# Patient Record
Sex: Female | Born: 1950 | Race: White | Hispanic: No | Smoking: Never smoker
Health system: Southern US, Community
[De-identification: ages and names within clinical notes are randomized; demographics above are authoritative.]

## PROBLEM LIST (undated history)

## (undated) DIAGNOSIS — D72819 Decreased white blood cell count, unspecified: Secondary | ICD-10-CM

## (undated) DIAGNOSIS — F329 Major depressive disorder, single episode, unspecified: Secondary | ICD-10-CM

## (undated) DIAGNOSIS — C9000 Multiple myeloma not having achieved remission: Secondary | ICD-10-CM

## (undated) DIAGNOSIS — F32A Depression, unspecified: Secondary | ICD-10-CM

## (undated) DIAGNOSIS — R51 Headache: Secondary | ICD-10-CM

## (undated) HISTORY — DX: Multiple myeloma not having achieved remission: C90.00

---

## 1977-02-17 HISTORY — PX: LAPAROSCOPIC ENDOMETRIOSIS FULGURATION: SUR769

## 2006-04-18 LAB — CONVERTED CEMR LAB: Pap Smear: NORMAL

## 2007-09-18 HISTORY — PX: CATARACT EXTRACTION: SUR2

## 2008-06-14 ENCOUNTER — Encounter: Payer: Self-pay | Admitting: Family Medicine

## 2008-06-24 ENCOUNTER — Emergency Department (HOSPITAL_BASED_OUTPATIENT_CLINIC_OR_DEPARTMENT_OTHER): Admission: EM | Admit: 2008-06-24 | Discharge: 2008-06-24 | Payer: Self-pay | Admitting: Emergency Medicine

## 2008-06-25 ENCOUNTER — Emergency Department (HOSPITAL_BASED_OUTPATIENT_CLINIC_OR_DEPARTMENT_OTHER): Admission: EM | Admit: 2008-06-25 | Discharge: 2008-06-25 | Payer: Self-pay | Admitting: Emergency Medicine

## 2008-12-19 ENCOUNTER — Encounter: Payer: Self-pay | Admitting: Family Medicine

## 2008-12-29 ENCOUNTER — Ambulatory Visit: Payer: Self-pay | Admitting: Family Medicine

## 2008-12-29 ENCOUNTER — Encounter: Payer: Self-pay | Admitting: Family Medicine

## 2008-12-29 ENCOUNTER — Other Ambulatory Visit: Admission: RE | Admit: 2008-12-29 | Discharge: 2008-12-29 | Payer: Self-pay | Admitting: Family Medicine

## 2008-12-29 DIAGNOSIS — G43909 Migraine, unspecified, not intractable, without status migrainosus: Secondary | ICD-10-CM | POA: Insufficient documentation

## 2009-01-01 LAB — CONVERTED CEMR LAB
ALT: 17 units/L (ref 0–35)
Albumin: 4.6 g/dL (ref 3.5–5.2)
BUN: 14 mg/dL (ref 6–23)
CO2: 23 meq/L (ref 19–32)
Calcium: 9.5 mg/dL (ref 8.4–10.5)
Chloride: 106 meq/L (ref 96–112)
Cholesterol, target level: 200 mg/dL
Cholesterol: 207 mg/dL — ABNORMAL HIGH (ref 0–200)
Creatinine, Ser: 0.7 mg/dL (ref 0.40–1.20)
HCT: 42.7 % (ref 36.0–46.0)
HDL goal, serum: 40 mg/dL
Hemoglobin: 13.6 g/dL (ref 12.0–15.0)
Platelets: 322 10*3/uL (ref 150–400)
Potassium: 4.3 meq/L (ref 3.5–5.3)
Total CHOL/HDL Ratio: 4.1
WBC: 4.9 10*3/uL (ref 4.0–10.5)

## 2009-01-02 LAB — CONVERTED CEMR LAB: Pap Smear: NORMAL

## 2009-01-08 ENCOUNTER — Encounter: Admission: RE | Admit: 2009-01-08 | Discharge: 2009-01-08 | Payer: Self-pay | Admitting: Family Medicine

## 2009-01-08 DIAGNOSIS — M810 Age-related osteoporosis without current pathological fracture: Secondary | ICD-10-CM | POA: Insufficient documentation

## 2009-03-07 ENCOUNTER — Ambulatory Visit: Payer: Self-pay | Admitting: Family Medicine

## 2010-03-19 NOTE — Assessment & Plan Note (Signed)
Summary: Cat bite - R thumb x this am rm 2   Vital Signs:  Patient Profile:   60 Years Old Female CC:      Cat bite - x this am Height:     70 inches Weight:      184 pounds O2 Sat:      100 % O2 treatment:    Room Air Temp:     97.3 degrees F oral Pulse rate:   64 / minute Pulse rhythm:   regular Resp:     16 per minute BP sitting:   127 / 81  (right arm) Cuff size:   regular  Vitals Entered By: Areta Haber CMA (March 07, 2009 6:53 PM)                  Prior Medication List:  DICYCLOMINE HCL 20 MG TABS (DICYCLOMINE HCL) Take 1 tab by mouth once daily as needed * HOLY BASIL  * SPLEEN ACTIVATOR  * PROLAMINE IODINE  * CARDIO PLUS  * MAGNESIUM  * GIA  * MANGANESE  * MULTIMINERAL  CALCIUM CARBONATE-VITAMIN D 600-200 MG-UNIT TABS (CALCIUM CARBONATE-VITAMIN D) Take 1 tablet by mouth two times a day BONIVA 150 MG TABS (IBANDRONATE SODIUM) one by mouth once a month   Current Allergies (reviewed today): ! * LATEX    History of Present Illness Chief Complaint: Cat bite - x this am History of Present Illness: Patient was bitten by her cat this morning. Patient cat has bitten patient before. His immunization are up to date according to patient. The patient immunizations are up to date.  Current Problems: BITE OF OTHER ANIMAL EXCEPT ARTHROPOD (ICD-E906.3) OSTEOPOROSIS (ICD-733.00) ROUTINE GYNECOLOGICAL EXAMINATION (ICD-V72.31) MIGRAINE HEADACHE (ICD-346.90)   Current Meds DICYCLOMINE HCL 20 MG TABS (DICYCLOMINE HCL) Take 1 tab by mouth once daily as needed * HOLY BASIL  * SPLEEN ACTIVATOR  * PROLAMINE IODINE  * CARDIO PLUS  * MAGNESIUM  * GIA  * MANGANESE  * MULTIMINERAL  CALCIUM CARBONATE-VITAMIN D 600-200 MG-UNIT TABS (CALCIUM CARBONATE-VITAMIN D) Take 1 tablet by mouth two times a day BONIVA 150 MG TABS (IBANDRONATE SODIUM) one by mouth once a month TREXIMET 85-500 MG TABS (SUMATRIPTAN-NAPROXEN SODIUM) as needed PROPRANOLOL HCL 10 MG TABS (PROPRANOLOL  HCL) 1 tab two times a day DICYCLOMINE HCL 20 MG TABS (DICYCLOMINE HCL) 1 tab by mouth qid as needed AUGMENTIN 875-125 MG TABS (AMOXICILLIN-POT CLAVULANATE) 1 by mouth 2 times daily  REVIEW OF SYSTEMS Constitutional Symptoms      Denies fever, chills, night sweats, weight loss, weight gain, and fatigue.  Eyes       Denies change in vision, eye pain, eye discharge, glasses, contact lenses, and eye surgery. Ear/Nose/Throat/Mouth       Denies hearing loss/aids, change in hearing, ear pain, ear discharge, dizziness, frequent runny nose, frequent nose bleeds, sinus problems, sore throat, hoarseness, and tooth pain or bleeding.  Respiratory       Denies dry cough, productive cough, wheezing, shortness of breath, asthma, bronchitis, and emphysema/COPD.  Cardiovascular       Denies murmurs, chest pain, and tires easily with exhertion.    Gastrointestinal       Denies stomach pain, nausea/vomiting, diarrhea, constipation, blood in bowel movements, and indigestion. Genitourniary       Denies painful urination, kidney stones, and loss of urinary control. Neurological       Denies paralysis, seizures, and fainting/blackouts. Musculoskeletal       Denies muscle pain, joint pain,  joint stiffness, decreased range of motion, redness, swelling, muscle weakness, and gout.  Skin       Denies bruising, unusual mles/lumps or sores, and hair/skin or nail changes.      Comments: Cat bite this am- R thumb Psych       Denies mood changes, temper/anger issues, anxiety/stress, speech problems, depression, and sleep problems. Other Comments: Pt states that she is update with her Tdap.   Past History:  Past Surgical History: Last updated: 12/29/2008 Endometriosis 1979 Cataract 09/2007  Family History: Last updated: 12/29/2008 Cancer, MI Sister with hi chol, HTN GM and aunt with brCa.   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  Never Smoked Alcohol use-no Drug use-no Regular exercise-yes  Risk Factors: Exercise: yes (12/29/2008)  Risk Factors: Smoking Status: never (12/29/2008)  Family History: Reviewed history from 12/29/2008 and no changes required. Cancer, MI Sister with hi chol, HTN GM and aunt with brCa.   Father with colon adenoma  Social History: Reviewed history from 12/29/2008 and no changes required. 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  Never Smoked Alcohol use-no Drug use-no Regular exercise-yes Physical Exam General appearance: well developed, well nourished, no acute distress Head: normocephalic, atraumatic Extremities: Puncture wound w/ swelling and claw marks on the R forearm. Skin: no obvious rashes or lesions MSE: oriented to time, place, and person Assessment New Problems: BITE OF OTHER ANIMAL EXCEPT ARTHROPOD (ICD-E906.3)  cat bite  Plan New Medications/Changes: AUGMENTIN 875-125 MG TABS (AMOXICILLIN-POT CLAVULANATE) 1 by mouth 2 times daily  #20 x 0, 03/07/2009, Hassan Rowan MD  New Orders: New Patient Level III [phone] Planning Comments:   as below  Follow Up: Follow up in 2-3 days if no improvement, Follow up on an as needed basis, Follow up with Primary Physician  The patient and/or caregiver has been counseled thoroughly with regard to medications prescribed including dosage, schedule, interactions, rationale for use, and possible side effects and they verbalize understanding.  Diagnoses and expected course of recovery discussed and will return if not improved as expected or if the condition worsens. Patient and/or caregiver verbalized understanding.  Prescriptions: AUGMENTIN 875-125 MG TABS (AMOXICILLIN-POT CLAVULANATE) 1 by mouth 2 times daily  #20 x 0   Entered and Authorized by:   Hassan Rowan MD   Signed by:   Hassan Rowan MD on 03/07/2009   Method used:   Printed then faxed to ...        [facility] [address] # [phone]* (retail)       [address] [place], [place]  [address]       Ph: [phone]       Fax: [phone]   RxID:   [id number]   Patient Instructions: 1)  Please schedule a follow-up appointment as needed. 2)  Please schedule an appointment with your primary doctor in :as needed 3)  Take your antibiotic as prescribed until ALL of it is gone, but stop if you develop a rash or swelling and contact our office as soon as possible.

## 2010-05-14 ENCOUNTER — Telehealth: Payer: Self-pay | Admitting: Family Medicine

## 2010-05-14 NOTE — Telephone Encounter (Signed)
Nancy Parks with [employer] is requesting pt's BMI from 12/29/2008 OV, his phone # is [phone] his fax is [phone]

## 2010-05-20 NOTE — Telephone Encounter (Signed)
This info has been faxed already

## 2010-07-12 ENCOUNTER — Inpatient Hospital Stay (INDEPENDENT_AMBULATORY_CARE_PROVIDER_SITE_OTHER)
Admission: RE | Admit: 2010-07-12 | Discharge: 2010-07-12 | Disposition: A | Discharge status: Home / Self Care | Payer: Managed Care, Other (non HMO) | Source: Ambulatory Visit | Attending: Family Medicine | Admitting: Family Medicine

## 2010-07-12 ENCOUNTER — Encounter: Payer: Self-pay | Admitting: Family Medicine

## 2010-07-12 DIAGNOSIS — Z23 Encounter for immunization: Secondary | ICD-10-CM

## 2010-07-12 DIAGNOSIS — S60229A Contusion of unspecified hand, initial encounter: Secondary | ICD-10-CM

## 2010-07-12 DIAGNOSIS — IMO0001 Reserved for inherently not codable concepts without codable children: Secondary | ICD-10-CM

## 2010-07-14 ENCOUNTER — Telehealth (INDEPENDENT_AMBULATORY_CARE_PROVIDER_SITE_OTHER): Payer: Self-pay | Admitting: Emergency Medicine

## 2010-07-16 ENCOUNTER — Telehealth (INDEPENDENT_AMBULATORY_CARE_PROVIDER_SITE_OTHER): Payer: Self-pay | Admitting: *Deleted

## 2011-01-20 NOTE — Telephone Encounter (Signed)
  Phone Note Outgoing Call   Call placed by: Lavell Islam RN,  Jul 14, 2010 5:47 PM Call placed to: Patient Action Taken: Phone Call Completed Summary of Call: Left message inquiring about patient's cat bite area; encouraged her to call us with any questions/concerns. Initial call taken by: Lavell Islam RN,  Jul 14, 2010 5:48 PM

## 2011-01-20 NOTE — Progress Notes (Signed)
Summary: right hand cat bite rm 5   Vital Signs:  Patient Profile:   60 Years Old Female CC:      cat bite to RT hand x today Height:     70 inches Weight:      184.50 pounds O2 Sat:      97 % O2 treatment:    Room Air Temp:     98.7 degrees F oral Pulse rate:   89 / minute Resp:     18 per minute BP sitting:   128 / 77  (left arm) Cuff size:   regular  Vitals Entered By: Clemens Catholic LPN (Jul 12, 2010 4:10 PM)                  Updated Prior Medication List: * HOLY BASIL  * SPLEEN ACTIVATOR  * PROLAMINE IODINE  * CARDIO PLUS  * MAGNESIUM  * GIA  * MANGANESE  * MULTIMINERAL  CALCIUM CARBONATE-VITAMIN D 600-200 MG-UNIT TABS (CALCIUM CARBONATE-VITAMIN D) Take 1 tablet by mouth two times a day BONIVA 150 MG TABS (IBANDRONATE SODIUM) one by mouth once a month  Current Allergies (reviewed today): ! * LATEXHistory of Present Illness Chief Complaint: cat bite to RT hand x today History of Present Illness:  Subjective:  Patient was handling her cat this morning and startled him; he bit her on her right wrist.  She washed the area and applied antibiotic, but the area later became red and tender.  She has had some tingling in her right hand.  The cat has not been ill.  REVIEW OF SYSTEMS Constitutional Symptoms      Denies fever, chills, night sweats, weight loss, weight gain, and fatigue.  Eyes       Denies change in vision, eye pain, eye discharge, glasses, contact lenses, and eye surgery. Ear/Nose/Throat/Mouth       Denies hearing loss/aids, change in hearing, ear pain, ear discharge, dizziness, frequent runny nose, frequent nose bleeds, sinus problems, sore throat, hoarseness, and tooth pain or bleeding.  Respiratory       Denies dry cough, productive cough, wheezing, shortness of breath, asthma, bronchitis, and emphysema/COPD.  Cardiovascular       Denies murmurs, chest pain, and tires easily with exhertion.    Gastrointestinal       Denies stomach pain,  nausea/vomiting, diarrhea, constipation, blood in bowel movements, and indigestion. Genitourniary       Denies painful urination, kidney stones, and loss of urinary control. Neurological       Denies paralysis, seizures, and fainting/blackouts. Musculoskeletal       Complains of redness and swelling.      Denies muscle pain, joint pain, joint stiffness, decreased range of motion, muscle weakness, and gout.  Skin       Denies bruising, unusual mles/lumps or sores, and hair/skin or nail changes.  Psych       Denies mood changes, temper/anger issues, anxiety/stress, speech problems, depression, and sleep problems. Other Comments: pt states that her cat bit her RT hand/ wrist area this morning. she has redness and a little swelling around the area.she has some tingling in her hand.  her TD is update per the pt.  Reported to the [facility] Control (Hank Sowers).   Past History:  Past Surgical History: Reviewed history from 12/29/2008 and no changes required. Endometriosis 1979 Cataract 09/2007  Family History: Cancer, MI, CVA Sister with hi chol, HTN, throat CA GM and aunt with brCa.   Father  with colon adenoma  Social History: Reviewed history from 12/29/2008 and no changes required. 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  Never Smoked Alcohol use-no Drug use-no Regular exercise-yes   Objective:  Appearance:  Patient appears healthy, stated age, and in no acute distress  Right wrist:  2 small abrasions on dorsum with surrounding erythema and tenderness.  Proximal to these lesions is a small abrasion on forearm without erythema.  Wrist has full range of motion.  Distal neurovascular intact  Assessment New Problems: CAT BITE (ICD-E906.3)   Plan New Medications/Changes: AUGMENTIN 875-125 MG TABS (AMOXICILLIN-POT CLAVULANATE) One by mouth q12hr pc  #14 x 0, 07/12/2010, Neya Christen MD  New Orders: Tdap => 22yrs IM  [90715] Est. Patient Level III [99213] Admin of Injection (IM/SQ) [14782] Admin 1st Vaccine [90471] Planning Comments:   Applied Bacitracin and bandage.  Begin Augmentin for one week.  Tdap given.  May take Ibuprofen 200mg , 4 tabs every 8 hours with food.  Change bandage daily until healed. Animal control contacted Return if not improving 2 to 3 days.   The patient and/or caregiver has been counseled thoroughly with regard to medications prescribed including dosage, schedule, interactions, rationale for use, and possible side effects and they verbalize understanding.  Diagnoses and expected course of recovery discussed and will return if not improved as expected or if the condition worsens. Patient and/or caregiver verbalized understanding.  Prescriptions: AUGMENTIN 875-125 MG TABS (AMOXICILLIN-POT CLAVULANATE) One by mouth q12hr pc  #14 x 0   Entered and Authorized by:   Ricquel Christen MD   Signed by:   Celsa Christen MD on 07/12/2010   Method used:   Print then Give to Patient   RxID:   [id number]   Patient Instructions: 1)  Take Ibuprofen 200mg , 4 tabs every 8 hours with food  2)  Apply Bacitracin and bandage daily for several days until healed.  Orders Added: 1)  Tdap => 55yrs IM [90715] 2)  Est. Patient Level III [29528] 3)  Admin of Injection (IM/SQ) [41324] 4)  Admin 1st Vaccine [40102]   Immunizations Administered:  Tetanus Vaccine:    Vaccine Type: Tdap    Site: right deltoid    Mfr: GlaxoSmithKline    Dose: 0.5 ml    Route: IM    Given by: Clemens Catholic LPN    Exp. Date: 12/07/2011    Lot #: [id number]    VIS given: 01/05/08 version given Jul 12, 2010.   Immunizations Administered:  Tetanus Vaccine:    Vaccine Type: Tdap    Site: right deltoid    Mfr: GlaxoSmithKline    Dose: 0.5 ml    Route: IM    Given by: Clemens Catholic LPN    Exp. Date: 12/07/2011    Lot #: [id number]    VIS given: 01/05/08 version given Jul 12, 2010.

## 2011-01-20 NOTE — Telephone Encounter (Signed)
  Phone Note Call from Patient   Caller: Patient Summary of Call: Patient called reporting mild tingling in her 3rd and 4th fingers for a couple days. After talking with Dr. Orson Aloe I told her it was probably from the swelling. Use ice, elevation and ibuprofen. She reports she is still taking the antibiotic and the redness is decreasing. Call back if tingling does not subside. Patient understood. Initial call taken by: Lajean Saver RN,  Jul 16, 2010 9:23 AM

## 2011-02-04 ENCOUNTER — Ambulatory Visit (INDEPENDENT_AMBULATORY_CARE_PROVIDER_SITE_OTHER): Payer: Managed Care, Other (non HMO) | Admitting: Family Medicine

## 2011-02-04 ENCOUNTER — Encounter: Payer: Self-pay | Admitting: Family Medicine

## 2011-02-04 DIAGNOSIS — F32A Depression, unspecified: Secondary | ICD-10-CM | POA: Insufficient documentation

## 2011-02-04 DIAGNOSIS — F329 Major depressive disorder, single episode, unspecified: Secondary | ICD-10-CM

## 2011-02-04 MED ORDER — CITALOPRAM HYDROBROMIDE 20 MG PO TABS: ORAL_TABLET | ORAL | Status: DC

## 2011-02-04 MED ORDER — ALPRAZOLAM 0.5 MG PO TABS: 0.5000 mg | ORAL_TABLET | Freq: Every evening | ORAL | Status: AC | PRN

## 2011-02-04 NOTE — Progress Notes (Signed)
  Subjective:    Patient ID: Nancy Parks, female    DOB: 16-Feb-1951, 60 y.o.   MRN: [id number]  HPI Started in early November. Says husband left her in May but then in November found out husband was cheating for 10 years.  Had been married for 20 years. Started seeing a counselor and they recommended medication. Having a hard time focusing. She is a Patent examiner for a living. More migraines when she has to deal with him. She is not suicidal.  She is now lonely and struggling financially. She feels shakey inside.  Not sleep well. Waking up frequently.  Dec energy but racing thoughts. Does have friend support.    Review of Systems     Objective:   Physical Exam  Constitutional: She is oriented to person, place, and time. She appears well-developed and well-nourished.  HENT:  Head: Normocephalic and atraumatic.  Cardiovascular: Normal rate, regular rhythm and normal heart sounds.   Pulmonary/Chest: Effort normal and breath sounds normal.  Neurological: She is alert and oriented to person, place, and time.  Skin: Skin is warm and dry.  Psychiatric: She has a normal mood and affect. Her behavior is normal.          Assessment & Plan:  Acute situational depressoin- It sounds like she is really struggling right now. She denies any suicidal thoughts. Her PHQ 9 score is 23 today. This is severe depression. We discussed treatment including medications. She has never taken a mood medication. She would also like a medicine to help with acute situations when she has to deal with her husband. They're currently working with a Clinical research associate for the divorce. We discussed starting an SSRI discussed the potential side effects. We will start with citalopram 20 mg. She will start with half a tab once a day for one week and then increase to a whole tablet. I also gave her a short prescription for Xanax to use for rescue only. Since she is nieve to benzo medications I recommended that she start with half of a tab.  Followup in 3-4 weeks. I asked her to continue her counseling as I think that this is where she will do most of the work to heal from her recent separation. 25 minutes spent face-to-face in counseling.

## 2011-02-18 HISTORY — PX: BASAL CELL CARCINOMA EXCISION: SHX1214

## 2011-03-04 ENCOUNTER — Encounter: Payer: Self-pay | Admitting: Family Medicine

## 2011-03-04 ENCOUNTER — Ambulatory Visit (INDEPENDENT_AMBULATORY_CARE_PROVIDER_SITE_OTHER): Payer: Managed Care, Other (non HMO) | Admitting: Family Medicine

## 2011-03-04 ENCOUNTER — Other Ambulatory Visit (HOSPITAL_COMMUNITY)
Admission: RE | Admit: 2011-03-04 | Discharge: 2011-03-04 | Disposition: A | Discharge status: Home / Self Care | Payer: Managed Care, Other (non HMO) | Source: Ambulatory Visit | Attending: Family Medicine | Admitting: Family Medicine

## 2011-03-04 VITALS — BP 122/73 | HR 65 | Temp 98.0°F | Wt 165.0 lb

## 2011-03-04 DIAGNOSIS — Z01419 Encounter for gynecological examination (general) (routine) without abnormal findings: Secondary | ICD-10-CM | POA: Insufficient documentation

## 2011-03-04 DIAGNOSIS — Z1159 Encounter for screening for other viral diseases: Secondary | ICD-10-CM | POA: Insufficient documentation

## 2011-03-04 MED ORDER — CITALOPRAM HYDROBROMIDE 20 MG PO TABS: 20.0000 mg | ORAL_TABLET | Freq: Every day | ORAL | Status: DC

## 2011-03-04 NOTE — Patient Instructions (Signed)
Start a regular exercise program and make sure you are eating a healthy diet Try to eat 4 servings of dairy a day or take a calcium supplement (500mg twice a day). Your vaccines are up to date.   

## 2011-03-04 NOTE — Progress Notes (Signed)
Addended by: Wyline Beady on: 03/04/2011 01:36 PM   Modules accepted: Orders

## 2011-03-04 NOTE — Progress Notes (Signed)
  Subjective:     Nancy Parks is a 61 y.o. female and is here for a comprehensive physical exam. The patient reports no problems.  History   Social History  . Marital Status: Married    Spouse Name: N/A    Number of Children: N/A  . Years of Education: N/A   Occupational History  . accountant   . Life coach    Social History Main Topics  . Smoking status: Never Smoker   . Smokeless tobacco: Not on file  . Alcohol Use: No  . Drug Use: No  . Sexually Active: Yes -- Female partner(s)   Other Topics Concern  . Not on file   Social History Narrative  . No narrative on file   Health Maintenance  Topic Date Due  . Zostavax  03/31/2010  . Mammogram  01/09/2011  . Influenza Vaccine  11/18/2011  . Pap Smear  03/03/2014  . Colonoscopy  02/18/2019  . Tetanus/tdap  07/11/2020    The following portions of the patient's history were reviewed and updated as appropriate: allergies, current medications, past family history, past medical history, past social history, past surgical history and problem list.  Review of Systems A comprehensive review of systems was negative.   Objective:    BP 122/73  Pulse 65  Temp(Src) 98 F (36.7 C) (Oral)  Wt 165 lb (74.844 kg)  SpO2 98% General appearance: alert, cooperative and appears stated age Head: Normocephalic, without obvious abnormality, atraumatic Eyes: conj clear, EOMi, PEERLA Ears: normal TM's and external ear canals both ears Nose: Nares normal. Septum midline. Mucosa normal. No drainage or sinus tenderness. Throat: lips, mucosa, and tongue normal; teeth and gums normal Neck: no adenopathy, no carotid bruit, no JVD, supple, symmetrical, trachea midline and thyroid not enlarged, symmetric, no tenderness/mass/nodules Back: symmetric, no curvature. ROM normal. No CVA tenderness. Lungs: clear to auscultation bilaterally Breasts: normal appearance, no masses or tenderness Heart: regular rate and rhythm, S1, S2 normal, no murmur, click,  rub or gallop Abdomen: soft, non-tender; bowel sounds normal; no masses,  no organomegaly Pelvic: cervix normal in appearance, external genitalia normal, no adnexal masses or tenderness, no cervical motion tenderness, rectovaginal septum normal, uterus normal size, shape, and consistency and vagina normal without discharge Extremities: extremities normal, atraumatic, no cyanosis or edema Pulses: 2+ and symmetric Skin: Skin color, texture, turgor normal. No rashes or lesions Lymph nodes: Cervical, supraclavicular, and axillary nodes normal. Neurologic: Grossly normal    Assessment:    Healthy female exam.      Plan:     See After Visit Summary for Counseling Recommendations  Start a regular exercise program and make sure you are eating a healthy diet Try to eat 4 servings of dairy a day or take a calcium supplement (500mg  twice a day). Your vaccines are up to date.   Depresion  PHQ-9 socre of 6, down from 25. Great results.Meds refilled. F/U in 3 months.

## 2011-03-05 LAB — LIPID PANEL
HDL: 55 mg/dL (ref >39)
LDL Cholesterol: 105 mg/dL — ABNORMAL HIGH (ref 0–99)
Total CHOL/HDL Ratio: 3.3 Ratio
Triglycerides: 96 mg/dL (ref <150)
VLDL: 19 mg/dL (ref 0–40)

## 2011-03-05 LAB — COMPLETE METABOLIC PANEL WITH GFR
ALT: 17 U/L (ref 0–35)
AST: 20 U/L (ref 0–37)
Alkaline Phosphatase: 78 U/L (ref 39–117)
CO2: 25 mEq/L (ref 19–32)
Creat: 0.59 mg/dL (ref 0.50–1.10)
GFR, Est African American: >89 mL/min
Sodium: 139 mEq/L (ref 135–145)
Total Bilirubin: 0.7 mg/dL (ref 0.3–1.2)
Total Protein: 7 g/dL (ref 6.0–8.3)

## 2011-05-30 ENCOUNTER — Other Ambulatory Visit: Payer: Self-pay | Admitting: Family Medicine

## 2011-06-02 ENCOUNTER — Encounter: Payer: Self-pay | Admitting: *Deleted

## 2011-06-02 ENCOUNTER — Ambulatory Visit: Payer: Managed Care, Other (non HMO) | Admitting: Family Medicine

## 2011-06-05 ENCOUNTER — Encounter: Payer: Self-pay | Admitting: Family Medicine

## 2011-06-05 ENCOUNTER — Ambulatory Visit (INDEPENDENT_AMBULATORY_CARE_PROVIDER_SITE_OTHER): Payer: Managed Care, Other (non HMO) | Admitting: Family Medicine

## 2011-06-05 VITALS — BP 130/66 | HR 96 | Ht 66.0 in | Wt 165.0 lb

## 2011-06-05 DIAGNOSIS — F329 Major depressive disorder, single episode, unspecified: Secondary | ICD-10-CM

## 2011-06-05 DIAGNOSIS — F341 Dysthymic disorder: Secondary | ICD-10-CM

## 2011-06-05 DIAGNOSIS — M25569 Pain in unspecified knee: Secondary | ICD-10-CM

## 2011-06-05 DIAGNOSIS — M25562 Pain in left knee: Secondary | ICD-10-CM

## 2011-06-05 NOTE — Progress Notes (Signed)
  Subjective:    Patient ID: Nancy Parks, female    DOB: 09-11-1950, 61 y.o.   MRN: [id number]  HPI Acute situational depression  - Says doing well overall. Happy with her current regimen.  Next 6 week will be really stressed. She will be signing her separation papers.    Migraine are better on the amitryptiline and is doing well.  Had more HA the first 2 weeks after starting the medication.  Doing well now.   2 week ago hurt her knee after squatting frequently.  Both were swollen the next day.  Left knee is still sore and swollen. Swollen more laterally.  Right knee is a little better.  Has been doing some stretches.  Took and IBU a few times.  Castor oil packs helped as well.  Icing it some.  Worse with full extension.      Review of Systems     Objective:   Physical Exam  Constitutional: She is oriented to person, place, and time. She appears well-developed and well-nourished.  HENT:  Head: Normocephalic and atraumatic.  Cardiovascular: Normal rate, regular rhythm and normal heart sounds.   Pulmonary/Chest: Effort normal and breath sounds normal.  Neurological: She is alert and oriented to person, place, and time.  Skin: Skin is warm and dry.  Psychiatric: She has a normal mood and affect. Her behavior is normal.   Left knee with trace edema diffusely. She has significant edema over the left lateral edge in half moon shape. She is tender over the swollen areas but not over the joint lines. She has significant amount of crepitus with flexion and extension. There is no increased laxity of joint compared to her right. There slight increased warmth. Knee strength is 5 over 5. No ankle edema.  Neg Mcmurrays.         Assessment & Plan:  Acute situational depression - PHQ- 9 score o f 4.  Doing very wel. Discussed options. Will continue current regimen since at therapwutic goal. F/U in 4 months. If dong well at that time may be able to wean down. She will be moving to [place] once she is  able to sell her home.  Left knee pain- question whether or not she may have a partial cartilage tear especially with such abrupt onset of swelling. At this point in time she prefers conservative therapy so recommend use of anti-inflammatories and icing as needed. Overall the knee is better than it was 2 weeks ago continue with conservative care. Would also consider a steroid injection but she is not interested at this time. I went over using the knee or over walking.  I am very happy that her migraines are under much better control with her neurologist at [facility] with increased stress in her life right now.

## 2011-07-28 ENCOUNTER — Other Ambulatory Visit: Payer: Self-pay | Admitting: Family Medicine

## 2011-07-31 ENCOUNTER — Telehealth: Payer: Self-pay | Admitting: *Deleted

## 2011-07-31 NOTE — Telephone Encounter (Signed)
Pt states that she got scratched by her cat and would like to know if you will call in an abx. States she has cleaned it and it is red and swollen. States this happened a year ago in May. Please advise.

## 2011-07-31 NOTE — Telephone Encounter (Signed)
Needs appt tomorrow

## 2011-08-01 NOTE — Telephone Encounter (Signed)
LM on mobile and home phone for pt to call back.

## 2011-08-01 NOTE — Telephone Encounter (Signed)
Pt states that she is out of town in [place] and will be gone for 7 days. States she is keeping it clean and soaking it. Informed pt she could also put triple abx ointment on it and if it was no better when she got back to make appt.

## 2011-09-18 ENCOUNTER — Encounter: Payer: Self-pay | Admitting: Family Medicine

## 2011-09-18 ENCOUNTER — Ambulatory Visit (INDEPENDENT_AMBULATORY_CARE_PROVIDER_SITE_OTHER): Payer: Managed Care, Other (non HMO) | Admitting: Family Medicine

## 2011-09-18 VITALS — BP 116/77 | HR 86 | Wt 170.0 lb

## 2011-09-18 DIAGNOSIS — F438 Other reactions to severe stress: Secondary | ICD-10-CM

## 2011-09-18 DIAGNOSIS — G43909 Migraine, unspecified, not intractable, without status migrainosus: Secondary | ICD-10-CM

## 2011-09-18 DIAGNOSIS — F4389 Other reactions to severe stress: Secondary | ICD-10-CM

## 2011-09-18 DIAGNOSIS — F430 Acute stress reaction: Secondary | ICD-10-CM

## 2011-09-18 MED ORDER — AMITRIPTYLINE HCL 50 MG PO TABS: 50.0000 mg | ORAL_TABLET | Freq: Every day | ORAL | Status: DC

## 2011-09-18 NOTE — Progress Notes (Signed)
  Subjective:    Patient ID: Nancy Parks, female    DOB: 1950-10-24, 61 y.o.   MRN: [id number]  HPI Acute situation depression - Says divorce hasn't gone through.  Still trying to sell her house but it is not selling. Still wants to move the [place].  Having a hard time lately, esp when saw her ex husband out in public.  Migraines have been worse the whole month of July. They were previously well controlled. She says she's had very few days where she has not had a headache. She has been taking her amitriptyline 25 mg to bedtime and has been using her treximet for rescue. She normally follows with a neurologist for her headaches. She was supposed to call to schedule followup sometime in August.    Review of Systems     Objective:   Physical Exam  Constitutional: She is oriented to person, place, and time. She appears well-developed and well-nourished.  HENT:  Head: Normocephalic and atraumatic.  Cardiovascular: Normal rate, regular rhythm and normal heart sounds.   Pulmonary/Chest: Effort normal and breath sounds normal.  Neurological: She is alert and oriented to person, place, and time.  Skin: Skin is warm and dry.  Psychiatric: She has a normal mood and affect. Her behavior is normal.          Assessment & Plan:  Acute situational depression - Continue current regimen.  PHQ- 9 score of 2.  She does not want to wean the medication at this point time. Followup in 2 months. Call she has any problems. I did discuss with her the importance of considering counseling. She does have some good support. She said she would at least try going. Will refer to [employer].  Migraines - most likely exacerbated by her recent stressors. Continue work on getting good quality sleep, staying hydrated, and avoiding caffeine. Will inc amitryptiline to 50mg  at bedtime. If this does not help with her migraines the next couple weeks and recommend followup with her neurologist who is normally who manages  her migraines.Marland Kitchen

## 2011-09-22 ENCOUNTER — Other Ambulatory Visit: Payer: Self-pay | Admitting: Family Medicine

## 2011-10-02 ENCOUNTER — Ambulatory Visit (INDEPENDENT_AMBULATORY_CARE_PROVIDER_SITE_OTHER): Payer: 59 | Admitting: Psychology

## 2011-10-02 DIAGNOSIS — F331 Major depressive disorder, recurrent, moderate: Secondary | ICD-10-CM | POA: Insufficient documentation

## 2011-10-02 DIAGNOSIS — F329 Major depressive disorder, single episode, unspecified: Secondary | ICD-10-CM

## 2011-10-02 NOTE — Patient Instructions (Signed)
1- Please consider the problems you would like to address and the goals you would like to work on in therapy to address the problems.
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
## 2011-10-03 ENCOUNTER — Encounter (HOSPITAL_COMMUNITY): Payer: Self-pay | Admitting: Psychology

## 2011-10-09 ENCOUNTER — Ambulatory Visit (INDEPENDENT_AMBULATORY_CARE_PROVIDER_SITE_OTHER): Payer: 59 | Admitting: Psychology

## 2011-10-09 DIAGNOSIS — F331 Major depressive disorder, recurrent, moderate: Secondary | ICD-10-CM

## 2011-10-14 ENCOUNTER — Ambulatory Visit (INDEPENDENT_AMBULATORY_CARE_PROVIDER_SITE_OTHER): Payer: 59 | Admitting: Psychology

## 2011-10-14 DIAGNOSIS — F331 Major depressive disorder, recurrent, moderate: Secondary | ICD-10-CM

## 2011-10-14 NOTE — Patient Instructions (Signed)
1- The marriage was not in vain. Accept the positive and enjoyable aspects of your marriage. 2- Be sure to set healthy boundaries around work schedules and self-care.

## 2011-10-15 ENCOUNTER — Encounter (HOSPITAL_COMMUNITY): Payer: Self-pay | Admitting: Psychology

## 2011-10-15 NOTE — Progress Notes (Signed)
   THERAPIST PROGRESS NOTE  Session Time: 1100- 1155 am  Participation Level: Active  Behavioral Response: NeatAlertEuthymic  Type of Therapy: Individual Therapy  Treatment Goals addressed: Anger, Anxiety, Communication: thoughts and feelings and Coping  Interventions: Solution Focused, Strength-based, Psychosocial Skills: coping, understanding next steps in her life, anger and Supportive  Summary: Nancy Parks is a 61 y.o. female who presents as pleasant and easily engaged. She has been struggling with her feelings related to her separation, feeling stuck in the area, and at her estranged husband for hurting her so deeply.  The patient shared that she so badly wants to move on with her life by [place] [place] but that move cannot happen until she has sold her home.  She finds herself looking for her estranged husband at the places she previously was glad to see his police car.  Instead of feeling free in the city she has lived for years she feels closed in because of the fear of seeing her husband and of her heart hurting all over again.  I talked with the patient about her need to consider that perhaps her home will not sell and that she will not be 'released' to move until she has moved on with her life right where she is.  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  She reports removing all her energy from the home by packing things up and tearing down her 'healing room' where all the healing work was done.  I suggested that she might consider now that it is her home and not her and Dean's and that it would more likely reflect her and not the married Ree.  It was apparent the patient was taking this in as she was thinking and writing notes.  She felt very injured by her husband's affair and has lost her ability to trust.  I asked the patient to consider the  characteristics of a person that she would allow in her life and characteristics that she would not permit in someone in her life.  The patient is to consider this and return next session with a list, from the list she is to mark which positive characteristics her estranged husband possesses.  Suicidal/Homicidal: No  Plan: Return again in 1 weeks.  Diagnosis: Axis I: Major depressive disorder    Axis II: No diagnosis    [name], [facility] 10/15/2011

## 2011-10-16 ENCOUNTER — Encounter (HOSPITAL_COMMUNITY): Payer: Self-pay | Admitting: Psychology

## 2011-10-16 NOTE — Progress Notes (Signed)
   THERAPIST PROGRESS NOTE  Session Time: 205- 305 pm  Participation Level: Active  Behavioral Response: NeatAlertEuthymic  Type of Therapy: Individual Therapy  Treatment Goals addressed: Coping  Interventions: Solution Focused, Strength-based, Psychosocial Skills: coping, looking to the future and Supportive  Summary: Nancy Parks is a 61 y.o. female who presents as pleasant and easily engaged.  She came with her homework completed and reports she is doing so much better.  The patient reports the information provided by this therapist has been helpful for her to evaluate her life and know that she can move forward.  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  She thanked this counselor for supporting her healing work and not putting it down.  The patient developed a lengthy list of the characteristics she desires in people she will allow in her life.  She realized after completing this activity that her estranged husband currently only possesses two of those characteristics and that she doesn't want him in her life at this time.  She recognized that at the beginning of their relationship they were very much in sync but after their years together they no longer wanted the same things.  She admits the illnesses of both of their respective parents caused them to spend less time together and they never really recovered the level of intimacy they previously experienced in their emotional relationship.  She feels ready to moved forward with her life and will not sit and wait for her home to sell.  Suicidal/Homicidal: No  Plan: Return again in 2 weeks.  Diagnosis: Axis I: Major depressive disorder, recurrent    Axis II: No diagnosis    [name], [facility] 10/16/2011

## 2011-10-23 ENCOUNTER — Other Ambulatory Visit: Payer: Self-pay | Admitting: Family Medicine

## 2011-10-28 ENCOUNTER — Ambulatory Visit (HOSPITAL_COMMUNITY): Payer: Self-pay | Admitting: Psychology

## 2011-10-31 ENCOUNTER — Ambulatory Visit (INDEPENDENT_AMBULATORY_CARE_PROVIDER_SITE_OTHER): Payer: 59 | Admitting: Psychology

## 2011-10-31 DIAGNOSIS — F331 Major depressive disorder, recurrent, moderate: Secondary | ICD-10-CM

## 2011-11-06 ENCOUNTER — Encounter (HOSPITAL_COMMUNITY): Payer: Self-pay | Admitting: Psychology

## 2011-11-06 NOTE — Progress Notes (Signed)
   THERAPIST PROGRESS NOTE  Session Time: 1103 am- 1209 pm  Participation Level: Active  Behavioral Response: NeatAlertEuthymic  Type of Therapy: Individual Therapy  Treatment Goals addressed: Communication: her thoughts and feelings and Coping  Interventions: Solution Focused, Strength-based, Psychosocial Skills: coping, patience in life change and Supportive  Summary: Nancy Parks is a 61 y.o. female who presents as pleasant and easily engaged.  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  Her friends are hopeful that she will end up staying in the area and that she will not move and she doesn't see this happening.  She has had a few showings on her home but gets discouraged when she reads the comments because no one is interested in purchasing the home.  She knows that she needs to work on letting the process unfold and being patient but admittedly struggles with this.  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  Suicidal/Homicidal: No  Plan: Return again in 2 weeks.  Diagnosis: Axis I: Major depressive disorder, recurrent    Axis II: No diagnosis    [name], [facility] 11/06/2011

## 2011-11-18 ENCOUNTER — Ambulatory Visit (INDEPENDENT_AMBULATORY_CARE_PROVIDER_SITE_OTHER): Payer: Managed Care, Other (non HMO) | Admitting: Family Medicine

## 2011-11-18 ENCOUNTER — Encounter: Payer: Self-pay | Admitting: Family Medicine

## 2011-11-18 VITALS — BP 130/80 | HR 93 | Ht 70.0 in | Wt 176.0 lb

## 2011-11-18 DIAGNOSIS — F3289 Other specified depressive episodes: Secondary | ICD-10-CM

## 2011-11-18 DIAGNOSIS — F32A Depression, unspecified: Secondary | ICD-10-CM

## 2011-11-18 DIAGNOSIS — G43909 Migraine, unspecified, not intractable, without status migrainosus: Secondary | ICD-10-CM

## 2011-11-18 DIAGNOSIS — F329 Major depressive disorder, single episode, unspecified: Secondary | ICD-10-CM

## 2011-11-18 MED ORDER — CITALOPRAM HYDROBROMIDE 20 MG PO TABS: 20.0000 mg | ORAL_TABLET | Freq: Every day | ORAL | Status: DC

## 2011-11-18 NOTE — Progress Notes (Addendum)
  Subjective:    Patient ID: Nancy Parks, female    DOB: 11/29/1950, 61 y.o.   MRN: [id number]  HPI Acute situations depression-she feels overall she's doing well. She's is 95% time she is happy. She has started seeing Olegario Messier downstairs has had 3 visits so far and has scheduled a followup. She is sleeping well most nights. She really only had one day where she felt severely depressed but felt better by the next day.  Migraines-much improved. She says she's tolerating the increase in amitriptyline well. Now she is only having to 3 migraines per month versus every other day. Her stress is also much less than she's doing with her divorce a little bit better. They're still trying to sell her house. She eventually would like to move to [place].    Review of Systems     Objective:   Physical Exam  Constitutional: She is oriented to person, place, and time. She appears well-developed and well-nourished.  HENT:  Head: Normocephalic and atraumatic.  Cardiovascular: Normal rate, regular rhythm and normal heart sounds.   Pulmonary/Chest: Effort normal and breath sounds normal.  Neurological: She is alert and oriented to person, place, and time.  Skin: Skin is warm and dry.  Psychiatric: She has a normal mood and affect. Her behavior is normal.          Assessment & Plan:  Acute situations depression- PHQ 9 score of 1. The score of one was for overeating. Overall she's doing very well. She's seeing Olegario Messier downstairs which I think is fantastic and encouraged her to keep this up. We will continue her current regimen. Refills for citalopram sent to the pharmacy. Followup in 3 months in January. Call if having any acute problems.  Migraines-overall much improved. She was having headaches every other day now she's only having 2-3 per month. We will continue her current regimen on amitriptyline and continue the current dose. If her headaches start to get worse then please call me or consider following  up with her neurologist.  Declined mammo  Declined flu shot.

## 2011-11-20 ENCOUNTER — Encounter (HOSPITAL_COMMUNITY): Payer: Self-pay | Admitting: Psychology

## 2011-11-20 ENCOUNTER — Ambulatory Visit (INDEPENDENT_AMBULATORY_CARE_PROVIDER_SITE_OTHER): Payer: 59 | Admitting: Psychology

## 2011-11-20 DIAGNOSIS — F331 Major depressive disorder, recurrent, moderate: Secondary | ICD-10-CM

## 2011-11-20 NOTE — Progress Notes (Signed)
   THERAPIST PROGRESS NOTE  Session Time: 918- 1003 am  Participation Level: Active  Behavioral Response: NeatAlertEuthymic  Type of Therapy: Individual Therapy  Treatment Goals addressed: Communication: in relationships, and Coping  Interventions: Solution Focused, Strength-based, Psychosocial Skills: grief over separation/divorce, communication,  and Supportive  Summary: Nancy Parks is a 61 y.o. female who presents as pleasant and easily engaged.  The patient is still blissful and relaxed following a weekend getaway with her friend whom she affectionately refers to as her daughter.  The patient reports the peacefulness and serenity she felt was very helpful to her and she hasn't had that feeling in a long time.  Upon return from the trip she texted her estranged spouse [name] and thanked him for setting her free from their marriage.  She did not expect a reply though he did and stated that he only wanted her to be happy.  She is not in a place where she want a relationship with [name] and doesn't see at this time that they are friends.  She reports that at some point in the future that she might be able to be friends with him.   She admits that she really didn't know what she was going to talk about in session today and that she has been doing really, really good.  She wants to [name] on forgiving herself for not noticing that things were not going well and for not noticing.  I suggested that this was an opportunity for her to learn to be present in every relationship so that she is 'eyes wide open' to be able to deal with any potential issues.  She was tearful as she remarked that she thought she had moved past 'this'; I suggested that she very much is in a different place.  I asked the patient about her writing and she admits to reading some entries from January 2013 and that she is clearly moving through her sadness but that she is surprised at times because she feels unnerved even in hearing his  name.  She contemplated in session whether or not to let him know that their cat [name] was being put to sleep today.  I suggested she decide if she wants to make contact and to evaluate the purpose of the contact given that she had stated that she didn't want any contact.  She is going to ponder what she would like to do.  Suicidal/Homicidal: No  Plan: Return as needed.  I talked with patient about how to secure an appointment should the need arise.  Diagnosis: Axis I: Major depressive disorder    Axis II: No diagnosis    [name], [facility] 11/20/2011

## 2011-11-21 ENCOUNTER — Ambulatory Visit (HOSPITAL_COMMUNITY): Payer: Self-pay | Admitting: Psychology

## 2011-11-23 ENCOUNTER — Emergency Department (HOSPITAL_BASED_OUTPATIENT_CLINIC_OR_DEPARTMENT_OTHER)
Admission: EM | Admit: 2011-11-23 | Discharge: 2011-11-23 | Disposition: A | Discharge status: Home / Self Care | Payer: Managed Care, Other (non HMO) | Attending: Emergency Medicine | Admitting: Emergency Medicine

## 2011-11-23 ENCOUNTER — Encounter (HOSPITAL_BASED_OUTPATIENT_CLINIC_OR_DEPARTMENT_OTHER): Payer: Self-pay | Admitting: *Deleted

## 2011-11-23 DIAGNOSIS — R519 Headache, unspecified: Secondary | ICD-10-CM

## 2011-11-23 DIAGNOSIS — R51 Headache: Secondary | ICD-10-CM | POA: Insufficient documentation

## 2011-11-23 MED ORDER — TRAMADOL HCL 50 MG PO TABS: 50.0000 mg | ORAL_TABLET | Freq: Four times a day (QID) | ORAL | Status: DC | PRN

## 2011-11-23 NOTE — ED Notes (Signed)
Pt presents to ED today with headache on let side of head that radiates down jawline.  Pt reports pain started this am.  Pt took no oTC meds pta

## 2011-11-23 NOTE — ED Provider Notes (Signed)
History     CSN: [id number]  Arrival date & time 11/23/11  4098   First MD Initiated Contact with Patient 11/23/11 [phone]      Chief Complaint  Patient presents with  . Headache    (Consider location/radiation/quality/duration/timing/severity/associated sxs/prior treatment) HPI Pt states she woke up at unknown time last night with sharp, electric pain to R side of face. Sharp pain has resolved and now she has mild "burnt" feeling to left face. She has chronic migraines with irregular symptoms. She did not take any of her medications this AM. No trauma. No focal weakness or sensory changes. No visual changes.  History reviewed. No pertinent past medical history.  Past Surgical History  Procedure Date  . Laparoscopic endometriosis fulguration 1979  . Cataract extraction 09/2007    Family History  Problem Relation Age of Onset  . Breast cancer      grandmother   . Cancer Father     colon adenoma  . Hyperlipidemia Sister   . Hypertension Sister   . Cancer Sister     throat    History  Substance Use Topics  . Smoking status: Never Smoker   . Smokeless tobacco: Never Used  . Alcohol Use: No    OB History    Grav Para Term Preterm Abortions TAB SAB Ect Mult Living                  Review of Systems  Constitutional: Negative for fever and chills.  HENT: Negative for facial swelling, neck pain and neck stiffness.   Eyes: Positive for photophobia. Negative for visual disturbance.  Gastrointestinal: Negative for nausea and vomiting.  Musculoskeletal: Negative for back pain.  Skin: Negative for rash and wound.  Neurological: Negative for dizziness, weakness, numbness and headaches.    Allergies  Latex  Home Medications   Current Outpatient Rx  Name Route Sig Dispense Refill  . AMITRIPTYLINE HCL 50 MG PO TABS Oral Take 1 tablet (50 mg total) by mouth at bedtime. 30 tablet 6  . B-COMPLEX PO Oral Take by mouth.      Marland Kitchen VITAMIN D3 1000 UNITS PO CAPS Oral Take by  mouth.      Marland Kitchen CITALOPRAM HYDROBROMIDE 20 MG PO TABS Oral Take 1 tablet (20 mg total) by mouth daily. 90 tablet 1  . SUMATRIPTAN-NAPROXEN SODIUM 85-500 MG PO TABS Oral Take 1 tablet by mouth every 2 (two) hours as needed.      Marland Kitchen TRAMADOL HCL 50 MG PO TABS Oral Take 1 tablet (50 mg total) by mouth every 6 (six) hours as needed for pain. 15 tablet 0    BP 150/98  Pulse 96  Temp 98.3 F (36.8 C) (Oral)  Resp 18  SpO2 99%  Physical Exam  Nursing note and vitals reviewed. Constitutional: She is oriented to person, place, and time. She appears well-developed and well-nourished. No distress.  HENT:  Head: Normocephalic and atraumatic.  Mouth/Throat: Oropharynx is clear and moist.       No TTP of R face, R temporal artery. No deformity, swelling  Eyes: EOM are normal. Pupils are equal, round, and reactive to light.  Neck: Normal range of motion. Neck supple.  Cardiovascular: Normal rate and regular rhythm.   Pulmonary/Chest: Effort normal and breath sounds normal. No respiratory distress. She has no wheezes. She has no rales.  Abdominal: Soft. Bowel sounds are normal. There is no tenderness. There is no rebound and no guarding.  Musculoskeletal: Normal range of motion. She  exhibits no edema and no tenderness.  Neurological: She is alert and oriented to person, place, and time.       5/5 motor in all ext, sensation fully intact. CN II-XII intact.   Skin: Skin is warm and dry. No rash noted. No erythema.    ED Course  Procedures (including critical care time)  Labs Reviewed - No data to display No results found.   1. Facial pain       MDM  Pt exhibits peculiar behavior. Suspect psychiatric origin vs atypical migraine vs neuropathic pain. Will treat with pain meds and encourage to f/u with PMD as needed.         Loren Racer, MD 11/23/11 [phone]

## 2011-11-23 NOTE — ED Notes (Signed)
The patient is undressed from the waist up and in a gown. The bed rails are up and the bed is locked and in the lowest position. The call light is within reach. The patient is resting at this time.

## 2011-11-24 ENCOUNTER — Ambulatory Visit (INDEPENDENT_AMBULATORY_CARE_PROVIDER_SITE_OTHER): Payer: Managed Care, Other (non HMO) | Admitting: Family Medicine

## 2011-11-24 ENCOUNTER — Encounter: Payer: Self-pay | Admitting: Family Medicine

## 2011-11-24 VITALS — BP 129/71 | HR 87 | Temp 98.3°F | Wt 173.0 lb

## 2011-11-24 DIAGNOSIS — R519 Headache, unspecified: Secondary | ICD-10-CM

## 2011-11-24 DIAGNOSIS — R51 Headache: Secondary | ICD-10-CM

## 2011-11-24 NOTE — Progress Notes (Signed)
Subjective:    Patient ID: Nancy Parks, female    DOB: [birth year], 61 y.o.   MRN: [id number]  HPI Has had right sharp facial pain and burning sensation on the right side of her face. Went to ED on Sat and they felt make be related to sleeping on her neck wrong.  Then saw her chiropractor yesterday who worked on her notes.  Says her right ear still doesn't feel right. No hearing loss.  Has had a jerking sensation with the pain as well.  She did call her neurologist.  Burgess Estelle she is feeling better.  Finally did sleep last night.  Some pain on the inside of her throat on the right side.  No jaw pointing or clicking.  Last bad migraine was 2 weeks.  Hx of migraines.   chiropracttor is D.r Hinton [facility].    Review of Systems  BP 129/71  Pulse 87  Temp 98.3 F (36.8 C)  Wt 173 lb (78.472 kg)  SpO2 100%    Allergies  Allergen Reactions  . Latex     REACTION: Rash    No past medical history on file.  Past Surgical History  Procedure Date  . Laparoscopic endometriosis fulguration 1979  . Cataract extraction 09/2007    History   Social History  . Marital Status: Married    Spouse Name: N/A    Number of Children: N/A  . Years of Education: N/A   Occupational History  . accountant   . Life coach    Social History Main Topics  . Smoking status: Never Smoker   . Smokeless tobacco: Never Used  . Alcohol Use: No  . Drug Use: No  . Sexually Active: Yes -- Female partner(s)   Other Topics Concern  . Not on file   Social History Narrative  . No narrative on file    Family History  Problem Relation Age of Onset  . Breast cancer      grandmother   . Cancer Father     colon adenoma  . Hyperlipidemia Sister   . Hypertension Sister   . Cancer Sister     throat    Outpatient Encounter Prescriptions as of 11/24/2011  Medication Sig Dispense Refill  . amitriptyline (ELAVIL) 50 MG tablet Take 1 tablet (50 mg total) by mouth at bedtime.  30  tablet  6  . B Complex-Biotin-FA (B-COMPLEX PO) Take by mouth.        . Cholecalciferol (VITAMIN D3) 1000 UNITS CAPS Take by mouth.        . citalopram (CELEXA) 20 MG tablet Take 1 tablet (20 mg total) by mouth daily.  90 tablet  1  . SUMAtriptan-naproxen (TREXIMET) 85-500 MG per tablet Take 1 tablet by mouth every 2 (two) hours as needed.        . traMADol (ULTRAM) 50 MG tablet Take 1 tablet (50 mg total) by mouth every 6 (six) hours as needed for pain.  15 tablet  0          Objective:   Physical Exam  Constitutional: She is oriented to person, place, and time. She appears well-developed and well-nourished.  HENT:  Head: Normocephalic and atraumatic.  Right Ear: External ear normal.  Left Ear: External ear normal.  Nose: Nose normal.  Mouth/Throat: Oropharynx is clear and moist.       TMs and canals are clear.   Eyes: Conjunctivae normal and EOM are normal. Pupils are equal, round, and reactive  to light.  Neck: Neck supple. No thyromegaly present.  Cardiovascular: Normal rate, regular rhythm and normal heart sounds.   Pulmonary/Chest: Effort normal and breath sounds normal. She has no wheezes.  Lymphadenopathy:    She has no cervical adenopathy.  Neurological: She is alert and oriented to person, place, and time. No cranial nerve deficit.  Skin: Skin is warm and dry.  Psychiatric: She has a normal mood and affect. Her behavior is normal.          Assessment & Plan:  Right facial pain - consider trigeminal neuralgia. Also consider this could be a migraine very. This is a very possible. Offered a five-day steroid course but she declined at this point time. She does have a followup appointment with neurology tomorrow to discuss this further. I see no symptoms of TMJ. Also no rash or lesions to suggest a herpetic infection of the ear or throat. She is also a little young to have temporal arteritis but I will check a sedimentation rate. We will also rule out electrolyte problems  and she's also having jerking episodes, while conscious. Also check a CK and TSH. Continue amitriptyline for now. We have not adjusted her regimen she's been on this for several months. We will call her with the blood work.  Check CBC to rule out infection.

## 2011-11-25 LAB — COMPLETE METABOLIC PANEL WITH GFR
ALT: 19 U/L (ref 0–35)
AST: 20 U/L (ref 0–37)
Albumin: 4.5 g/dL (ref 3.5–5.2)
Alkaline Phosphatase: 104 U/L (ref 39–117)
BUN: 14 mg/dL (ref 6–23)
Calcium: 9.5 mg/dL (ref 8.4–10.5)
Chloride: 104 mEq/L (ref 96–112)
Creat: 0.79 mg/dL (ref 0.50–1.10)
Potassium: 4.2 mEq/L (ref 3.5–5.3)

## 2011-11-25 LAB — CBC WITH DIFFERENTIAL/PLATELET
Basophils Absolute: 0 10*3/uL (ref 0.0–0.1)
Basophils Relative: 0 % (ref 0–1)
Lymphocytes Relative: 68 % — ABNORMAL HIGH (ref 12–46)
MCHC: 34.9 g/dL (ref 30.0–36.0)
Monocytes Absolute: 0.1 10*3/uL (ref 0.1–1.0)
Neutro Abs: 0.6 10*3/uL — ABNORMAL LOW (ref 1.7–7.7)
Neutrophils Relative %: 28 % — ABNORMAL LOW (ref 43–77)
Platelets: 267 10*3/uL (ref 150–400)
RDW: 14.7 % (ref 11.5–15.5)
WBC: 2.3 10*3/uL — ABNORMAL LOW (ref 4.0–10.5)

## 2011-11-25 LAB — SEDIMENTATION RATE: Sed Rate: 4 mm/hr (ref 0–22)

## 2011-11-25 LAB — PATHOLOGIST SMEAR REVIEW

## 2011-11-25 LAB — TSH: TSH: 1.446 u[IU]/mL (ref 0.350–4.500)

## 2011-12-05 ENCOUNTER — Other Ambulatory Visit: Payer: Self-pay | Admitting: Family Medicine

## 2011-12-05 ENCOUNTER — Telehealth: Payer: Self-pay | Admitting: *Deleted

## 2011-12-05 DIAGNOSIS — R899 Unspecified abnormal finding in specimens from other organs, systems and tissues: Secondary | ICD-10-CM

## 2011-12-06 LAB — CBC WITH DIFFERENTIAL/PLATELET
Basophils Absolute: 0 10*3/uL (ref 0.0–0.1)
Lymphocytes Relative: 76 % — ABNORMAL HIGH (ref 12–46)
Lymphs Abs: 1.9 10*3/uL (ref 0.7–4.0)
Neutro Abs: 0.5 10*3/uL — ABNORMAL LOW (ref 1.7–7.7)
Platelets: 286 10*3/uL (ref 150–400)
RBC: 3.66 MIL/uL — ABNORMAL LOW (ref 3.87–5.11)
RDW: 14.9 % (ref 11.5–15.5)
WBC: 2.5 10*3/uL — ABNORMAL LOW (ref 4.0–10.5)

## 2011-12-08 ENCOUNTER — Telehealth: Payer: Self-pay

## 2011-12-08 NOTE — Telephone Encounter (Signed)
[name] called back. I advised her to follow up in one week for labs. She states she still has some of the pain off and on but it is better.  Notes copied from results. See below   Notes Recorded by Ellsworth Lennox, CMA on 12/08/2011 at 2:33 PM [facility] informing Pt ------  Notes Recorded by Agapito Games, MD on 12/08/2011 at 2:14 PM Call pt: White count is still low but a little better. Would like to recheck in one week. Is she feeling better off the Chantix?

## 2011-12-17 ENCOUNTER — Telehealth: Payer: Self-pay | Admitting: *Deleted

## 2011-12-17 DIAGNOSIS — R899 Unspecified abnormal finding in specimens from other organs, systems and tissues: Secondary | ICD-10-CM

## 2011-12-18 ENCOUNTER — Other Ambulatory Visit: Payer: Self-pay | Admitting: Family Medicine

## 2011-12-18 DIAGNOSIS — D72819 Decreased white blood cell count, unspecified: Secondary | ICD-10-CM

## 2011-12-18 LAB — CBC WITH DIFFERENTIAL/PLATELET
Basophils Relative: 0 % (ref 0–1)
HCT: 32.8 % — ABNORMAL LOW (ref 36.0–46.0)
Hemoglobin: 11.6 g/dL — ABNORMAL LOW (ref 12.0–15.0)
Lymphocytes Relative: 74 % — ABNORMAL HIGH (ref 12–46)
Lymphs Abs: 1.7 10*3/uL (ref 0.7–4.0)
Monocytes Absolute: 0.1 10*3/uL (ref 0.1–1.0)
Monocytes Relative: 3 % (ref 3–12)
Neutro Abs: 0.5 10*3/uL — ABNORMAL LOW (ref 1.7–7.7)
Neutrophils Relative %: 23 % — ABNORMAL LOW (ref 43–77)
RBC: 3.48 MIL/uL — ABNORMAL LOW (ref 3.87–5.11)
WBC: 2.3 10*3/uL — ABNORMAL LOW (ref 4.0–10.5)

## 2011-12-19 ENCOUNTER — Telehealth: Payer: Self-pay | Admitting: *Deleted

## 2011-12-19 NOTE — Telephone Encounter (Signed)
She has been scheduled at the [place] location which might explain the wait.  Have Jenn check to see if she can get her in in [place] with hemetology and can always f/u at [facility] Later.

## 2011-12-19 NOTE — Telephone Encounter (Signed)
Pt will not have insurance when divorce is finalized in the next cpl of weeks. Pt is scheduled for 11/29 at [facility] but will not have insurance. Pt would like to know if you will refer her somewhere else in hopes she can get in before insurance is cancelled. Please advise.

## 2011-12-22 NOTE — Telephone Encounter (Signed)
Nancy Parks can you check on this please

## 2011-12-31 NOTE — Telephone Encounter (Signed)
See other note

## 2012-01-01 NOTE — Telephone Encounter (Signed)
Error see other message

## 2012-01-02 ENCOUNTER — Other Ambulatory Visit (HOSPITAL_BASED_OUTPATIENT_CLINIC_OR_DEPARTMENT_OTHER): Payer: Managed Care, Other (non HMO) | Admitting: Lab

## 2012-01-02 ENCOUNTER — Ambulatory Visit (HOSPITAL_BASED_OUTPATIENT_CLINIC_OR_DEPARTMENT_OTHER): Payer: Managed Care, Other (non HMO) | Admitting: Hematology & Oncology

## 2012-01-02 ENCOUNTER — Ambulatory Visit: Payer: Managed Care, Other (non HMO)

## 2012-01-02 VITALS — BP 133/71 | HR 90 | Temp 97.9°F | Resp 16 | Ht 70.0 in | Wt 179.0 lb

## 2012-01-02 DIAGNOSIS — D72819 Decreased white blood cell count, unspecified: Secondary | ICD-10-CM

## 2012-01-02 DIAGNOSIS — D531 Other megaloblastic anemias, not elsewhere classified: Secondary | ICD-10-CM

## 2012-01-02 LAB — CBC WITH DIFFERENTIAL (CANCER CENTER ONLY)
BASO#: 0 10*3/uL (ref 0.0–0.2)
BASO%: 0 % (ref 0.0–2.0)
EOS%: 0.5 % (ref 0.0–7.0)
Eosinophils Absolute: 0 10*3/uL (ref 0.0–0.5)
HCT: 32 % — ABNORMAL LOW (ref 34.8–46.6)
HGB: 11 g/dL — ABNORMAL LOW (ref 11.6–15.9)
LYMPH#: 1.2 10*3/uL (ref 0.9–3.3)
LYMPH%: 54.7 % — ABNORMAL HIGH (ref 14.0–48.0)
MCH: 33.8 pg (ref 26.0–34.0)
MCHC: 34.4 g/dL (ref 32.0–36.0)
MCV: 99 fL (ref 81–101)
MONO#: 0.1 10*3/uL (ref 0.1–0.9)
MONO%: 4.2 % (ref 0.0–13.0)
NEUT#: 0.9 10*3/uL — ABNORMAL LOW (ref 1.5–6.5)
NEUT%: 40.6 % (ref 39.6–80.0)
Platelets: 247 10*3/uL (ref 145–400)
RBC: 3.25 10*6/uL — ABNORMAL LOW (ref 3.70–5.32)
RDW: 13.5 % (ref 11.1–15.7)
WBC: 2.1 10*3/uL — ABNORMAL LOW (ref 3.9–10.0)

## 2012-01-02 LAB — CHCC SATELLITE - SMEAR

## 2012-01-02 NOTE — Progress Notes (Signed)
CC:   Nancy Parks, M.D.  DIAGNOSES: 1. Leukopenia. 2. Normochromic normocytic anemia.  HISTORY OF PRESENT ILLNESS:  Nancy Parks is a very nice 61 year old white female.  She is followed by Dr. Nani Parks.  She has been quite healthy.  She has been on some antidepressants.  She is going through a lot in her personal life right now.  Dr. Linford Parks has noted that her white cell count has been going down. Back in I think 2011 a CBC was done which showed a white cell count of 4.9, hemoglobin 13.6, hematocrit 42.7, platelet count 322.  MCV was 90.3.  Back in I think September of this year, a CBC was done which showed a white cell count of 2.3, hemoglobin 11.4, hematocrit 32.7, platelet count 267.  Her white cell differential showed 28 segs, 68 lymphs.  She apparently was bitten by one of her cats and developed a bad infection in her hand.  She was on antibiotics for this.  Dr. Linford Parks has been following her blood count.  Back in late October, her CBC was done which showed a white cell count 2.3, hemoglobin 11.6, hematocrit 32.8 and platelet count 277.  Her white cell differential showed 23 segs, 74 lymphs.  Because of the persistent leukopenia it was felt that a hematologic evaluation was indicated.  Again, she has been on antidepressants for about a year.  Otherwise she has really not had much change in her medications.  She has had some wounds on her hands and legs which have taken a while to improve.  She has had no cough.  There have been no rashes.  There has been no weight loss or weight gain.  She has no appetite, she is a vegetarian. She has had no change in bowel or bladder habits.  She has not had a mammogram for 3 years.  Her last colonoscopy was within 10 years.  She has some facial pain on the right.  She may have trigeminal neuralgia.  This is being followed I think by Neurology.  PAST MEDICAL HISTORY:  Unremarkable outside of the  situational depression.  ALLERGIES:  Latex.  MEDICATIONS: 1. Elavil 50 mg p.o. q.h.s. 2. Celexa 20 mg p.o. daily. 3. Folic acid/B12 one p.o. daily. 4. Sumatriptan/Naprosyn (85/500) one p.o. daily p.r.n. 5. Vitamin D 1000 units p.o. daily.  SOCIAL HISTORY:  Negative for tobacco use.  There is no alcohol use. She has no occupational exposures.  She works as an Airline pilot.  FAMILY HISTORY:  Relatively noncontributory.  REVIEW OF SYSTEMS:  As stated in history of present illness.  No additional findings noted on a 12 system review.  PHYSICAL EXAMINATION:  General:  This is a well-developed, well- nourished white female in no obvious distress.  Vital signs:  Show temperature of 97.9, pulse 90, respiratory rate 16, blood pressure 133/71.  Weight is 179.  Head and neck:  Shows a normocephalic, atraumatic skull.  There are no ocular or oral lesions.  There are no palpable cervical or supraclavicular lymph nodes.  Lungs:  Clear to percussion and auscultation bilaterally.  Cardiac:  Regular rate and rhythm with a normal S1, S2.  There are no murmurs, rubs or bruits. Abdomen:  Soft with good bowel sounds.  There is no fluid wave.  There is no palpable abdominal mass.  There is no guarding or rebound tenderness.  There is no palpable hepatosplenomegaly.  Back:  No tenderness over the spine, ribs or hips.  No kyphosis or osteoporotic changes are  noted.  Extremities:  Show no clubbing, cyanosis or edema. She has good range motion of her joints.  Skin:  Shows no rashes, ecchymoses or petechiae.  Neurological:  Shows no focal neurological deficits.  LABORATORY STUDIES:  White cell count is 2.1, hemoglobin 11, hematocrit 32, platelet count 247.  MCV is 99.  White cell differential shows 41 segs, 55 lymphs, 4 monos.  Peripheral smear shows a normochromic, normocytic population of red blood cells.  There are no nucleated red blood cells.  There is no rouleaux formation.  There are no target  cells.  I see no schistocytes or spherocytes.  White cells appear decreased in number.  I do not see any hypersegmented polys.  There is a slight increase in lymphocytes which appear mature.  There are no hypersegmented polys.  There are no any immature myeloid cells.  There are no blasts.  Platelets are adequate in number and size.  IMPRESSION:  Nancy Parks is a very nice 61 year old white female with leukopenia.  This has been noted within the past few months.  Again, her CBC 2 years ago was normal.  I do not see anything on her blood smear that is suggestive of an underlying issue.  The fact that she is a vegetarian might make B12 deficiency a possibility.  Again, I did not see anything that looks suspicious of this with no hypersegmented polys on her blood smear.  I am encouraged that her neutrophil and lymphocyte ratio is normalizing. As such, I really think that we can just watch her for right now.  I do not want to get too anxious and start doing unnecessary tests.  I do not see anything that would suggest a collagen vascular issue.  I do not see anything that would suggest an underlying bone marrow disorder.  I think that myelodysplasia would be unlikely.  I had a very time with Nancy Parks.  She is very eloquent.  I spent a good hour or so with her today.  I want see her back after January 1st.  I think that lab test will be very important for [name] and will give [name] an idea as to whether or not we are going to have to do a bone marrow test.    ______________________________ Nancy Parks, M.D. PRE/MEDQ  D:  01/02/2012  T:  01/02/2012  Job:  [id number]

## 2012-01-02 NOTE — Progress Notes (Signed)
This office note has been dictated.

## 2012-01-05 ENCOUNTER — Telehealth: Payer: Self-pay | Admitting: Hematology & Oncology

## 2012-01-05 NOTE — Telephone Encounter (Signed)
Apt calendar mailed out to patients home

## 2012-01-09 ENCOUNTER — Encounter: Payer: Self-pay | Admitting: Family Medicine

## 2012-01-09 ENCOUNTER — Ambulatory Visit (INDEPENDENT_AMBULATORY_CARE_PROVIDER_SITE_OTHER): Payer: Managed Care, Other (non HMO) | Admitting: Family Medicine

## 2012-01-09 VITALS — BP 147/90 | HR 87 | Wt 177.0 lb

## 2012-01-09 DIAGNOSIS — L0390 Cellulitis, unspecified: Secondary | ICD-10-CM

## 2012-01-09 DIAGNOSIS — L0291 Cutaneous abscess, unspecified: Secondary | ICD-10-CM

## 2012-01-09 DIAGNOSIS — M7710 Lateral epicondylitis, unspecified elbow: Secondary | ICD-10-CM

## 2012-01-09 DIAGNOSIS — M7711 Lateral epicondylitis, right elbow: Secondary | ICD-10-CM

## 2012-01-09 MED ORDER — SULFAMETHOXAZOLE-TRIMETHOPRIM 800-160 MG PO TABS: ORAL_TABLET | ORAL | Status: AC

## 2012-01-09 NOTE — Progress Notes (Signed)
CC: Nancy Parks is a 60 y.o. female is here for swollen finger   Subjective: HPI:  Left middle finger swelling and redness worsening over the past 2-3 days.  Started from an abrasion weeks ago.  Appeared to be well healing up until 2-3 days ago.  Pain 1/10 at the proximal phalange not ifluenced by anything in particular.  Swelling of this phlange without joint involvement proximally nor distally.  Interventions have included triple an appointment without improvement. She denies fevers, chills, nausea, rapid heartbeat, nor decrease in range of motion of the hand  Complains of right elbow pain. The present for weeks. Comes and goes but seems to flareup when working in yard. No interventions as of yet. Pain is mild in severity and nonradiating. She denies trauma to the elbow. She denies redness or swelling of the elbow. She denies motor or sensory disturbances in the right upper extremity   Review Of Systems Outlined In HPI  No past medical history on file.   Family History  Problem Relation Age of Onset  . Breast cancer      grandmother   . Cancer Father     colon adenoma  . Hyperlipidemia Sister   . Hypertension Sister   . Cancer Sister     throat     History  Substance Use Topics  . Smoking status: Never Smoker   . Smokeless tobacco: Never Used  . Alcohol Use: No     Objective: Filed Vitals:   01/09/12 1127  BP: 147/90  Pulse: 87    General: Alert and Oriented, No Acute Distress Lungs: Comfortable work of breathing Cardiac: Regular rate and rhythm.  Extremities: No peripheral edema.  Strong peripheral pulses. Left middle finger has a 3 mm circular scab with approximately 1 cm of surrounding erythema and without discharge at the scab, mild swelling of the proximal phalange. Right elbow pain is reproduced with palpation of the lateral epicondyle, and resisted wrist extension. Mental Status: No depression, anxiety, nor agitation. Skin: Warm and dry.  Assessment &  Plan: Nancy Parks was seen today for swollen finger.  Diagnoses and associated orders for this visit:  Cellulitis - sulfamethoxazole-trimethoprim (SEPTRA DS) 800-160 MG per tablet; One by mouth twice a day for ten days.  Right lateral epicondylitis    Start Bactrim for cellulitis, keep scab covered with clean bandage. Permit or erythema and has been marked, patient instructed to present to urgent care on Sunday if redness is encroaching beyond this provider.Signs and symptoms requring emergent/urgent reevaluation were discussed with the patient.  Patient was given handout on stretching exercises and rehabilitation exercises for tennis elbow. She declines bracing/banding today. Encouraged to follow up with Nancy Parks. is a consult if not improving in the next 2 weeks.  Return if symptoms worsen or fail to improve.

## 2012-01-16 ENCOUNTER — Ambulatory Visit: Payer: Self-pay

## 2012-01-16 ENCOUNTER — Other Ambulatory Visit: Payer: Self-pay | Admitting: Lab

## 2012-01-16 ENCOUNTER — Ambulatory Visit: Payer: Self-pay | Admitting: Hematology & Oncology

## 2012-02-19 ENCOUNTER — Encounter (HOSPITAL_COMMUNITY): Payer: Self-pay | Admitting: Psychology

## 2012-02-19 DIAGNOSIS — F331 Major depressive disorder, recurrent, moderate: Secondary | ICD-10-CM

## 2012-02-19 NOTE — Progress Notes (Signed)
Patient ID: Nancy Parks, female   DOB: [birth year], 62 y.o.   MRN: [id number]  Outpatient Therapist Discharge Summary   Discharge Date:  02/20/2012 Reason for Discharge:  Completed treatment; no contact in 90 days Diagnosis:  Axis I:   1. Major depressive disorder, recurrent episode, moderate     Axis II:  None  Axis III:  Osteoporosis, migraine,   Comments:  Resume therapy as requested.  [name]

## 2012-02-20 ENCOUNTER — Ambulatory Visit (INDEPENDENT_AMBULATORY_CARE_PROVIDER_SITE_OTHER): Payer: Managed Care, Other (non HMO) | Admitting: Family Medicine

## 2012-02-20 ENCOUNTER — Encounter: Payer: Self-pay | Admitting: Family Medicine

## 2012-02-20 VITALS — BP 138/78 | HR 104 | Resp 18 | Wt 177.0 lb

## 2012-02-20 DIAGNOSIS — F32A Depression, unspecified: Secondary | ICD-10-CM

## 2012-02-20 DIAGNOSIS — F329 Major depressive disorder, single episode, unspecified: Secondary | ICD-10-CM

## 2012-02-20 DIAGNOSIS — G43909 Migraine, unspecified, not intractable, without status migrainosus: Secondary | ICD-10-CM

## 2012-02-20 DIAGNOSIS — F3289 Other specified depressive episodes: Secondary | ICD-10-CM

## 2012-02-20 MED ORDER — CITALOPRAM HYDROBROMIDE 10 MG PO TABS: 10.0000 mg | ORAL_TABLET | Freq: Every day | ORAL | Status: DC

## 2012-02-20 MED ORDER — AMITRIPTYLINE HCL 50 MG PO TABS: 50.0000 mg | ORAL_TABLET | Freq: Every day | ORAL | Status: DC

## 2012-02-20 NOTE — Progress Notes (Signed)
  Subjective:    Patient ID: Nancy Parks, female    DOB: [birth year], 62 y.o.   MRN: [id number]  HPI F/U up on migraine - went 6 weeks without a migraine. Says the Treximet helps.  Does have a hangover effect afterwards.   Depression - she is doing well and wants to decrease her dose. Did have some sad days around Christmas.  She is sleeping well. Still seeing [employer].     Review of Systems     Objective:   Physical Exam  Constitutional: She is oriented to person, place, and time. She appears well-developed and well-nourished.  HENT:  Head: Normocephalic and atraumatic.  Cardiovascular: Normal rate, regular rhythm and normal heart sounds.   Pulmonary/Chest: Effort normal and breath sounds normal.  Neurological: She is alert and oriented to person, place, and time.  Skin: Skin is warm and dry.  Psychiatric: She has a normal mood and affect. Her behavior is normal.          Assessment & Plan:  Migraines - Well controlled.  Continue prn resuce meds.  Continue current dose of amitryptiline at bedtime. RF sent to pharm.    Depression - Well controlled. Will dec her dose to 10mg  on the citalopram.  F/U in 4 mo. Call sooner if any problems.   Has f/u with heme in about 2 weeks.  Still has poor wound healing.

## 2012-03-03 ENCOUNTER — Ambulatory Visit (HOSPITAL_BASED_OUTPATIENT_CLINIC_OR_DEPARTMENT_OTHER): Payer: BC Managed Care – PPO | Admitting: Medical

## 2012-03-03 ENCOUNTER — Other Ambulatory Visit (HOSPITAL_BASED_OUTPATIENT_CLINIC_OR_DEPARTMENT_OTHER): Payer: BC Managed Care – PPO | Admitting: Lab

## 2012-03-03 VITALS — BP 109/67 | HR 92 | Temp 98.0°F | Resp 16 | Ht 70.0 in | Wt 177.0 lb

## 2012-03-03 DIAGNOSIS — D72819 Decreased white blood cell count, unspecified: Secondary | ICD-10-CM

## 2012-03-03 DIAGNOSIS — D531 Other megaloblastic anemias, not elsewhere classified: Secondary | ICD-10-CM

## 2012-03-03 LAB — VITAMIN B12: Vitamin B-12: 489 pg/mL (ref 211–911)

## 2012-03-03 LAB — CBC WITH DIFFERENTIAL (CANCER CENTER ONLY)
BASO#: 0 10*3/uL (ref 0.0–0.2)
Eosinophils Absolute: 0 10*3/uL (ref 0.0–0.5)
HCT: 34.4 % — ABNORMAL LOW (ref 34.8–46.6)
HGB: 11.6 g/dL (ref 11.6–15.9)
MCH: 33.7 pg (ref 26.0–34.0)
MONO%: 5.2 % (ref 0.0–13.0)
NEUT#: 0.6 10*3/uL — ABNORMAL LOW (ref 1.5–6.5)
RBC: 3.44 10*6/uL — ABNORMAL LOW (ref 3.70–5.32)

## 2012-03-03 LAB — RETICULOCYTES (CHCC)
ABS Retic: 35 10*3/uL (ref 19.0–186.0)
Retic Ct Pct: 1 % (ref 0.4–2.3)

## 2012-03-03 NOTE — Progress Notes (Signed)
Diagnoses: #1.  Leukopenia. #2.  Normochromic, normocytic anemia.  Current therapy: Observation.  Interim history: Nancy Parks presents today for an office followup visit.  We recently saw her as a new patient for leukopenia and normochromic, normocytic anemia.  We did not see anything on her blood smear that was suggestive of an underlying issue.  There was no obvious underlying bone marrow disorder.  It was felt that we could monitor her blood work, so that we could get an idea as to whether or not we would have to do a bone marrow test.  We'll we saw her in November.  Her white count was 2.1, hemoglobin 11.0, MCV 99.  Neutrophils were 0.9, and lymphocytes were 54.7.  Today.  Her white count is 2.1, neutrophils are 0.6.  Her hemoglobin has come up from 11.0 to 11.6.  She's not symptomatic.  She's not reported any recent upper respiratory, infections.  She's not reported any type of pulmonary infections.  She's not reported any new medication.  She does not report any mouth sores any fevers, chills, or night sweats.  She, reports, she has a decent appetite.  She denies any nausea, vomiting, diarrhea, constipation, cough, chest pain, shortness of breath, any lower leg swelling.  She denies any abdominal pain, any visual changes, headaches, or rashes.  She does have some slow wound healing on her hands and legs, which have taken a while to improve.  She does see her primary care physician for this.  She has good energy.  She does not have any excessive fatigue or weakness.  She is able to perform her activities of daily living without any hindrance or decline.  Review of Systems: Constitutional:Negative for malaise/fatigue, fever, chills, weight loss, diaphoresis, activity change, appetite change, and unexpected weight change.  HEENT: Negative for double vision, blurred vision, visual loss, ear pain, tinnitus, congestion, rhinorrhea, epistaxis sore throat or sinus disease, oral pain/lesion, tongue  soreness Respiratory: Negative for cough, chest tightness, shortness of breath, wheezing and stridor.  Cardiovascular: Negative for chest pain, palpitations, leg swelling, orthopnea, PND, DOE or claudication Gastrointestinal: Negative for nausea, vomiting, abdominal pain, diarrhea, constipation, blood in stool, melena, hematochezia, abdominal distention, anal bleeding, rectal pain, anorexia and hematemesis.  Genitourinary: Negative for dysuria, frequency, hematuria,  Musculoskeletal: Negative for myalgias, back pain, joint swelling, arthralgias and gait problem.  Skin: Negative for rash, color change, pallor and wound.  Neurological:. Negative for dizziness/light-headedness, tremors, seizures, syncope, facial asymmetry, speech difficulty, weakness, numbness, headaches and paresthesias.  Hematological: Negative for adenopathy. Does not bruise/bleed easily.  Psychiatric/Behavioral:  Negative for depression, no loss of interest in normal activity or change in sleep pattern.   Physical Exam: This is a 62 year old, well-developed, well-nourished, white female, in no obvious distress Vitals: Temperature 98.0 degrees, pulse 92, respirations 16, blood pressure 109/67, weight 177 pounds HEENT reveals a normocephalic, atraumatic skull, no scleral icterus, no oral lesions  Neck is supple without any cervical or supraclavicular adenopathy.  Lungs are clear to auscultation bilaterally. There are no wheezes, rales or rhonci Cardiac is regular rate and rhythm with a normal S1 and S2. There are no murmurs, rubs, or bruits.  Abdomen is soft with good bowel sounds, there is no palpable mass. There is no palpable hepatosplenomegaly. There is no palpable fluid wave.  Musculoskeletal no tenderness of the spine, ribs, or hips.  Extremities there are no clubbing, cyanosis, or edema.  Skin no petechia, purpura or ecchymosis Neurologic is nonfocal.  Laboratory Data: White count 2.1, hemoglobin  11.6, hematocrit 34.4,  MCV 100, platelets 250,000, neutrophils 0.6 lymphocytes 66.2  Current Outpatient Prescriptions on File Prior to Visit  Medication Sig Dispense Refill  . amitriptyline (ELAVIL) 50 MG tablet Take 1 tablet (50 mg total) by mouth at bedtime.  90 tablet  1  . Cholecalciferol (VITAMIN D3) 1000 UNITS CAPS Take by mouth.        . citalopram (CELEXA) 10 MG tablet Take 1 tablet (10 mg total) by mouth daily.  90 tablet  1  . FOLIC ACID-B6-B12-D PO Take 4,400 Units by mouth every morning.      Marland Kitchen OVER THE COUNTER MEDICATION Take by mouth at bedtime. Magnesium Complex 4 po nightly.      Marland Kitchen OVER THE COUNTER MEDICATION Take by mouth every morning. Spleen Activator      . OVER THE COUNTER MEDICATION Take by mouth 3 (three) times daily. Prolamine Iodine      . SUMAtriptan-naproxen (TREXIMET) 85-500 MG per tablet Take 1 tablet by mouth as needed.        Assessment/Plan: This is a 62 year old, white female, with the following issues:  #1.  Leukopenia.  This really has not changed much since we saw her last.  Her hemoglobin actually looks better.  I still think at this time, we need to continue with observation.  I do not think she needs a bone marrow, biopsy at this time.  She is not symptomatic.  #2.  Followup.  We will follow back up with Nancy Parks in 3 months, but before then should there be questions or concerns.

## 2012-05-03 ENCOUNTER — Other Ambulatory Visit: Payer: Self-pay | Admitting: Family Medicine

## 2012-06-04 ENCOUNTER — Ambulatory Visit (HOSPITAL_BASED_OUTPATIENT_CLINIC_OR_DEPARTMENT_OTHER): Payer: BC Managed Care – PPO | Admitting: Hematology & Oncology

## 2012-06-04 ENCOUNTER — Ambulatory Visit (HOSPITAL_BASED_OUTPATIENT_CLINIC_OR_DEPARTMENT_OTHER): Payer: BC Managed Care – PPO | Admitting: Lab

## 2012-06-04 VITALS — BP 125/73 | HR 98 | Temp 98.1°F | Resp 16 | Ht 70.0 in | Wt 187.0 lb

## 2012-06-04 DIAGNOSIS — D72819 Decreased white blood cell count, unspecified: Secondary | ICD-10-CM

## 2012-06-04 LAB — CBC WITH DIFFERENTIAL (CANCER CENTER ONLY)
BASO#: 0 10*3/uL (ref 0.0–0.2)
EOS%: 0.5 % (ref 0.0–7.0)
HGB: 11.7 g/dL (ref 11.6–15.9)
MCH: 33.4 pg (ref 26.0–34.0)
MCHC: 33.9 g/dL (ref 32.0–36.0)
MONO%: 3.4 % (ref 0.0–13.0)
NEUT#: 0.6 10*3/uL — ABNORMAL LOW (ref 1.5–6.5)

## 2012-06-04 LAB — RETICULOCYTES (CHCC): Retic Ct Pct: 1.3 % (ref 0.4–2.3)

## 2012-06-04 NOTE — Progress Notes (Signed)
This office note has been dictated.

## 2012-06-05 NOTE — Progress Notes (Signed)
CC:   [name], M.D.  DIAGNOSIS:  Leukopenia.  CURRENT THERAPY:  Observation.  INTERIM HISTORY:  [name] comes in for followup.  She is doing okay. She does feel a little tired.  She has had no problem with infections. She got through the wintertime without many problems.  She is still working.  She travels for her job.  She has had no change in bowel or bladder habits.  There has been no change in her medications.  She has had no leg swelling.  There has been some occasional headache.  She said she has had no cough or shortness of breath.  PHYSICAL EXAMINATION:  General:  This is a well-developed, well- nourished white female in no obvious distress.  Vital signs: Temperature of 98.1, pulse 98, respiratory rate 16, blood pressure 125/73.  Weight is 187.  Head and neck:  Normocephalic, atraumatic skull.  There are no ocular or oral lesions.  There are no palpable cervical or supraclavicular lymph nodes.  Lungs:  Clear bilaterally. Cardiac:  Regular rate and rhythm, with a normal S1 and S2.  There are no murmurs, rubs, or bruits.  Abdomen:  Soft with good bowel sounds. There is no palpable abdominal mass.  There is no fluid wave.  There is no palpable hepatosplenomegaly.  Back:  No tenderness over the spine, ribs, or hips.  Extremities:  Show no clubbing, cyanosis or edema. Neurological:  Shows no focal neurological deficits.  LABORATORY STUDIES:  White cell count is 2, hemoglobin 11.7, hematocrit 34.5, platelet count 260,000.  MCV is 99.  White cell differential shows 27 segs, 68 lymphs, 3 monos.  Peripheral smear shows a normochromic, normocytic population of red blood cells.  There are no nucleated red blood cells.  There is no rouleaux formation.  I see no schistocytes.  White cells are decreased in number.  There may is an increase in lymphocytes.  She has a few atypical lymphocytes.  There are also some atypical monocytes.  I do not see any blasts.  Platelets  are adequate in number and size.  IMPRESSION:  [name] is a very nice 62 year old white female with leukopenia.  I am just worried that her neutrophils continue to drop. Her ANC is 600.  I think that it is time for a bone marrow test.  I just feel more comfortable getting a bone marrow test so we can send this off for flow cytometry and cytogenetics.  I want to make sure that we are not dealing with some underlying hematologic issue.  I spoke to Nancy Parks about this at length.  I explained to her why I thought a bone marrow test was necessary.  She understands and agrees. We will get the bone marrow set up for 06/29/2012 at [facility].  I will plan to see Nancy Parks back about 2 weeks after the bone marrow is done.    ______________________________ Josph Macho, M.D. PRE/MEDQ  D:  06/04/2012  T:  06/05/2012  Job:  [id number]

## 2012-06-22 ENCOUNTER — Encounter (HOSPITAL_COMMUNITY): Payer: Self-pay | Admitting: Pharmacy Technician

## 2012-06-29 ENCOUNTER — Encounter (HOSPITAL_COMMUNITY): Payer: Self-pay

## 2012-06-29 ENCOUNTER — Ambulatory Visit (HOSPITAL_COMMUNITY)
Admission: RE | Admit: 2012-06-29 | Discharge: 2012-06-29 | Disposition: A | Discharge status: Home / Self Care | Payer: BC Managed Care – PPO | Source: Ambulatory Visit | Attending: Hematology & Oncology | Admitting: Hematology & Oncology

## 2012-06-29 ENCOUNTER — Ambulatory Visit (HOSPITAL_BASED_OUTPATIENT_CLINIC_OR_DEPARTMENT_OTHER): Payer: BC Managed Care – PPO | Admitting: Hematology & Oncology

## 2012-06-29 VITALS — BP 121/76 | HR 68 | Temp 97.9°F | Resp 14

## 2012-06-29 DIAGNOSIS — D72819 Decreased white blood cell count, unspecified: Secondary | ICD-10-CM | POA: Insufficient documentation

## 2012-06-29 DIAGNOSIS — C9030 Solitary plasmacytoma not having achieved remission: Secondary | ICD-10-CM | POA: Insufficient documentation

## 2012-06-29 HISTORY — DX: Decreased white blood cell count, unspecified: D72.819

## 2012-06-29 HISTORY — DX: Major depressive disorder, single episode, unspecified: F32.9

## 2012-06-29 HISTORY — DX: Headache: R51

## 2012-06-29 HISTORY — DX: Depression, unspecified: F32.A

## 2012-06-29 LAB — CBC WITH DIFFERENTIAL/PLATELET
Basophils Absolute: 0 10*3/uL (ref 0.0–0.1)
Basophils Relative: 0 % (ref 0–1)
Eosinophils Absolute: 0 10*3/uL (ref 0.0–0.7)
HCT: 33.7 % — ABNORMAL LOW (ref 36.0–46.0)
Hemoglobin: 11.6 g/dL — ABNORMAL LOW (ref 12.0–15.0)
Lymphs Abs: 1.4 10*3/uL (ref 0.7–4.0)
MCH: 33 pg (ref 26.0–34.0)
MCHC: 34.4 g/dL (ref 30.0–36.0)
Monocytes Absolute: 0.1 10*3/uL (ref 0.1–1.0)
Neutro Abs: 0.3 10*3/uL — ABNORMAL LOW (ref 1.7–7.7)
RDW: 13.4 % (ref 11.5–15.5)

## 2012-06-29 MED ORDER — MIDAZOLAM HCL 5 MG/5ML IJ SOLN
INTRAMUSCULAR | Status: AC | PRN
  Administered 2012-06-29: 1 mg via INTRAVENOUS
  Administered 2012-06-29: 2 mg via INTRAVENOUS
  Administered 2012-06-29 (×3): 1 mg via INTRAVENOUS

## 2012-06-29 MED ORDER — SODIUM CHLORIDE 0.9 % IV SOLN
INTRAVENOUS | Status: DC
  Administered 2012-06-29: 07:00:00 via INTRAVENOUS

## 2012-06-29 MED ORDER — MEPERIDINE HCL 25 MG/ML IJ SOLN
INTRAMUSCULAR | Status: AC | PRN
  Administered 2012-06-29 (×2): 25 mg via INTRAVENOUS

## 2012-06-29 MED ORDER — MEPERIDINE HCL 50 MG/ML IJ SOLN
25.0000 mg | Freq: Once | INTRAMUSCULAR | Status: DC
  Filled 2012-06-29: qty 1

## 2012-06-29 MED ORDER — MIDAZOLAM HCL 10 MG/2ML IJ SOLN
10.0000 mg | Freq: Once | INTRAMUSCULAR | Status: DC
  Filled 2012-06-29: qty 2

## 2012-06-29 NOTE — ED Notes (Signed)
Family updated as to patient's status.

## 2012-06-29 NOTE — Progress Notes (Signed)
This office note has been dictated.

## 2012-06-29 NOTE — ED Notes (Signed)
MD at bedside.  Asa 1, mallampati class 1, pre-procedure

## 2012-06-29 NOTE — ED Notes (Signed)
Patient is resting comfortably. 

## 2012-06-29 NOTE — Procedures (Signed)
Nancy Parks was brought to the short stay unit at [facility]. She had an IV placed peripherally without any difficulties.  Her Mallampati score was 1.  ASA class was 1.  We did the appropriate time-out procedure.  She was then placed onto her right side.  She received a total of 50 mg of Demerol and then 6 mg of Versed for IV sedation.  The left posterior iliac crest region was prepped and draped in sterile fashion.  Eight cc of 2% lidocaine was infiltrated under the skin down to the periosteum.  I did have to use a 22 gauge spinal needle to reach the periosteum.  We then used a #11 scalpel to make an incision through the skin.  Two bone marrow aspirates were obtained without difficulty.  I used the extra long Jamshidi biopsy needle.  I obtained an excellent bone marrow biopsy core.  We dressed the procedure site sterilely.  Nancy Parks tolerated the procedure well.  There were no complications.    ______________________________ Josph Macho, M.D. PRE/MEDQ  D:  06/29/2012  T:  06/29/2012  Job:  [id number]

## 2012-06-29 NOTE — ED Notes (Signed)
Procedure completed, pt rolled supine

## 2012-06-29 NOTE — ED Notes (Signed)
Dressing dry, clean, intact.

## 2012-06-29 NOTE — ED Notes (Signed)
Biopsy site clean,dry and intact. 

## 2012-07-02 ENCOUNTER — Other Ambulatory Visit: Payer: Self-pay | Admitting: Hematology & Oncology

## 2012-07-02 ENCOUNTER — Telehealth: Payer: Self-pay | Admitting: Hematology & Oncology

## 2012-07-02 DIAGNOSIS — D472 Monoclonal gammopathy: Secondary | ICD-10-CM

## 2012-07-02 DIAGNOSIS — C9000 Multiple myeloma not having achieved remission: Secondary | ICD-10-CM

## 2012-07-02 NOTE — Telephone Encounter (Signed)
Pt aware to get bone survey either any day before 5-27 or same day before MD appointment

## 2012-07-13 ENCOUNTER — Encounter: Payer: Self-pay | Admitting: Hematology & Oncology

## 2012-07-13 ENCOUNTER — Ambulatory Visit (HOSPITAL_BASED_OUTPATIENT_CLINIC_OR_DEPARTMENT_OTHER)
Admission: RE | Admit: 2012-07-13 | Discharge: 2012-07-13 | Disposition: A | Discharge status: Home / Self Care | Payer: BC Managed Care – PPO | Source: Ambulatory Visit | Attending: Hematology & Oncology | Admitting: Hematology & Oncology

## 2012-07-13 ENCOUNTER — Ambulatory Visit (HOSPITAL_BASED_OUTPATIENT_CLINIC_OR_DEPARTMENT_OTHER): Payer: BC Managed Care – PPO | Admitting: Lab

## 2012-07-13 ENCOUNTER — Ambulatory Visit (HOSPITAL_BASED_OUTPATIENT_CLINIC_OR_DEPARTMENT_OTHER): Payer: BC Managed Care – PPO | Admitting: Hematology & Oncology

## 2012-07-13 VITALS — BP 124/80 | HR 87 | Temp 98.7°F | Resp 16 | Ht 70.0 in | Wt 182.0 lb

## 2012-07-13 DIAGNOSIS — C9000 Multiple myeloma not having achieved remission: Secondary | ICD-10-CM

## 2012-07-13 DIAGNOSIS — D72819 Decreased white blood cell count, unspecified: Secondary | ICD-10-CM

## 2012-07-13 DIAGNOSIS — D472 Monoclonal gammopathy: Secondary | ICD-10-CM

## 2012-07-13 DIAGNOSIS — Z0389 Encounter for observation for other suspected diseases and conditions ruled out: Secondary | ICD-10-CM | POA: Insufficient documentation

## 2012-07-13 DIAGNOSIS — G40009 Localization-related (focal) (partial) idiopathic epilepsy and epileptic syndromes with seizures of localized onset, not intractable, without status epilepticus: Secondary | ICD-10-CM | POA: Insufficient documentation

## 2012-07-13 HISTORY — DX: Multiple myeloma not having achieved remission: C90.00

## 2012-07-13 HISTORY — DX: Decreased white blood cell count, unspecified: D72.819

## 2012-07-13 LAB — CBC WITH DIFFERENTIAL (CANCER CENTER ONLY)
BASO#: 0 10*3/uL (ref 0.0–0.2)
EOS%: 0.6 % (ref 0.0–7.0)
HCT: 33.6 % — ABNORMAL LOW (ref 34.8–46.6)
HGB: 11.5 g/dL — ABNORMAL LOW (ref 11.6–15.9)
LYMPH#: 1.2 10*3/uL (ref 0.9–3.3)
LYMPH%: 69.6 % — ABNORMAL HIGH (ref 14.0–48.0)
MCH: 33.7 pg (ref 26.0–34.0)
MCHC: 34.2 g/dL (ref 32.0–36.0)
MCV: 99 fL (ref 81–101)
MONO%: 5.3 % (ref 0.0–13.0)
NEUT%: 24.5 % — ABNORMAL LOW (ref 39.6–80.0)
RDW: 12.9 % (ref 11.1–15.7)

## 2012-07-13 LAB — CMP (CANCER CENTER ONLY)
ALT(SGPT): 11 U/L (ref 10–47)
Alkaline Phosphatase: 93 U/L — ABNORMAL HIGH (ref 26–84)
CO2: 30 mEq/L (ref 18–33)
Potassium: 3.9 mEq/L (ref 3.3–4.7)
Sodium: 138 mEq/L (ref 128–145)
Total Bilirubin: 0.7 mg/dl (ref 0.20–1.60)
Total Protein: 7.2 g/dL (ref 6.4–8.1)

## 2012-07-13 MED ORDER — DEXAMETHASONE 4 MG PO TABS: 20.0000 mg | ORAL_TABLET | Freq: Once | ORAL | Status: DC

## 2012-07-13 MED ORDER — FAMCICLOVIR 250 MG PO TABS: ORAL_TABLET | ORAL | Status: DC

## 2012-07-13 NOTE — Progress Notes (Signed)
This office note has been dictated.

## 2012-07-13 NOTE — Patient Instructions (Signed)
(1) Please call [facility] tomorrow to do you patient survey. (2) Call [facility] at [phone] if you have any questions about the Revlimid. You will take 25 mg (1) capsule for 21 days then take 7 days off. That consists of a cycle.  (3) Start Decadron 20 mg (4) 5 mg tabs weekly. Take with food.    Complete Blood Count  A complete blood count is a group of tests that measure several characteristics of the three types of cells in your blood. The liquid portion of your blood (plasma) is not used in these tests. Irregularities found in results from these tests can indicate different conditions, such as anemia, infections, bleeding problems, and cancers. The blood tests included in a complete blood count can be broken down into the cell types that they examine and what they measure:    White blood cells.  White blood cell count This is a measurement of the number of white blood cells in a standard volume (concentration) in your blood sample.  White blood cell differential This identifies the types of white blood cells and the concentration of each in the sample of your blood. There are five different types of white blood cells. They all help you fight infection but in different ways. The differential also identifies immature white blood cells.  Red blood cells.  Red blood cell count This is a measurement of the concentration of red blood cells in your blood sample.  Hemoglobin This is a measurement of the amount of hemoglobin in the sample of your blood. This measurement indicates your blood's overall oxygen-carrying capacity.  Hematocrit This is a measurement of the percentage of space that the red blood cells take up in your blood sample.  Mean corpuscular volume This is a measurement of the average size of your red blood cells.  Mean corpuscular hemoglobin This is a measurement of the average amount of hemoglobin inside each of your red blood cells.  Mean corpuscular hemoglobin concentration This is  a calculation of the average concentration of hemoglobin inside each of your red blood cells in your blood sample.  Red blood cell distribution width This is a measurement of the variation in the size of your red blood cells.  Platelets.  The platelet count This is a measurement of the concentration of platelets in your blood sample.  Mean platelet volume This is a measurement of the average size of the platelets in your blood sample.  RESULTS It is your responsibility to obtain your test results. Ask the laboratory or department performing the test when and how you will get your results. Contact your caregiver to discuss any questions you have about your results. Results outside of normal ranges can be an indication of an illness. Examples of abnormal results and possible causes are listed as follows:   White blood cells.  An abnormally low concentration of white blood cells can be caused by certain infections and by conditions that interfere with white blood cell production that happens in the inner part of your bone (bone marrow).  An abnormally high concentration of white blood cells often indicates infections and conditions that cause inflammation. It can also be an indication of blood-related cancer.  Immature white blood cells can indicate an infection or an abnormal condition affecting your bone marrow.  Red blood cells.  An abnormally low concentration of red blood cells, hemoglobin, or hematocrit is called anemia.  An abnormally high concentration of red blood cells, hemoglobin, or hematocrit is called polycythemia. Abnormally  high levels of red blood cells can indicate mild thalassemia. Thalassemia is a type of anemia that is passed down through families (hereditary).  When your mean corpuscular volume is abnormally low, your red blood cells are smaller than normal. This can be caused by thalassemia or iron deficiency anemia. Iron deficiency anemia is a type of anemia that is  the result of a deficiency of a nutrient (deficiency anemia). In this case, the nutrient is iron.  An abnormally high mean corpuscular volume means your red blood cells are larger than normal. This can indicate a deficiency anemia caused by a lack of vitamin B12. An abnormally high mean corpuscular volume also can be caused by a lot of new red blood cells in your blood. This can happen after you have suddenly lost a lot of blood.  Abnormally low mean corpuscular hemoglobin concentration can indicate conditions in which your hemoglobin is abnormally diluted inside the red cells. Examples of these conditions are iron deficiency anemia and thalassemia.  An abnormally high mean corpuscular hemoglobin concentration can indicate the presence of certain hemolytic anemias. Hemolytic anemia is anemia that results from the abnormal breakdown of your red blood cells.  Red cell distribution width is abnormally increased in certain anemias, when new red blood cells are produced after acute blood loss, and with severe thalassemia.  Platelets  An abnormally low concentration of platelets can be a sign of a bleeding disorder.  An abnormally high concentration of platelets can occur with iron deficiency anemia, inflammatory disorders, and cancers or result from physical stresses, such as exercise or blood loss. However, it may also be a sign of a clotting disorder.  New platelets are larger than old platelets. An abnormally high mean platelet volume occurs with a large increase in the number of new platelets being produced by your bone marrow. This can happen after the loss of a large amount of blood or the destruction of your platelets by antibodies. An abnormally high mean platelet volume also can occur with certain bone marrow cancers.  Document Released: 03/08/2004 Document Revised: 08/05/2011 Document Reviewed: 11-Dec-202013 [facility] Patient Information 2014 [place], [place].  Bortezomib injection  What is  this medicine?  BORTEZOMIB (bor TEZ oh mib) is a chemotherapy drug. It slows the growth of cancer cells. This medicine is used to treat multiple myeloma, lymphoma, and other cancers. This medicine may be used for other purposes; ask your health care provider or pharmacist if you have questions.  What should I tell my health care provider before I take this medicine?  They need to know if you have any of these conditions: -heart disease -irregular heartbeat -liver disease -low blood counts, like low white blood cells, platelets, or hemoglobin -peripheral neuropathy -taking medicine for blood pressure -an unusual or allergic reaction to bortezomib, mannitol, boron, other medicines, foods, dyes, or preservatives -pregnant or trying to get pregnant -breast-feeding  How should I use this medicine?  This medicine is for injection into a vein or for injection under the skin. It is given by a health care professional in a hospital or clinic setting. Talk to your pediatrician regarding the use of this medicine in children. Special care may be needed. Overdosage: If you think you have taken too much of this medicine contact a poison control center or emergency room at once. NOTE: This medicine is only for you. Do not share this medicine with others.  What if I miss a dose?  It is important not to miss your dose. Call  your doctor or health care professional if you are unable to keep an appointment.  What may interact with this medicine?  -medicines for diabetes -medicines to increase blood counts like filgrastim, pegfilgrastim, sargramostim -zalcitabine Talk to your doctor or health care professional before taking any of these medicines: -acetaminophen -aspirin -ibuprofen -ketoprofen -naproxen This list may not describe all possible interactions. Give your health care provider a list of all the medicines, herbs, non-prescription drugs, or dietary supplements you use. Also tell them if  you smoke, drink alcohol, or use illegal drugs. Some items may interact with your medicine.  What should I watch for while using this medicine?  Visit your doctor for checks on your progress. This drug may make you feel generally unwell. This is not uncommon, as chemotherapy can affect healthy cells as well as cancer cells. Report any side effects. Continue your course of treatment even though you feel ill unless your doctor tells you to stop. You may get drowsy or dizzy. Do not drive, use machinery, or do anything that needs mental alertness until you know how this medicine affects you. Do not stand or sit up quickly, especially if you are an older patient. This reduces the risk of dizzy or fainting spells. In some cases, you may be given additional medicines to help with side effects. Follow all directions for their use. Call your doctor or health care professional for advice if you get a fever, chills or sore throat, or other symptoms of a cold or flu. Do not treat yourself. This drug decreases your body's ability to fight infections. Try to avoid being around people who are sick. This medicine may increase your risk to bruise or bleed. Call your doctor or health care professional if you notice any unusual bleeding. Be careful brushing and flossing your teeth or using a toothpick because you may get an infection or bleed more easily. If you have any dental work done, tell your dentist you are receiving this medicine. Avoid taking products that contain aspirin, acetaminophen, ibuprofen, naproxen, or ketoprofen unless instructed by your doctor. These medicines may hide a fever. Do not become pregnant while taking this medicine. Women should inform their doctor if they wish to become pregnant or think they might be pregnant. There is a potential for serious side effects to an unborn child. Talk to your health care professional or pharmacist for more information. Do not breast-feed an infant while taking  this medicine. You may have vomiting or diarrhea while taking this medicine. Drink water or other fluids as directed.  What side effects may I notice from receiving this medicine?  Side effects that you should report to your doctor or health care professional as soon as possible: -allergic reactions like skin rash, itching or hives, swelling of the face, lips, or tongue -breathing problems -changes in hearing -changes in vision -fast, irregular heartbeat -feeling faint or lightheaded, falls -pain, tingling, numbness in the hands or feet -seizures -swelling of the ankles, feet, hands -unusual bleeding or bruising -unusually weak or tired -vomiting Side effects that usually do not require medical attention (report to your doctor or health care professional if they continue or are bothersome): -changes in emotions or moods -constipation -diarrhea -loss of appetite -headache -irritation at site where injected -nausea This list may not describe all possible side effects. Call your doctor for medical advice about side effects. You may report side effects to FDA at 1-800-FDA-1088.  Where should I keep my medicine? This drug is given in  a hospital or clinic and will not be stored at home.  NOTE: This sheet is a summary. It may not cover all possible information. If you have questions about this medicine, talk to your doctor, pharmacist, or health care provider.  2013, Elsevier/[employer] Standard. (03/13/2010 11:42:36 AM)  Dexamethasone tablets  What is this medicine?  DEXAMETHASONE (dex a METH a sone) is a corticosteroid. It is commonly used to treat inflammation of the skin, joints, lungs, and other organs. Common conditions treated include asthma, allergies, and arthritis. It is also used for other conditions, such as blood disorders and diseases of the adrenal glands. This medicine may be used for other purposes; ask your health care provider or pharmacist if you have questions.    *This is part of your treatment regimen. It works with the other medications to help kill the myeloma cells.  What should I tell my health care provider before I take this medicine?  They need to know if you have any of these conditions: -Cushing's syndrome -diabetes -glaucoma -heart problems or disease -high blood pressure -infection like herpes, measles, tuberculosis, or chickenpox -kidney disease -liver disease -mental problems -myasthenia gravis -osteoporosis -previous heart attack -seizures -stomach, ulcer or intestine disease including colitis and diverticulitis -thyroid problem -an unusual or allergic reaction to dexamethasone, corticosteroids, other medicines, lactose, foods, dyes, or preservatives -pregnant or trying to get pregnant -breast-feeding  How should I use this medicine?   Take this medicine by mouth with a drink of water. Follow the directions on the prescription label. Take it with food or milk to avoid stomach upset. If you are taking this medicine once a day, take it in the morning. Do not take more medicine than you are told to take. Do not suddenly stop taking your medicine because you may develop a severe reaction. Your doctor will tell you how much medicine to take. If your doctor wants you to stop the medicine, the dose may be slowly lowered over time to avoid any side effects. Talk to your pediatrician regarding the use of this medicine in children. Special care may be needed. Patients over 74 years old may have a stronger reaction and need a smaller dose. Overdosage: If you think you have taken too much of this medicine contact a poison control center or emergency room at once. NOTE: This medicine is only for you. Do not share this medicine with others.  What if I miss a dose?  If you miss a dose, take it as soon as you can. If it is almost time for your next dose, talk to your doctor or health care professional. You may need to miss a dose or take  an extra dose. Do not take double or extra doses without advice.  What may interact with this medicine?  Do not take this medicine with any of the following medications: -mifepristone, RU-486 -vaccines This medicine may also interact with the following medications: -amphotericin B -antibiotics like clarithromycin, erythromycin, and troleandomycin -aspirin and aspirin-like drugs -barbiturates like phenobarbital -carbamazepine -cholestyramine -cholinesterase inhibitors like donepezil, galantamine, rivastigmine, and tacrine -cyclosporine -digoxin -diuretics -ephedrine -female hormones, like estrogens or progestins and birth control pills -indinavir -isoniazid -ketoconazole -medicines for diabetes -medicines that improve muscle tone or strength for conditions like myasthenia gravis -NSAIDs, medicines for pain and inflammation, like ibuprofen or naproxen -phenytoin -rifampin -thalidomide -warfarin This list may not describe all possible interactions. Give your health care provider a list of all the medicines, herbs, non-prescription drugs, or dietary supplements you use.  Also tell them if you smoke, drink alcohol, or use illegal drugs. Some items may interact with your medicine.  What should I watch for while using this medicine?  Visit your doctor or health care professional for regular checks on your progress. If you are taking this medicine over a prolonged period, carry an identification card with your name and address, the type and dose of your medicine, and your doctor's name and address. This medicine may increase your risk of getting an infection. Stay away from people who are sick. Tell your doctor or health care professional if you are around anyone with measles or chickenpox. If you are going to have surgery, tell your doctor or health care professional that you have taken this medicine within the last twelve months. Ask your doctor or health care professional about your  diet. You may need to lower the amount of salt you eat. The medicine can increase your blood sugar. If you are a diabetic check with your doctor if you need help adjusting the dose of your diabetic medicine.  What side effects may I notice from receiving this medicine?  Side effects that you should report to your doctor or health care professional as soon as possible: -allergic reactions like skin rash, itching or hives, swelling of the face, lips, or tongue -changes in vision -fever, sore throat, sneezing, cough, or other signs of infection, wounds that will not heal -increased thirst -mental depression, mood swings, mistaken feelings of self importance or of being mistreated -pain in hips, back, ribs, arms, shoulders, or legs -redness, blistering, peeling or loosening of the skin, including inside the mouth -trouble passing urine or change in the amount of urine -swelling of feet or lower legs -unusual bleeding or bruising Side effects that usually do not require medical attention (report to your doctor or health care professional if they continue or are bothersome): -headache -nausea, vomiting -skin problems, acne, thin and shiny skin -weight gain This list may not describe all possible side effects. Call your doctor for medical advice about side effects. You may report side effects to FDA at 1-800-FDA-1088.  Where should I keep my medicine?  Keep out of the reach of children. Store at room temperature between 20 and 25 degrees C (68 and 77 degrees F). Protect from light. Throw away any unused medicine after the expiration date. NOTE: This sheet is a summary. It may not cover all possible information. If you have questions about this medicine, talk to your doctor, pharmacist, or health care provider.  2013, Elsevier/Gold Standard. (05/27/2007 2:02:13 PM)

## 2012-07-13 NOTE — Progress Notes (Signed)
CC:   [name], M.D.  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  CURRENT THERAPY:  Observation.  INTERIM HISTORY:  Ms. Orson Slick comes in for followup.  We did a bone marrow biopsy and aspirate on her a week ago.  This was done on May 13.  Surprisingly enough, the pathology report ([id number]) showed plasma cell neoplasm.  She had 18% plasma cells.  Plasma cells were lambda light chain restricted.  This was a surprising result.  We did the bone marrow biopsy and aspirate because of progressive leukopenia.  She feels well.  She is still working.  She has not had any kind of bony pain.  She has had no fever, sweats or chills.  She did have a bone survey today.  Bone survey showed a questionable lesion in the posterior calvarium.  Otherwise her bones looked real good.  She has had no problems with bowels or bladder.  There have been no rashes.  She has had, again, occasional headache.  PHYSICAL EXAMINATION:  General:  This is a well-developed, well- nourished white female in no obvious distress.  Vital signs:  Show a temperature of 98.7, pulse 87, respiratory rate 16, blood pressure 124/80.  Weight is 182.  Head and neck:  Shows a normocephalic, atraumatic skull.  There are no ocular or oral lesions.  There are no palpable cervical or supraclavicular lymph nodes.  Lungs:  Clear bilaterally.  Cardiac:  Regular rate and rhythm with a normal S1, S2. There are no murmurs, rubs or bruits.  Abdomen:  Soft with good bowel sounds.  There is no palpable abdominal mass.  There is no fluid wave. No palpable hepatosplenomegaly is noted.  Back:  No tenderness over the spine, ribs or hips.  Extremities:  Show no clubbing, cyanosis or edema.  LABORATORY STUDIES:  White cell count is 1.7, hemoglobin 11.5, hematocrit 33.6, platelet count 266.  Sodium 138, potassium 3.9, BUN 7, creatinine 0.7.  Albumin is 3.7.  Calcium is 9.1.  Total protein is 7.2.  IMPRESSION:  Ms. Orson Slick is a  62 year old white female with myeloma. Again, I am not sure what the heavy chain is for her right now.  I am sending off myeloma studies on her.  I think that with the progressive leukopenia, we are going to have to get her on some kind of therapy.  I believe that Revlimid with Velcade would be appropriate for her.  I do not see a need for Zometa given that her bones look okay.  I do realize that she has the leukopenia.  This certainly could worsen before it gets better.  We will go ahead and try to get her started in a week or so.  I want to try to coordinate the Revlimid and the Velcade. The Velcade will be 3 weeks on and 1 week off.  We will probably give her some Decadron with the Velcade.  I spent a good 45 minutes or so with [name].  I explained to her what I thought was the issue.  She understands this well.  I answered all of her questions.  We will dose the Revlimid at 25 mg a day for 3 weeks on and 1 week off.  The Velcade, again, will be a regular dose weekly for 3 weeks on and 1 week off.  The Decadron will be 20 mg orally weekly with the Velcade.  I will plan to get her back to see me in about a month or so.  ______________________________ Josph Macho, M.D. PRE/MEDQ  D:  07/13/2012  T:  07/13/2012  Job:  [id number]

## 2012-07-15 LAB — PROTEIN ELECTROPHORESIS, SERUM, WITH REFLEX
Alpha-1-Globulin: 3.3 % (ref 2.9–4.9)
Gamma Globulin: 19.1 % — ABNORMAL HIGH (ref 11.1–18.8)
Total Protein, Serum Electrophoresis: 7.2 g/dL (ref 6.0–8.3)

## 2012-07-15 LAB — IGG, IGA, IGM: IgA: 72 mg/dL (ref 69–380)

## 2012-07-15 LAB — KAPPA/LAMBDA LIGHT CHAINS
Kappa:Lambda Ratio: 0.57 (ref 0.26–1.65)
Lambda Free Lght Chn: 1.59 mg/dL (ref 0.57–2.63)

## 2012-07-16 ENCOUNTER — Other Ambulatory Visit: Payer: Self-pay | Admitting: *Deleted

## 2012-07-16 DIAGNOSIS — C9000 Multiple myeloma not having achieved remission: Secondary | ICD-10-CM

## 2012-07-16 MED ORDER — LENALIDOMIDE 25 MG PO CAPS: 25.0000 mg | ORAL_CAPSULE | Freq: Every day | ORAL | Status: DC

## 2012-07-16 NOTE — Telephone Encounter (Signed)
Faxed rx for Revlimid 25 mg capsules 1 po daily 21 days on 7 days off to [facility]. Obtained auth # [id number].

## 2012-07-19 ENCOUNTER — Other Ambulatory Visit: Payer: BC Managed Care – PPO

## 2012-07-20 ENCOUNTER — Encounter: Payer: Self-pay | Admitting: *Deleted

## 2012-07-20 NOTE — Progress Notes (Signed)
Received confirmation of Revlimid shipment.

## 2012-07-21 ENCOUNTER — Ambulatory Visit (HOSPITAL_BASED_OUTPATIENT_CLINIC_OR_DEPARTMENT_OTHER): Payer: BC Managed Care – PPO | Admitting: Lab

## 2012-07-21 ENCOUNTER — Ambulatory Visit (HOSPITAL_BASED_OUTPATIENT_CLINIC_OR_DEPARTMENT_OTHER): Payer: BC Managed Care – PPO

## 2012-07-21 VITALS — BP 118/79 | HR 99 | Temp 97.6°F

## 2012-07-21 DIAGNOSIS — C9000 Multiple myeloma not having achieved remission: Secondary | ICD-10-CM

## 2012-07-21 DIAGNOSIS — Z5112 Encounter for antineoplastic immunotherapy: Secondary | ICD-10-CM

## 2012-07-21 LAB — CBC WITH DIFFERENTIAL (CANCER CENTER ONLY)
BASO%: 0.3 % (ref 0.0–2.0)
EOS%: 0.3 % (ref 0.0–7.0)
HCT: 35.5 % (ref 34.8–46.6)
LYMPH%: 67.4 % — ABNORMAL HIGH (ref 14.0–48.0)
MCHC: 34.4 g/dL (ref 32.0–36.0)
MCV: 98 fL (ref 81–101)
MONO%: 5.2 % (ref 0.0–13.0)
NEUT%: 26.8 % — ABNORMAL LOW (ref 39.6–80.0)
RDW: 13 % (ref 11.1–15.7)

## 2012-07-21 MED ORDER — BORTEZOMIB CHEMO SQ INJECTION 3.5 MG (2.5MG/ML)
1.3000 mg/m2 | Freq: Once | INTRAMUSCULAR | Status: AC
  Administered 2012-07-21: 2.75 mg via SUBCUTANEOUS
  Filled 2012-07-21: qty 2.75

## 2012-07-21 MED ORDER — ONDANSETRON HCL 8 MG PO TABS
8.0000 mg | ORAL_TABLET | Freq: Once | ORAL | Status: AC
  Administered 2012-07-21: 8 mg via ORAL

## 2012-07-21 MED ORDER — DEXAMETHASONE 4 MG PO TABS
20.0000 mg | ORAL_TABLET | Freq: Once | ORAL | Status: AC
  Administered 2012-07-21: 20 mg via ORAL

## 2012-07-21 NOTE — Patient Instructions (Addendum)
[facility] Discharge Instructions for Patients Receiving Chemotherapy  Today you received the following chemotherapy agents: Velcade.  To help prevent nausea and vomiting after your treatment, we encourage you to take your nausea medication as prescribed.   If you develop nausea and vomiting that is not controlled by your nausea medication, call the clinic.   BELOW ARE SYMPTOMS THAT SHOULD BE REPORTED IMMEDIATELY:  *FEVER GREATER THAN 100.5 F  *CHILLS WITH OR WITHOUT FEVER  NAUSEA AND VOMITING THAT IS NOT CONTROLLED WITH YOUR NAUSEA MEDICATION  *UNUSUAL SHORTNESS OF BREATH  *UNUSUAL BRUISING OR BLEEDING  TENDERNESS IN MOUTH AND THROAT WITH OR WITHOUT PRESENCE OF ULCERS  *URINARY PROBLEMS  *BOWEL PROBLEMS  UNUSUAL RASH Items with * indicate a potential emergency and should be followed up as soon as possible.  Feel free to call the clinic you have any questions or concerns. The clinic phone number is [phone].    

## 2012-07-22 ENCOUNTER — Telehealth: Payer: Self-pay | Admitting: Hematology & Oncology

## 2012-07-22 NOTE — Telephone Encounter (Signed)
Patient has been APPROVED  For co-pay assistance $25 w [employer]IENT [employer] for her REVLIMID ([id number]) - oral med.   COPY SCANNED

## 2012-07-26 ENCOUNTER — Encounter: Payer: Self-pay | Admitting: Hematology & Oncology

## 2012-07-27 ENCOUNTER — Encounter: Payer: Self-pay | Admitting: Hematology & Oncology

## 2012-07-28 ENCOUNTER — Ambulatory Visit (HOSPITAL_BASED_OUTPATIENT_CLINIC_OR_DEPARTMENT_OTHER): Payer: BC Managed Care – PPO | Admitting: Lab

## 2012-07-28 ENCOUNTER — Ambulatory Visit (HOSPITAL_BASED_OUTPATIENT_CLINIC_OR_DEPARTMENT_OTHER): Payer: BC Managed Care – PPO

## 2012-07-28 VITALS — BP 116/71 | HR 88 | Temp 98.9°F

## 2012-07-28 DIAGNOSIS — C9000 Multiple myeloma not having achieved remission: Secondary | ICD-10-CM

## 2012-07-28 DIAGNOSIS — Z5112 Encounter for antineoplastic immunotherapy: Secondary | ICD-10-CM

## 2012-07-28 LAB — CBC WITH DIFFERENTIAL (CANCER CENTER ONLY)
EOS%: 0.6 % (ref 0.0–7.0)
MCH: 33.5 pg (ref 26.0–34.0)
MCHC: 34.2 g/dL (ref 32.0–36.0)
MONO%: 6.3 % (ref 0.0–13.0)
NEUT#: 0.4 10*3/uL — CL (ref 1.5–6.5)
Platelets: 180 10*3/uL (ref 145–400)
RBC: 3.28 10*6/uL — ABNORMAL LOW (ref 3.70–5.32)

## 2012-07-28 MED ORDER — DEXAMETHASONE 4 MG PO TABS
20.0000 mg | ORAL_TABLET | Freq: Once | ORAL | Status: AC
  Administered 2012-07-28: 20 mg via ORAL

## 2012-07-28 MED ORDER — ONDANSETRON HCL 8 MG PO TABS
8.0000 mg | ORAL_TABLET | Freq: Once | ORAL | Status: AC
  Administered 2012-07-28: 8 mg via ORAL

## 2012-07-28 MED ORDER — BORTEZOMIB CHEMO SQ INJECTION 3.5 MG (2.5MG/ML)
1.3000 mg/m2 | Freq: Once | INTRAMUSCULAR | Status: AC
  Administered 2012-07-28: 2.75 mg via SUBCUTANEOUS
  Filled 2012-07-28: qty 2.75

## 2012-07-28 NOTE — Patient Instructions (Signed)
[facility] Discharge Instructions for Patients Receiving Chemotherapy  Today you received the following chemotherapy agents: Velcade.  To help prevent nausea and vomiting after your treatment, we encourage you to take your nausea medication as prescribed.   If you develop nausea and vomiting that is not controlled by your nausea medication, call the clinic.   BELOW ARE SYMPTOMS THAT SHOULD BE REPORTED IMMEDIATELY:  *FEVER GREATER THAN 100.5 F  *CHILLS WITH OR WITHOUT FEVER  NAUSEA AND VOMITING THAT IS NOT CONTROLLED WITH YOUR NAUSEA MEDICATION  *UNUSUAL SHORTNESS OF BREATH  *UNUSUAL BRUISING OR BLEEDING  TENDERNESS IN MOUTH AND THROAT WITH OR WITHOUT PRESENCE OF ULCERS  *URINARY PROBLEMS  *BOWEL PROBLEMS  UNUSUAL RASH Items with * indicate a potential emergency and should be followed up as soon as possible.  Feel free to call the clinic you have any questions or concerns. The clinic phone number is [phone].    

## 2012-08-03 ENCOUNTER — Other Ambulatory Visit: Payer: Self-pay | Admitting: Family Medicine

## 2012-08-03 ENCOUNTER — Other Ambulatory Visit: Payer: Self-pay | Admitting: *Deleted

## 2012-08-03 DIAGNOSIS — C9000 Multiple myeloma not having achieved remission: Secondary | ICD-10-CM

## 2012-08-04 ENCOUNTER — Other Ambulatory Visit (HOSPITAL_BASED_OUTPATIENT_CLINIC_OR_DEPARTMENT_OTHER): Payer: BC Managed Care – PPO | Admitting: Lab

## 2012-08-04 ENCOUNTER — Ambulatory Visit (HOSPITAL_BASED_OUTPATIENT_CLINIC_OR_DEPARTMENT_OTHER): Payer: BC Managed Care – PPO

## 2012-08-04 VITALS — BP 120/73 | HR 86 | Temp 98.4°F

## 2012-08-04 DIAGNOSIS — Z5112 Encounter for antineoplastic immunotherapy: Secondary | ICD-10-CM

## 2012-08-04 DIAGNOSIS — C9000 Multiple myeloma not having achieved remission: Secondary | ICD-10-CM

## 2012-08-04 LAB — CBC WITH DIFFERENTIAL (CANCER CENTER ONLY)
Eosinophils Absolute: 0 10*3/uL (ref 0.0–0.5)
LYMPH#: 1.2 10*3/uL (ref 0.9–3.3)
MONO#: 0.1 10*3/uL (ref 0.1–0.9)
NEUT#: 0.3 10*3/uL — CL (ref 1.5–6.5)
Platelets: 171 10*3/uL (ref 145–400)
RBC: 3.27 10*6/uL — ABNORMAL LOW (ref 3.70–5.32)
WBC: 1.6 10*3/uL — ABNORMAL LOW (ref 3.9–10.0)

## 2012-08-04 MED ORDER — DEXAMETHASONE 4 MG PO TABS
20.0000 mg | ORAL_TABLET | Freq: Once | ORAL | Status: AC
Start: 2012-08-04 — End: 2012-08-04
  Administered 2012-08-04: 20 mg via ORAL

## 2012-08-04 MED ORDER — BORTEZOMIB CHEMO SQ INJECTION 3.5 MG (2.5MG/ML)
1.3000 mg/m2 | Freq: Once | INTRAMUSCULAR | Status: AC
  Administered 2012-08-04: 2.75 mg via SUBCUTANEOUS
  Filled 2012-08-04: qty 2.75

## 2012-08-04 MED ORDER — ONDANSETRON HCL 8 MG PO TABS
8.0000 mg | ORAL_TABLET | Freq: Once | ORAL | Status: AC
  Administered 2012-08-04: 8 mg via ORAL

## 2012-08-04 NOTE — Patient Instructions (Signed)

## 2012-08-05 ENCOUNTER — Encounter: Payer: Self-pay | Admitting: Hematology & Oncology

## 2012-08-10 ENCOUNTER — Other Ambulatory Visit: Payer: Self-pay | Admitting: *Deleted

## 2012-08-10 DIAGNOSIS — C9000 Multiple myeloma not having achieved remission: Secondary | ICD-10-CM

## 2012-08-10 MED ORDER — LENALIDOMIDE 25 MG PO CAPS
25.0000 mg | ORAL_CAPSULE | Freq: Every day | ORAL | Status: DC
Start: 2012-08-10 — End: 2012-09-06

## 2012-08-17 ENCOUNTER — Other Ambulatory Visit: Payer: Self-pay | Admitting: *Deleted

## 2012-08-17 DIAGNOSIS — C9000 Multiple myeloma not having achieved remission: Secondary | ICD-10-CM

## 2012-08-18 ENCOUNTER — Other Ambulatory Visit (HOSPITAL_BASED_OUTPATIENT_CLINIC_OR_DEPARTMENT_OTHER): Payer: BC Managed Care – PPO | Admitting: Lab

## 2012-08-18 ENCOUNTER — Ambulatory Visit (HOSPITAL_BASED_OUTPATIENT_CLINIC_OR_DEPARTMENT_OTHER): Payer: BC Managed Care – PPO

## 2012-08-18 ENCOUNTER — Ambulatory Visit (HOSPITAL_BASED_OUTPATIENT_CLINIC_OR_DEPARTMENT_OTHER): Payer: BC Managed Care – PPO | Admitting: Hematology & Oncology

## 2012-08-18 VITALS — BP 122/78 | HR 102 | Temp 98.6°F | Resp 16 | Ht 69.0 in | Wt 184.0 lb

## 2012-08-18 DIAGNOSIS — C9000 Multiple myeloma not having achieved remission: Secondary | ICD-10-CM

## 2012-08-18 DIAGNOSIS — Z5112 Encounter for antineoplastic immunotherapy: Secondary | ICD-10-CM

## 2012-08-18 DIAGNOSIS — D72819 Decreased white blood cell count, unspecified: Secondary | ICD-10-CM

## 2012-08-18 LAB — CBC WITH DIFFERENTIAL (CANCER CENTER ONLY)
BASO%: 1.6 % (ref 0.0–2.0)
LYMPH%: 66 % — ABNORMAL HIGH (ref 14.0–48.0)
MCH: 33.6 pg (ref 26.0–34.0)
MCV: 100 fL (ref 81–101)
MONO#: 0.3 10*3/uL (ref 0.1–0.9)
MONO%: 9.8 % (ref 0.0–13.0)
NEUT#: 0.6 10*3/uL — ABNORMAL LOW (ref 1.5–6.5)
Platelets: 309 10*3/uL (ref 145–400)
RBC: 3.27 10*6/uL — ABNORMAL LOW (ref 3.70–5.32)
RDW: 13.5 % (ref 11.1–15.7)
WBC: 2.6 10*3/uL — ABNORMAL LOW (ref 3.9–10.0)

## 2012-08-18 MED ORDER — BORTEZOMIB CHEMO SQ INJECTION 3.5 MG (2.5MG/ML)
1.3000 mg/m2 | Freq: Once | INTRAMUSCULAR | Status: AC
  Administered 2012-08-18: 2.75 mg via SUBCUTANEOUS
  Filled 2012-08-18: qty 2.75

## 2012-08-18 MED ORDER — DEXAMETHASONE 4 MG PO TABS
20.0000 mg | ORAL_TABLET | Freq: Once | ORAL | Status: AC
  Administered 2012-08-18: 20 mg via ORAL

## 2012-08-18 MED ORDER — ONDANSETRON HCL 8 MG PO TABS
8.0000 mg | ORAL_TABLET | Freq: Once | ORAL | Status: AC
  Administered 2012-08-18: 8 mg via ORAL

## 2012-08-18 NOTE — Progress Notes (Signed)
This office note has been dictated.

## 2012-08-18 NOTE — Patient Instructions (Addendum)
[facility] Discharge Instructions for Patients Receiving Chemotherapy  Today you received the following chemotherapy agents: Velcade. To help prevent nausea and vomiting after your treatment, we encourage you to take your nausea medication.  If you develop nausea and vomiting that is not controlled by your nausea medication, call the clinic.   BELOW ARE SYMPTOMS THAT SHOULD BE REPORTED IMMEDIATELY:  *FEVER GREATER THAN 100.5 F  *CHILLS WITH OR WITHOUT FEVER  NAUSEA AND VOMITING THAT IS NOT CONTROLLED WITH YOUR NAUSEA MEDICATION  *UNUSUAL SHORTNESS OF BREATH  *UNUSUAL BRUISING OR BLEEDING  TENDERNESS IN MOUTH AND THROAT WITH OR WITHOUT PRESENCE OF ULCERS  *URINARY PROBLEMS  *BOWEL PROBLEMS  UNUSUAL RASH Items with * indicate a potential emergency and should be followed up as soon as possible.  Feel free to call the clinic you have any questions or concerns. The clinic phone number is [phone].    

## 2012-08-19 ENCOUNTER — Ambulatory Visit (INDEPENDENT_AMBULATORY_CARE_PROVIDER_SITE_OTHER): Payer: BC Managed Care – PPO | Admitting: Family Medicine

## 2012-08-19 ENCOUNTER — Encounter: Payer: Self-pay | Admitting: Family Medicine

## 2012-08-19 VITALS — BP 123/70 | HR 101 | Wt 184.0 lb

## 2012-08-19 DIAGNOSIS — F3289 Other specified depressive episodes: Secondary | ICD-10-CM

## 2012-08-19 DIAGNOSIS — Z0000 Encounter for general adult medical examination without abnormal findings: Secondary | ICD-10-CM

## 2012-08-19 DIAGNOSIS — F32A Depression, unspecified: Secondary | ICD-10-CM

## 2012-08-19 DIAGNOSIS — G43909 Migraine, unspecified, not intractable, without status migrainosus: Secondary | ICD-10-CM

## 2012-08-19 DIAGNOSIS — F329 Major depressive disorder, single episode, unspecified: Secondary | ICD-10-CM

## 2012-08-19 MED ORDER — CITALOPRAM HYDROBROMIDE 10 MG PO TABS: 10.0000 mg | ORAL_TABLET | Freq: Every morning | ORAL | Status: DC

## 2012-08-19 NOTE — Progress Notes (Signed)
CC:   [name], M.D.  DIAGNOSES: 1. Immunoglobulin G lambda myeloma. 2. Leukopenia.  CURRENT THERAPY:  Patient receiving Velcade/Revlimid/Decadron.  INTERIM HISTORY:  Ms. Orson Slick comes in for followup.  She is doing okay. She has tolerated her chemotherapy quite well.  She really had no problems with her chemotherapy.  These been no nausea or vomiting.  She has had some fatigue.  She has had no bleeding.  There has been no change in bowel or bladder habits.  There may be a little constipation with the Revlimid.  She is on Famvir.  She is also on aspirin.  There has been no problems with leg swelling.  She has had no rashes.  We are checking her myeloma studies today to see what kind of response we have gotten.  PHYSICAL EXAMINATION:  General:  This is a well-developed, well- nourished white female in no obvious distress.  Vital Signs: Temperature of 98.6, pulse 102, respiratory rate 16, blood pressure 122/78.  Weight is 184.  Head and Neck:  Normocephalic, atraumatic skull.  There are no ocular or oral lesions.  There are no palpable cervical or supraclavicular lymph nodes.  Lungs:  Clear bilaterally. Cardiac:  Regular rate and rhythm with a normal S1, S2.  There are no murmurs, rubs or bruits.  Abdomen:  Soft with good bowel sounds.  There is no palpable abdominal mass.  There is no fluid wave.  No palpable hepatosplenomegaly.  Back:  No tenderness of the spine, ribs, or hips. Extremities:  No clubbing, cyanosis, or edema.  Skin:  No suspicious hyperpigmented lesions.  LABORATORY STUDIES:  White cell count is 2.6, hemoglobin 11, hematocrit 32.7, platelet count 309.  IMPRESSION:  Ms. Orson Slick is a very charming 62 year old white female with IgG lambda myeloma.  We did a bone marrow on her.  This showed 18% plasma cells.  She does have a little questionable lesion in the calvarium.  It is still unclear whether or not the leukopenia is from the myeloma or not.   Hopefully, we will see her white cell count improving.  We will see what her myeloma studies show when we recheck them this week.  Assuming it is possible, we might be able to make some adjustments with her treatment program.  We will go ahead and plan to get her back to see me in another month.    ______________________________ Josph Macho, M.D. PRE/MEDQ  D:  08/18/2012  T:  08/19/2012  Job:  [id number]

## 2012-08-19 NOTE — Progress Notes (Signed)
  Subjective:    Patient ID: Nancy Parks, female    DOB: 06-Aug-1950, 62 y.o.   MRN: [id number]  HPI Here to followup for depression area I saw her approximately 6 months ago. We decreased her citalopram at that time because she fell she was doing well. She is still seeing oncology regularly for multiple myeloma.She ws hoping to be off her antidepressant by now. She feels that most of her symptoms which are primarily fatigue and increase slowly and sometimes feeling down is more related to her anemia than her actual mood. She says she really has had a positive outlook about this whole diagnosis.  Migraines are well controlled. Wants to continue the amitrityptyline.   Review of Systems     Objective:   Physical Exam  Constitutional: She is oriented to person, place, and time. She appears well-developed and well-nourished.  HENT:  Head: Normocephalic and atraumatic.  Cardiovascular: Normal rate, regular rhythm and normal heart sounds.   Pulmonary/Chest: Effort normal and breath sounds normal.  Neurological: She is alert and oriented to person, place, and time.  Skin: Skin is warm and dry.  Psychiatric: She has a normal mood and affect. Her behavior is normal.          Assessment & Plan:  Depression-PHQ 9 score of 4. We discussed different options. She certainly can wean off the medication a time or discontinue her regimen for now as she is still under a fair amount of stress with treatment for her mitral myeloma though she really has had a fantastic and positive outlook. She decided she would like to continue the medication for now. We'll see her back in 6 months. If in the interim she would like to discontinue it I encouraged her call me so that I can tell her how to taper the medication.  Migarines - well controlled.  Continue amitriptyline for now.

## 2012-08-19 NOTE — Patient Instructions (Signed)
Please schedule your Wellness Exam with pap smear. Think about mammogram.

## 2012-08-23 LAB — KAPPA/LAMBDA LIGHT CHAINS: Kappa:Lambda Ratio: 0.68 (ref 0.26–1.65)

## 2012-08-23 LAB — SPEP & IFE WITH QIG
Albumin ELP: 58.9 % (ref 55.8–66.1)
Alpha-1-Globulin: 7.3 % — ABNORMAL HIGH (ref 2.9–4.9)
Beta 2: 2.9 % — ABNORMAL LOW (ref 3.2–6.5)
IgM, Serum: 88 mg/dL (ref 52–322)

## 2012-08-23 LAB — COMPREHENSIVE METABOLIC PANEL
AST: 23 U/L (ref 0–37)
Albumin: 4.2 g/dL (ref 3.5–5.2)
Alkaline Phosphatase: 107 U/L (ref 39–117)
BUN: 10 mg/dL (ref 6–23)
Potassium: 4.1 mEq/L (ref 3.5–5.3)
Sodium: 140 mEq/L (ref 135–145)
Total Protein: 6.5 g/dL (ref 6.0–8.3)

## 2012-08-25 ENCOUNTER — Ambulatory Visit (HOSPITAL_BASED_OUTPATIENT_CLINIC_OR_DEPARTMENT_OTHER): Payer: BC Managed Care – PPO

## 2012-08-25 ENCOUNTER — Ambulatory Visit (HOSPITAL_BASED_OUTPATIENT_CLINIC_OR_DEPARTMENT_OTHER): Payer: BC Managed Care – PPO | Admitting: Lab

## 2012-08-25 VITALS — BP 121/77 | HR 86 | Temp 99.0°F | Resp 20

## 2012-08-25 DIAGNOSIS — C9000 Multiple myeloma not having achieved remission: Secondary | ICD-10-CM

## 2012-08-25 DIAGNOSIS — Z5112 Encounter for antineoplastic immunotherapy: Secondary | ICD-10-CM

## 2012-08-25 DIAGNOSIS — D72819 Decreased white blood cell count, unspecified: Secondary | ICD-10-CM

## 2012-08-25 LAB — CBC WITH DIFFERENTIAL (CANCER CENTER ONLY)
EOS%: 3 % (ref 0.0–7.0)
Eosinophils Absolute: 0.1 10*3/uL (ref 0.0–0.5)
LYMPH#: 1.3 10*3/uL (ref 0.9–3.3)
MCH: 33.7 pg (ref 26.0–34.0)
MCHC: 34 g/dL (ref 32.0–36.0)
MONO%: 9.4 % (ref 0.0–13.0)
NEUT#: 0.5 10*3/uL — CL (ref 1.5–6.5)
Platelets: 206 10*3/uL (ref 145–400)
RBC: 3.32 10*6/uL — ABNORMAL LOW (ref 3.70–5.32)

## 2012-08-25 MED ORDER — ONDANSETRON HCL 8 MG PO TABS
8.0000 mg | ORAL_TABLET | Freq: Once | ORAL | Status: AC
  Administered 2012-08-25: 8 mg via ORAL

## 2012-08-25 MED ORDER — DEXAMETHASONE 4 MG PO TABS
20.0000 mg | ORAL_TABLET | Freq: Once | ORAL | Status: AC
  Administered 2012-08-25: 20 mg via ORAL

## 2012-08-25 MED ORDER — BORTEZOMIB CHEMO SQ INJECTION 3.5 MG (2.5MG/ML)
1.3000 mg/m2 | Freq: Once | INTRAMUSCULAR | Status: AC
  Administered 2012-08-25: 2.75 mg via SUBCUTANEOUS
  Filled 2012-08-25: qty 2.75

## 2012-08-25 NOTE — Patient Instructions (Signed)

## 2012-08-27 ENCOUNTER — Telehealth: Payer: Self-pay | Admitting: *Deleted

## 2012-08-31 ENCOUNTER — Encounter: Payer: Self-pay | Admitting: *Deleted

## 2012-08-31 ENCOUNTER — Other Ambulatory Visit: Payer: Self-pay | Admitting: Medical

## 2012-08-31 DIAGNOSIS — C9000 Multiple myeloma not having achieved remission: Secondary | ICD-10-CM

## 2012-09-01 ENCOUNTER — Other Ambulatory Visit (HOSPITAL_BASED_OUTPATIENT_CLINIC_OR_DEPARTMENT_OTHER): Payer: BC Managed Care – PPO | Admitting: Lab

## 2012-09-01 ENCOUNTER — Other Ambulatory Visit: Payer: Self-pay | Admitting: Family Medicine

## 2012-09-01 ENCOUNTER — Other Ambulatory Visit: Payer: Self-pay | Admitting: Medical

## 2012-09-01 ENCOUNTER — Ambulatory Visit (HOSPITAL_BASED_OUTPATIENT_CLINIC_OR_DEPARTMENT_OTHER): Payer: BC Managed Care – PPO

## 2012-09-01 VITALS — BP 121/77 | HR 86 | Temp 97.4°F | Resp 20

## 2012-09-01 DIAGNOSIS — Z5112 Encounter for antineoplastic immunotherapy: Secondary | ICD-10-CM

## 2012-09-01 DIAGNOSIS — C9000 Multiple myeloma not having achieved remission: Secondary | ICD-10-CM

## 2012-09-01 LAB — CBC WITH DIFFERENTIAL (CANCER CENTER ONLY)
Eosinophils Absolute: 0.1 10*3/uL (ref 0.0–0.5)
MONO#: 0.1 10*3/uL (ref 0.1–0.9)
NEUT#: 0.4 10*3/uL — CL (ref 1.5–6.5)
Platelets: 159 10*3/uL (ref 145–400)
RBC: 3.11 10*6/uL — ABNORMAL LOW (ref 3.70–5.32)
WBC: 1.7 10*3/uL — ABNORMAL LOW (ref 3.9–10.0)

## 2012-09-01 MED ORDER — ONDANSETRON HCL 8 MG PO TABS
8.0000 mg | ORAL_TABLET | Freq: Once | ORAL | Status: AC
  Administered 2012-09-01: 8 mg via ORAL

## 2012-09-01 MED ORDER — DEXAMETHASONE 4 MG PO TABS
20.0000 mg | ORAL_TABLET | Freq: Once | ORAL | Status: AC
  Administered 2012-09-01: 20 mg via ORAL

## 2012-09-01 MED ORDER — BORTEZOMIB CHEMO SQ INJECTION 3.5 MG (2.5MG/ML)
1.3000 mg/m2 | Freq: Once | INTRAMUSCULAR | Status: AC
  Administered 2012-09-01: 2.75 mg via SUBCUTANEOUS
  Filled 2012-09-01: qty 2.75

## 2012-09-01 NOTE — Progress Notes (Signed)
Discussed labs with Eunice Blase, PA, OK to treat today.

## 2012-09-01 NOTE — Patient Instructions (Signed)

## 2012-09-02 ENCOUNTER — Other Ambulatory Visit (HOSPITAL_COMMUNITY)
Admission: RE | Admit: 2012-09-02 | Discharge: 2012-09-02 | Disposition: A | Discharge status: Home / Self Care | Payer: BC Managed Care – PPO | Source: Ambulatory Visit | Attending: Family Medicine | Admitting: Family Medicine

## 2012-09-02 ENCOUNTER — Ambulatory Visit (INDEPENDENT_AMBULATORY_CARE_PROVIDER_SITE_OTHER): Payer: BC Managed Care – PPO | Admitting: Family Medicine

## 2012-09-02 ENCOUNTER — Encounter: Payer: Self-pay | Admitting: Family Medicine

## 2012-09-02 VITALS — BP 117/60 | HR 93 | Ht 69.6 in | Wt 185.0 lb

## 2012-09-02 DIAGNOSIS — Z0000 Encounter for general adult medical examination without abnormal findings: Secondary | ICD-10-CM

## 2012-09-02 DIAGNOSIS — G589 Mononeuropathy, unspecified: Secondary | ICD-10-CM

## 2012-09-02 DIAGNOSIS — Z124 Encounter for screening for malignant neoplasm of cervix: Secondary | ICD-10-CM

## 2012-09-02 DIAGNOSIS — Z1231 Encounter for screening mammogram for malignant neoplasm of breast: Secondary | ICD-10-CM

## 2012-09-02 DIAGNOSIS — Z01419 Encounter for gynecological examination (general) (routine) without abnormal findings: Secondary | ICD-10-CM | POA: Insufficient documentation

## 2012-09-02 DIAGNOSIS — G629 Polyneuropathy, unspecified: Secondary | ICD-10-CM

## 2012-09-02 NOTE — Patient Instructions (Signed)
Keep up a regular exercise program and make sure you are eating a healthy diet Try to eat 4 servings of dairy a day, or if you are lactose intolerant take a calcium with vitamin D daily.  Your vaccines are up to date.   

## 2012-09-02 NOTE — Progress Notes (Signed)
  Subjective:     Nancy Parks is a 62 y.o. female and is here for a comprehensive physical exam. The patient reports problems - some new red spots. appt with derm not until oct. Wants to know ifshould go earlier.  Sister with hx of melanoma.  History   Social History  . Marital Status: Divorced    Spouse Name: N/A    Number of Children: N/A  . Years of Education: N/A   Occupational History  . accountant   . Life coach    Social History Main Topics  . Smoking status: Never Smoker   . Smokeless tobacco: Never Used  . Alcohol Use: No  . Drug Use: No  . Sexually Active: Yes -- Female partner(s)   Other Topics Concern  . Not on file   Social History Narrative   No exercise.  Doing 10 min of yoga.    Health Maintenance  Topic Date Due  . Mammogram  11/17/2012  . Zostavax  06/04/2016  . Influenza Vaccine  10/18/2012  . Pap Smear  03/03/2014  . Colonoscopy  02/18/2019  . Tetanus/tdap  07/11/2020    The following portions of the patient's history were reviewed and updated as appropriate: allergies, current medications, past family history, past medical history, past social history, past surgical history and problem list.  Review of Systems A comprehensive review of systems was negative.   Objective:    BP 117/60  Pulse 93  Ht 5' 9.6" (1.768 m)  Wt 185 lb (83.915 kg)  BMI 26.85 kg/m2 General appearance: alert, cooperative and appears stated age Head: Normocephalic, without obvious abnormality, atraumatic Eyes: conj clear, wearing glasses, EOMi, PEERLA Ears: normal TM's and external ear canals both ears Nose: Nares normal. Septum midline. Mucosa normal. No drainage or sinus tenderness. Throat: lips, mucosa, and tongue normal; teeth and gums normal Neck: no adenopathy, no carotid bruit, no JVD, supple, symmetrical, trachea midline and thyroid not enlarged, symmetric, no tenderness/mass/nodules Back: symmetric, no curvature. ROM normal. No CVA tenderness. Lungs: clear to  auscultation bilaterally Breasts: normal appearance, no masses or tenderness Heart: regular rate and rhythm, S1, S2 normal, no murmur, click, rub or gallop Abdomen: soft, non-tender; bowel sounds normal; no masses,  no organomegaly Pelvic: external genitalia normal, no adnexal masses or tenderness, no cervical motion tenderness, rectovaginal septum normal, uterus normal size, shape, and consistency, vagina normal without discharge and cervix is stenotic Extremities: extremities normal, atraumatic, no cyanosis or edema Pulses: 2+ and symmetric Skin: Skin color, texture, turgor normal. No rashes or lesions Lymph nodes: Cervical, supraclavicular, and axillary nodes normal. Neurologic: Alert and oriented X 3, normal strength and tone. Normal symmetric reflexes. Normal coordination and gait    Assessment:    Healthy female exam.      Plan:     See After Visit Summary for Counseling Recommendations  Keep up a regular exercise program and make sure you are eating a healthy diet Try to eat 4 servings of dairy a day, or if you are lactose intolerant take a calcium with vitamin D daily.  Your vaccines are up to date.  4 years past due for mammo. She is a little unsuer but agreed to go.    Actinic keratoses-encouraged her to go ahead and see her dermatologist sooner rather than later and have these treated and she may even need to still keep her appointment in October for a routine skin check.

## 2012-09-06 ENCOUNTER — Other Ambulatory Visit: Payer: Self-pay | Admitting: *Deleted

## 2012-09-06 DIAGNOSIS — C9000 Multiple myeloma not having achieved remission: Secondary | ICD-10-CM

## 2012-09-06 MED ORDER — LENALIDOMIDE 25 MG PO CAPS: 25.0000 mg | ORAL_CAPSULE | Freq: Every day | ORAL | Status: DC

## 2012-09-15 ENCOUNTER — Ambulatory Visit (HOSPITAL_BASED_OUTPATIENT_CLINIC_OR_DEPARTMENT_OTHER): Payer: BC Managed Care – PPO | Admitting: Hematology & Oncology

## 2012-09-15 ENCOUNTER — Ambulatory Visit (HOSPITAL_BASED_OUTPATIENT_CLINIC_OR_DEPARTMENT_OTHER): Payer: BC Managed Care – PPO

## 2012-09-15 ENCOUNTER — Ambulatory Visit (HOSPITAL_BASED_OUTPATIENT_CLINIC_OR_DEPARTMENT_OTHER): Payer: BC Managed Care – PPO | Admitting: Lab

## 2012-09-15 VITALS — BP 131/72 | HR 80 | Temp 98.0°F | Resp 16 | Ht 69.0 in | Wt 182.0 lb

## 2012-09-15 DIAGNOSIS — C9000 Multiple myeloma not having achieved remission: Secondary | ICD-10-CM

## 2012-09-15 DIAGNOSIS — D72819 Decreased white blood cell count, unspecified: Secondary | ICD-10-CM

## 2012-09-15 DIAGNOSIS — Z5112 Encounter for antineoplastic immunotherapy: Secondary | ICD-10-CM

## 2012-09-15 LAB — CBC WITH DIFFERENTIAL (CANCER CENTER ONLY)
BASO%: 1.4 % (ref 0.0–2.0)
HCT: 33.1 % — ABNORMAL LOW (ref 34.8–46.6)
LYMPH#: 1.4 10*3/uL (ref 0.9–3.3)
MONO#: 0.3 10*3/uL (ref 0.1–0.9)
NEUT#: 0.3 10*3/uL — CL (ref 1.5–6.5)
NEUT%: 15.7 % — ABNORMAL LOW (ref 39.6–80.0)
Platelets: 349 10*3/uL (ref 145–400)
RDW: 14.2 % (ref 11.1–15.7)
WBC: 2.1 10*3/uL — ABNORMAL LOW (ref 3.9–10.0)

## 2012-09-15 MED ORDER — BORTEZOMIB CHEMO SQ INJECTION 3.5 MG (2.5MG/ML)
1.3000 mg/m2 | Freq: Once | INTRAMUSCULAR | Status: AC
  Administered 2012-09-15: 2.75 mg via SUBCUTANEOUS
  Filled 2012-09-15: qty 2.75

## 2012-09-15 MED ORDER — DEXAMETHASONE 4 MG PO TABS
20.0000 mg | ORAL_TABLET | Freq: Once | ORAL | Status: AC
  Administered 2012-09-15: 20 mg via ORAL

## 2012-09-15 MED ORDER — ONDANSETRON HCL 8 MG PO TABS
8.0000 mg | ORAL_TABLET | Freq: Once | ORAL | Status: AC
  Administered 2012-09-15: 8 mg via ORAL

## 2012-09-15 NOTE — Patient Instructions (Addendum)
[facility] Discharge Instructions for Patients Receiving Chemotherapy  Today you received the following chemotherapy agents: Velcade. To help prevent nausea and vomiting after your treatment, we encourage you to take your nausea medication.  If you develop nausea and vomiting that is not controlled by your nausea medication, call the clinic.   BELOW ARE SYMPTOMS THAT SHOULD BE REPORTED IMMEDIATELY:  *FEVER GREATER THAN 100.5 F  *CHILLS WITH OR WITHOUT FEVER  NAUSEA AND VOMITING THAT IS NOT CONTROLLED WITH YOUR NAUSEA MEDICATION  *UNUSUAL SHORTNESS OF BREATH  *UNUSUAL BRUISING OR BLEEDING  TENDERNESS IN MOUTH AND THROAT WITH OR WITHOUT PRESENCE OF ULCERS  *URINARY PROBLEMS  *BOWEL PROBLEMS  UNUSUAL RASH Items with * indicate a potential emergency and should be followed up as soon as possible.  Feel free to call the clinic you have any questions or concerns. The clinic phone number is [phone].    

## 2012-09-16 NOTE — Progress Notes (Signed)
This office note has been dictated.

## 2012-09-17 LAB — IFE INTERPRETATION

## 2012-09-17 LAB — PROTEIN ELECTROPHORESIS, SERUM, WITH REFLEX
Albumin ELP: 62.9 % (ref 55.8–66.1)
Alpha-1-Globulin: 4 % (ref 2.9–4.9)
Beta 2: 3.6 % (ref 3.2–6.5)
Beta Globulin: 5.4 % (ref 4.7–7.2)
Total Protein, Serum Electrophoresis: 6.6 g/dL (ref 6.0–8.3)

## 2012-09-17 LAB — COMPREHENSIVE METABOLIC PANEL
ALT: 20 U/L (ref 0–35)
Albumin: 4.4 g/dL (ref 3.5–5.2)
CO2: 24 mEq/L (ref 19–32)
Calcium: 9.1 mg/dL (ref 8.4–10.5)
Chloride: 104 mEq/L (ref 96–112)
Potassium: 4.2 mEq/L (ref 3.5–5.3)
Sodium: 138 mEq/L (ref 135–145)
Total Protein: 6.6 g/dL (ref 6.0–8.3)

## 2012-09-17 LAB — IGG, IGA, IGM: IgG (Immunoglobin G), Serum: 944 mg/dL (ref 690–1700)

## 2012-09-17 NOTE — Progress Notes (Signed)
CC:   Nancy Parks, M.D.  DIAGNOSES: 1. IgG lambda myeloma. 2. Leukopenia.  CURRENT THERAPY:  Revlimid/Velcade/Decadron (3 weeks on and 1 week off).  INTERIM HISTORY:  This patient comes in for followup.  She is doing fairly well.  She has actually tolerated treatment nicely.  She has had very little in the way of complications from the treatment to date.  She has had no fever.  There has been no bony issues.  She has had no change in bowel or bladder habits.  She has had no cough.  She does have some fatigue.  This may be related to her treatments. Also, it can be related to her leukopenia.  When we last checked her myeloma studies, her monoclonal spike was 0.79 g/dL.  IgG level was 1070 mg/dL.  Lambda light chain was 1.6 mg/dL.  PHYSICAL EXAMINATION:  General:  This is a well-developed, well- nourished, white female in no obvious distress.  Vital signs: Temperature of 98, pulse 80, respiratory rate 16, blood pressure 131/72, weight is 182.  Head and neck:  Normocephalic, atraumatic skull.  There are no ocular or oral lesions.  There are no palpable cervical or supraclavicular lymph nodes.  Lungs:  Clear bilaterally.  Cardiac: Regular rate and rhythm with a normal S1, S2.  There are no murmurs, rubs, or bruits.  Abdomen:  Soft.  She has good bowel sounds.  There is no fluid wave.  There is no palpable hepatosplenomegaly.  Back:  No tenderness over the spine, ribs, or hips.  Extremities:  Show no clubbing, cyanosis or edema.  Neurological:  Shows no focal neurological deficits.  LABORATORY STUDIES:  White cell count is 2.1, hemoglobin 11.1, hematocrit 33.1, platelet count 349.  IMPRESSION:  The patient is a very charming, 62 year old, white female with IgG lambda myeloma.  She has done well with treatment.  Her monoclonal spike has come down.  Hopefully, we will continue to see it come down.  I really believe that the leukopenia is a separate issue.  We did do a bone  marrow biopsy on her.  We do not see anything that was significant for the etiology of the leukopenia.  I would not think that it being a paraneoplastic defect from the myeloma, but I suppose this is a remote possibility.  For now, we will proceed with her next cycle of treatment.  At some point, we will probably need to think about maintenance therapy for her.  We might be able to just get away with Revlimid.  She has normal cytogenetics with her bone marrow that we did.  I will plan to see her back in a month.    ______________________________ Josph Macho, M.D. PRE/MEDQ  D:  09/16/2012  T:  09/17/2012  Job:  [id number]

## 2012-09-21 ENCOUNTER — Ambulatory Visit: Payer: Self-pay | Admitting: Hematology & Oncology

## 2012-09-21 LAB — COMPLETE METABOLIC PANEL WITH GFR
ALT: 18 U/L (ref 0–35)
AST: 14 U/L (ref 0–37)
CO2: 29 mEq/L (ref 19–32)
Calcium: 8.6 mg/dL (ref 8.4–10.5)
Chloride: 105 mEq/L (ref 96–112)
Creat: 0.71 mg/dL (ref 0.50–1.10)
Sodium: 139 mEq/L (ref 135–145)
Total Protein: 6 g/dL (ref 6.0–8.3)

## 2012-09-21 LAB — LIPID PANEL
HDL: 52 mg/dL (ref >39)
LDL Cholesterol: 102 mg/dL — ABNORMAL HIGH (ref 0–99)
Triglycerides: 148 mg/dL (ref <150)
VLDL: 30 mg/dL (ref 0–40)

## 2012-09-21 LAB — FERRITIN: Ferritin: 450 ng/mL — ABNORMAL HIGH (ref 10–291)

## 2012-09-21 LAB — VITAMIN B12: Vitamin B-12: 421 pg/mL (ref 211–911)

## 2012-09-22 ENCOUNTER — Ambulatory Visit (HOSPITAL_BASED_OUTPATIENT_CLINIC_OR_DEPARTMENT_OTHER)
Admission: RE | Admit: 2012-09-22 | Discharge: 2012-09-22 | Disposition: A | Discharge status: Home / Self Care | Payer: BC Managed Care – PPO | Source: Ambulatory Visit | Attending: Family Medicine | Admitting: Family Medicine

## 2012-09-22 ENCOUNTER — Ambulatory Visit (HOSPITAL_BASED_OUTPATIENT_CLINIC_OR_DEPARTMENT_OTHER): Payer: BC Managed Care – PPO

## 2012-09-22 DIAGNOSIS — Z5112 Encounter for antineoplastic immunotherapy: Secondary | ICD-10-CM

## 2012-09-22 DIAGNOSIS — C9000 Multiple myeloma not having achieved remission: Secondary | ICD-10-CM

## 2012-09-22 DIAGNOSIS — Z1231 Encounter for screening mammogram for malignant neoplasm of breast: Secondary | ICD-10-CM | POA: Insufficient documentation

## 2012-09-22 MED ORDER — ONDANSETRON HCL 8 MG PO TABS
8.0000 mg | ORAL_TABLET | Freq: Once | ORAL | Status: AC
  Administered 2012-09-22: 8 mg via ORAL

## 2012-09-22 MED ORDER — DEXAMETHASONE 4 MG PO TABS
20.0000 mg | ORAL_TABLET | Freq: Once | ORAL | Status: AC
  Administered 2012-09-22: 20 mg via ORAL

## 2012-09-22 MED ORDER — BORTEZOMIB CHEMO SQ INJECTION 3.5 MG (2.5MG/ML)
1.3000 mg/m2 | Freq: Once | INTRAMUSCULAR | Status: AC
  Administered 2012-09-22: 2.75 mg via SUBCUTANEOUS
  Filled 2012-09-22: qty 2.75

## 2012-09-22 NOTE — Patient Instructions (Addendum)
[facility] Discharge Instructions for Patients Receiving Chemotherapy  Today you received the following chemotherapy agents: Velcade. To help prevent nausea and vomiting after your treatment, we encourage you to take your nausea medication.  If you develop nausea and vomiting that is not controlled by your nausea medication, call the clinic.   BELOW ARE SYMPTOMS THAT SHOULD BE REPORTED IMMEDIATELY:  *FEVER GREATER THAN 100.5 F  *CHILLS WITH OR WITHOUT FEVER  NAUSEA AND VOMITING THAT IS NOT CONTROLLED WITH YOUR NAUSEA MEDICATION  *UNUSUAL SHORTNESS OF BREATH  *UNUSUAL BRUISING OR BLEEDING  TENDERNESS IN MOUTH AND THROAT WITH OR WITHOUT PRESENCE OF ULCERS  *URINARY PROBLEMS  *BOWEL PROBLEMS  UNUSUAL RASH Items with * indicate a potential emergency and should be followed up as soon as possible.  Feel free to call the clinic you have any questions or concerns. The clinic phone number is [phone].    

## 2012-09-23 ENCOUNTER — Other Ambulatory Visit: Payer: Self-pay | Admitting: Family Medicine

## 2012-09-23 DIAGNOSIS — R928 Other abnormal and inconclusive findings on diagnostic imaging of breast: Secondary | ICD-10-CM

## 2012-09-29 ENCOUNTER — Ambulatory Visit (HOSPITAL_BASED_OUTPATIENT_CLINIC_OR_DEPARTMENT_OTHER): Payer: BC Managed Care – PPO

## 2012-09-29 VITALS — BP 121/78 | HR 100 | Temp 99.0°F | Resp 18

## 2012-09-29 DIAGNOSIS — C9000 Multiple myeloma not having achieved remission: Secondary | ICD-10-CM

## 2012-09-29 DIAGNOSIS — Z5112 Encounter for antineoplastic immunotherapy: Secondary | ICD-10-CM

## 2012-09-29 MED ORDER — BORTEZOMIB CHEMO SQ INJECTION 3.5 MG (2.5MG/ML)
1.3000 mg/m2 | Freq: Once | INTRAMUSCULAR | Status: AC
  Administered 2012-09-29: 2.75 mg via SUBCUTANEOUS
  Filled 2012-09-29: qty 2.75

## 2012-09-29 MED ORDER — DEXAMETHASONE 4 MG PO TABS
20.0000 mg | ORAL_TABLET | Freq: Once | ORAL | Status: AC
  Administered 2012-09-29: 20 mg via ORAL

## 2012-09-29 MED ORDER — ONDANSETRON HCL 8 MG PO TABS
8.0000 mg | ORAL_TABLET | Freq: Once | ORAL | Status: AC
Start: 2012-09-29 — End: 2012-09-29
  Administered 2012-09-29: 8 mg via ORAL

## 2012-09-29 NOTE — Patient Instructions (Addendum)

## 2012-10-03 ENCOUNTER — Other Ambulatory Visit: Payer: Self-pay | Admitting: Family Medicine

## 2012-10-12 ENCOUNTER — Ambulatory Visit
Admission: RE | Admit: 2012-10-12 | Discharge: 2012-10-12 | Disposition: A | Discharge status: Home / Self Care | Payer: BC Managed Care – PPO | Source: Ambulatory Visit | Attending: Family Medicine | Admitting: Family Medicine

## 2012-10-12 DIAGNOSIS — R928 Other abnormal and inconclusive findings on diagnostic imaging of breast: Secondary | ICD-10-CM

## 2012-10-13 ENCOUNTER — Other Ambulatory Visit: Payer: Self-pay | Admitting: *Deleted

## 2012-10-13 ENCOUNTER — Ambulatory Visit (HOSPITAL_BASED_OUTPATIENT_CLINIC_OR_DEPARTMENT_OTHER): Payer: BC Managed Care – PPO | Admitting: Hematology & Oncology

## 2012-10-13 ENCOUNTER — Ambulatory Visit (HOSPITAL_BASED_OUTPATIENT_CLINIC_OR_DEPARTMENT_OTHER): Payer: BC Managed Care – PPO

## 2012-10-13 ENCOUNTER — Ambulatory Visit (HOSPITAL_BASED_OUTPATIENT_CLINIC_OR_DEPARTMENT_OTHER): Payer: BC Managed Care – PPO | Admitting: Lab

## 2012-10-13 VITALS — BP 126/71 | HR 96 | Temp 98.8°F | Resp 16 | Ht 69.0 in | Wt 187.0 lb

## 2012-10-13 DIAGNOSIS — C9000 Multiple myeloma not having achieved remission: Secondary | ICD-10-CM

## 2012-10-13 DIAGNOSIS — Z5112 Encounter for antineoplastic immunotherapy: Secondary | ICD-10-CM

## 2012-10-13 DIAGNOSIS — D72819 Decreased white blood cell count, unspecified: Secondary | ICD-10-CM

## 2012-10-13 LAB — CBC WITH DIFFERENTIAL (CANCER CENTER ONLY)
BASO%: 1.3 % (ref 0.0–2.0)
EOS%: 0.4 % (ref 0.0–7.0)
HCT: 31.2 % — ABNORMAL LOW (ref 34.8–46.6)
LYMPH#: 1.2 10*3/uL (ref 0.9–3.3)
MCHC: 33.7 g/dL (ref 32.0–36.0)
MONO#: 0.3 10*3/uL (ref 0.1–0.9)
NEUT%: 33.7 % — ABNORMAL LOW (ref 39.6–80.0)
Platelets: 364 10*3/uL (ref 145–400)
RDW: 14.6 % (ref 11.1–15.7)
WBC: 2.2 10*3/uL — ABNORMAL LOW (ref 3.9–10.0)

## 2012-10-13 LAB — CMP (CANCER CENTER ONLY)
Albumin: 4 g/dL (ref 3.3–5.5)
BUN, Bld: 9 mg/dL (ref 7–22)
Calcium: 9 mg/dL (ref 8.0–10.3)
Chloride: 100 mEq/L (ref 98–108)
Creat: 0.6 mg/dl (ref 0.6–1.2)
Glucose, Bld: 88 mg/dL (ref 73–118)
Potassium: 4.1 mEq/L (ref 3.3–4.7)

## 2012-10-13 MED ORDER — ONDANSETRON HCL 8 MG PO TABS
8.0000 mg | ORAL_TABLET | Freq: Once | ORAL | Status: AC
  Administered 2012-10-13: 8 mg via ORAL

## 2012-10-13 MED ORDER — BORTEZOMIB CHEMO SQ INJECTION 3.5 MG (2.5MG/ML)
1.3000 mg/m2 | Freq: Once | INTRAMUSCULAR | Status: AC
  Administered 2012-10-13: 2.75 mg via SUBCUTANEOUS
  Filled 2012-10-13: qty 2.75

## 2012-10-13 MED ORDER — DEXAMETHASONE 4 MG PO TABS
20.0000 mg | ORAL_TABLET | Freq: Once | ORAL | Status: AC
  Administered 2012-10-13: 20 mg via ORAL

## 2012-10-13 MED ORDER — LENALIDOMIDE 25 MG PO CAPS: 25.0000 mg | ORAL_CAPSULE | Freq: Every day | ORAL | Status: DC

## 2012-10-13 MED ORDER — LENALIDOMIDE 15 MG PO CAPS: ORAL_CAPSULE | ORAL | Status: DC

## 2012-10-13 NOTE — Patient Instructions (Signed)

## 2012-10-13 NOTE — Progress Notes (Signed)
This office note has been dictated.

## 2012-10-14 NOTE — Progress Notes (Signed)
CC:   [name], M.D.  DIAGNOSES: 1. IgG lambda myeloma. 2. Chronic leukopenia.  CURRENT THERAPY:  Velcade/Revlimid/Decadron (3 weeks on/1 week off).  INTERIM HISTORY:  Ms. Orson Slick comes in for followup.  She is responding to treatment.  When we last saw her, her monoclonal spike was down to 0.66 __________/dL.  As such, she is responding nicely.  She does have the leukopenia.  I believe this is a totally separate issue for [name].  She had some skin cancers removed recently.  She had one on her left upper arm that appeared to have some infection afterwards.  She has not been put on any kind of antibiotics, and the infection seemed to get better on its own.  Ms. Orson Slick is feeling a little bit tired.  She has had no fever.  She has had no nausea or vomiting.  She has had no change in bowel or bladder habits.  There are some occasional arthralgias.  PHYSICAL EXAMINATION:  General:  This is a well-developed, well- nourished white female in no obvious distress.  Vital signs: Temperature of 98.8, pulse 96, respiratory rate 16, blood pressure 126/71.  Weight is 187.  Head and neck:  Normocephalic, atraumatic skull.  There are no ocular or oral lesions.  There are no palpable cervical or supraclavicular lymph nodes.  Lungs:  Clear bilaterally. Cardiac:  Regular rate and rhythm with a normal S1 and S2.  There are no murmurs, rubs or bruits.  Abdomen:  Soft.  She has good bowel sounds. There is no fluid wave.  There is no palpable hepatosplenomegaly.  Back: No tenderness over the spine, ribs, or hips.  Extremities:  No clubbing, cyanosis or edema.  Neurologic:  No focal neurological deficits.  LABORATORY STUDIES:  White cell count is 2.2, hemoglobin 10.5, hematocrit 31.2, platelet count 364.  IMPRESSION:  Ms. Orson Slick is a nice 62 year old white female with IgG lambda myeloma.  She is responding to treatment.  I think that we might at a point where we can try to get her on  some kind of maintenance therapy.  I am not sure how much more we would gain by giving her full-dose chemotherapy.  We will also need to set her up with a bone marrow biopsy.  I probably would consider doing this in October.  We will go ahead and complete this cycle of chemo.  She will get Velcade this week and the next 2 weeks.  Afterwards, I think we can just get her on Revlimid.  She has normal cytogenetics, so I do not think we need to put her on double maintenance therapy.  Again, I will plan to see her back in about 6 weeks and then we will see about setting her up with her bone marrow test.    ______________________________ Josph Macho, M.D. PRE/MEDQ  D:  10/13/2012  T:  10/14/2012  Job:  [id number]

## 2012-10-15 ENCOUNTER — Telehealth: Payer: Self-pay | Admitting: *Deleted

## 2012-10-15 LAB — PROTEIN ELECTROPHORESIS, SERUM, WITH REFLEX
Albumin ELP: 61.5 % (ref 55.8–66.1)
Alpha-2-Globulin: 11.1 % (ref 7.1–11.8)
Beta 2: 3.6 % (ref 3.2–6.5)
Beta Globulin: 5.6 % (ref 4.7–7.2)
M-Spike, %: 0.57 g/dL
Total Protein, Serum Electrophoresis: 6.5 g/dL (ref 6.0–8.3)

## 2012-10-15 LAB — KAPPA/LAMBDA LIGHT CHAINS: Kappa free light chain: 0.21 mg/dL — ABNORMAL LOW (ref 0.33–1.94)

## 2012-10-15 LAB — IGG, IGA, IGM
IgG (Immunoglobin G), Serum: 884 mg/dL (ref 690–1700)
IgM, Serum: 45 mg/dL — ABNORMAL LOW (ref 52–322)

## 2012-10-15 NOTE — Telephone Encounter (Signed)
Patient states she called her oncologist and they believe it to be due to her use of Pepto.

## 2012-10-15 NOTE — Telephone Encounter (Signed)
Does she feel like she's having any reflux or heartburn. She could be regurgitating a little bit at night. If she has any type of cough or shortness of breath and she definitely needs to be seen.

## 2012-10-15 NOTE — Telephone Encounter (Signed)
Pt states that when she got up yesterday am and today am she has had a dark, chocolate color mucus on her tongue. She wants to know what you think this may be or if you think it is coming from her chemo. Please advise.  Meyer Cory, LPN

## 2012-10-19 ENCOUNTER — Telehealth: Payer: Self-pay | Admitting: *Deleted

## 2012-10-19 NOTE — Telephone Encounter (Addendum)
Message copied by Mirian Capuchin on Tue Oct 19, 2012  2:53 PM ------      Message from: Arlan Organ R      Created: Sun Oct 17, 2012  8:30 PM       Call - myeloma protein now 0.57.  You are doing great!!  Pete ------This message given to pt.  Voiced understanding.

## 2012-10-20 ENCOUNTER — Ambulatory Visit (HOSPITAL_BASED_OUTPATIENT_CLINIC_OR_DEPARTMENT_OTHER): Payer: BC Managed Care – PPO

## 2012-10-20 VITALS — BP 132/81 | HR 101 | Temp 98.6°F

## 2012-10-20 DIAGNOSIS — C9000 Multiple myeloma not having achieved remission: Secondary | ICD-10-CM

## 2012-10-20 DIAGNOSIS — Z5112 Encounter for antineoplastic immunotherapy: Secondary | ICD-10-CM

## 2012-10-20 MED ORDER — DEXAMETHASONE 4 MG PO TABS
20.0000 mg | ORAL_TABLET | Freq: Once | ORAL | Status: AC
  Administered 2012-10-20: 20 mg via ORAL

## 2012-10-20 MED ORDER — ONDANSETRON HCL 8 MG PO TABS
8.0000 mg | ORAL_TABLET | Freq: Once | ORAL | Status: AC
  Administered 2012-10-20: 8 mg via ORAL

## 2012-10-20 MED ORDER — BORTEZOMIB CHEMO SQ INJECTION 3.5 MG (2.5MG/ML)
1.3000 mg/m2 | Freq: Once | INTRAMUSCULAR | Status: AC
  Administered 2012-10-20: 2.75 mg via SUBCUTANEOUS
  Filled 2012-10-20: qty 2.75

## 2012-10-20 NOTE — Patient Instructions (Addendum)

## 2012-10-27 ENCOUNTER — Ambulatory Visit (HOSPITAL_BASED_OUTPATIENT_CLINIC_OR_DEPARTMENT_OTHER): Payer: BC Managed Care – PPO

## 2012-10-27 ENCOUNTER — Ambulatory Visit (HOSPITAL_BASED_OUTPATIENT_CLINIC_OR_DEPARTMENT_OTHER): Payer: BC Managed Care – PPO | Admitting: Hematology & Oncology

## 2012-10-27 ENCOUNTER — Other Ambulatory Visit (HOSPITAL_BASED_OUTPATIENT_CLINIC_OR_DEPARTMENT_OTHER): Payer: BC Managed Care – PPO | Admitting: Lab

## 2012-10-27 VITALS — BP 121/78 | HR 91 | Temp 98.4°F | Resp 16 | Ht 69.0 in | Wt 184.0 lb

## 2012-10-27 DIAGNOSIS — C9000 Multiple myeloma not having achieved remission: Secondary | ICD-10-CM

## 2012-10-27 DIAGNOSIS — C9001 Multiple myeloma in remission: Secondary | ICD-10-CM

## 2012-10-27 DIAGNOSIS — D72819 Decreased white blood cell count, unspecified: Secondary | ICD-10-CM

## 2012-10-27 DIAGNOSIS — Z5112 Encounter for antineoplastic immunotherapy: Secondary | ICD-10-CM

## 2012-10-27 LAB — CBC WITH DIFFERENTIAL (CANCER CENTER ONLY)
Eosinophils Absolute: 0 10*3/uL (ref 0.0–0.5)
HGB: 11.2 g/dL — ABNORMAL LOW (ref 11.6–15.9)
LYMPH#: 1.1 10*3/uL (ref 0.9–3.3)
MONO#: 0.2 10*3/uL (ref 0.1–0.9)
NEUT#: 0.8 10*3/uL — ABNORMAL LOW (ref 1.5–6.5)
Platelets: 228 10*3/uL (ref 145–400)
RBC: 3.28 10*6/uL — ABNORMAL LOW (ref 3.70–5.32)
WBC: 2.2 10*3/uL — ABNORMAL LOW (ref 3.9–10.0)

## 2012-10-27 LAB — CHCC SATELLITE - SMEAR

## 2012-10-27 MED ORDER — ONDANSETRON HCL 8 MG PO TABS
8.0000 mg | ORAL_TABLET | Freq: Once | ORAL | Status: AC
  Administered 2012-10-27: 8 mg via ORAL

## 2012-10-27 MED ORDER — BORTEZOMIB CHEMO SQ INJECTION 3.5 MG (2.5MG/ML)
1.3000 mg/m2 | Freq: Once | INTRAMUSCULAR | Status: AC
  Administered 2012-10-27: 2.75 mg via SUBCUTANEOUS
  Filled 2012-10-27: qty 2.75

## 2012-10-27 MED ORDER — DEXAMETHASONE 4 MG PO TABS
20.0000 mg | ORAL_TABLET | Freq: Once | ORAL | Status: AC
  Administered 2012-10-27: 20 mg via ORAL

## 2012-10-27 NOTE — Patient Instructions (Signed)

## 2012-10-27 NOTE — Progress Notes (Signed)
This office note has been dictated.

## 2012-10-28 NOTE — Progress Notes (Signed)
CC:   Nancy Parks, M.D.  DIAGNOSES: 1. IgG lambda myeloma. 2. 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  CURRENT THERAPY: 1. The patient to complete her Velcade today. 2. Revlimid 15 mg p.o. daily (21 days on and 7 days off).  INTERIM HISTORY:  [name] comes in for followup.  She has done quite well.  She does still feel a little tired.  So far, there has been no problem with infections.  Her monoclonal spike now is down to 0.57 g/dL. Her IgG level is 884 mg/dL.  Lambda light chain was 0.79 mg/dL.  I think that we can probably stop the Velcade at this point in time.  I am not sure how much more we are going to gain by continuing her on therapy.  She continues on the Revlimid.  She is doing well with Revlimid.  There is no nausea or vomiting.  She has had no problems with bowels or bladder.  There is no bony pain.  When we did her initial bone scan, she may have had a lesion in her, I think, skull but nothing else that was noted.  She has had no rashes.  There has been no leg swelling.  There has been no cough.  There have been no mouth sores.  PHYSICAL EXAMINATION:  General:  This is a well-developed, well- nourished white female in no obvious distress.  Vital signs:  Show temperature of 98.4, pulse 91, respiratory rate 16, blood pressure 121/78.  Weight is 184 pounds.  Head and neck:  Normocephalic, atraumatic skull.  There are no ocular or oral lesions.  There are no palpable cervical or supraclavicular lymph nodes.  Lungs:  Clear bilaterally.  Cardiac:  Regular rate and rhythm with a normal S1 and S2. There are no murmurs, rubs, or bruits.  Abdomen:  Soft.  She has good bowel sounds.  There is no fluid wave.  There is no palpable hepatosplenomegaly.  Extremities:  Shows no clubbing, cyanosis, or edema.  She has good range motion of her joints.  Skin:  No rashes, ecchymosis, or petechia.  LABORATORIES STUDIES:  White cell count 2.2, hemoglobin 11.2,  hematocrit 33.5, platelet count 228.  BUN is 8, creatinine 0.8.  Calcium 8.6 with an albumin of 4.0.  Total protein 6.1.  IMPRESSION:  [name] is a very charming 62 year old white female with IgG lambda myeloma.  Again, she has responded very, very nicely.  Her monoclonal spike continues to improve.  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  I will do this on October 14th.  Again, we will keep her on the Revlimid for right now.  I think if we continue to see a response with the monoclonal spike, we might be able to decrease her Revlimid dose even further.  I think that her leukopenia is a separate issue.  I think she just may have this chronic benign leukopenia.  The thought is that if this was myeloma related, we would see an improvement, but we really have not. She is __________ with the leukopenia.  We will go ahead and do the bone marrow biopsy on October 14th.  I will see her back 2 weeks later.    ______________________________ Josph Macho, M.D. PRE/MEDQ  D:  10/27/2012  T:  10/28/2012  Job:  [id number]

## 2012-10-29 LAB — IGG, IGA, IGM: IgG (Immunoglobin G), Serum: 880 mg/dL (ref 690–1700)

## 2012-10-29 LAB — PROTEIN ELECTROPHORESIS, SERUM
Albumin ELP: 65.1 % (ref 55.8–66.1)
Beta 2: 3.1 % — ABNORMAL LOW (ref 3.2–6.5)
Beta Globulin: 5.2 % (ref 4.7–7.2)

## 2012-10-29 LAB — COMPREHENSIVE METABOLIC PANEL
ALT: 15 U/L (ref 0–35)
Albumin: 4 g/dL (ref 3.5–5.2)
CO2: 26 mEq/L (ref 19–32)
Calcium: 8.6 mg/dL (ref 8.4–10.5)
Chloride: 105 mEq/L (ref 96–112)
Potassium: 3.9 mEq/L (ref 3.5–5.3)
Sodium: 137 mEq/L (ref 135–145)
Total Bilirubin: 0.4 mg/dL (ref 0.3–1.2)
Total Protein: 6.1 g/dL (ref 6.0–8.3)

## 2012-10-29 LAB — KAPPA/LAMBDA LIGHT CHAINS: Kappa:Lambda Ratio: 0.52 (ref 0.26–1.65)

## 2012-11-01 ENCOUNTER — Telehealth: Payer: Self-pay

## 2012-11-01 NOTE — Telephone Encounter (Addendum)
Message copied by Leana Gamer on Mon Nov 01, 2012  3:39 PM ------      Message from: Josph Macho      Created: Sun Oct 31, 2012  7:59 PM       Call - myeloma protein still lower!!  [place] job!!  Cindee Lame ------left message on patient cell phone regarding her myeloma protein still lower.

## 2012-11-11 ENCOUNTER — Other Ambulatory Visit: Payer: Self-pay | Admitting: *Deleted

## 2012-11-11 DIAGNOSIS — C9000 Multiple myeloma not having achieved remission: Secondary | ICD-10-CM

## 2012-11-12 MED ORDER — LENALIDOMIDE 15 MG PO CAPS: ORAL_CAPSULE | ORAL | Status: DC

## 2012-11-12 NOTE — Telephone Encounter (Signed)
Received a refill request from [facility] for Revlimid 15 mg capsules 1 po daily 21 days on 7 days off. Obtained auth # [id number] from [employer]. Will fax back to [phone] after signed by Dr Myna Hidalgo.

## 2012-11-18 ENCOUNTER — Ambulatory Visit: Payer: Self-pay | Admitting: Hematology & Oncology

## 2012-11-18 ENCOUNTER — Other Ambulatory Visit: Payer: Self-pay | Admitting: Lab

## 2012-11-26 ENCOUNTER — Encounter (HOSPITAL_COMMUNITY): Payer: Self-pay | Admitting: Pharmacy Technician

## 2012-11-30 ENCOUNTER — Ambulatory Visit (HOSPITAL_BASED_OUTPATIENT_CLINIC_OR_DEPARTMENT_OTHER): Payer: BC Managed Care – PPO | Admitting: Hematology & Oncology

## 2012-11-30 ENCOUNTER — Encounter (HOSPITAL_COMMUNITY): Payer: Self-pay

## 2012-11-30 ENCOUNTER — Other Ambulatory Visit (HOSPITAL_COMMUNITY): Payer: Self-pay | Admitting: Hematology & Oncology

## 2012-11-30 ENCOUNTER — Ambulatory Visit (HOSPITAL_COMMUNITY)
Admission: RE | Admit: 2012-11-30 | Discharge: 2012-11-30 | Disposition: A | Discharge status: Home / Self Care | Payer: BC Managed Care – PPO | Source: Ambulatory Visit | Attending: Hematology & Oncology | Admitting: Hematology & Oncology

## 2012-11-30 VITALS — BP 103/64 | HR 65 | Temp 97.0°F | Resp 16 | Ht 70.0 in | Wt 182.0 lb

## 2012-11-30 DIAGNOSIS — D72822 Plasmacytosis: Secondary | ICD-10-CM | POA: Insufficient documentation

## 2012-11-30 DIAGNOSIS — D649 Anemia, unspecified: Secondary | ICD-10-CM | POA: Insufficient documentation

## 2012-11-30 DIAGNOSIS — C9001 Multiple myeloma in remission: Secondary | ICD-10-CM | POA: Insufficient documentation

## 2012-11-30 DIAGNOSIS — D72819 Decreased white blood cell count, unspecified: Secondary | ICD-10-CM | POA: Insufficient documentation

## 2012-11-30 LAB — CBC WITH DIFFERENTIAL/PLATELET
Basophils Relative: 1 % (ref 0–1)
Eosinophils Absolute: 0 10*3/uL (ref 0.0–0.7)
HCT: 32.4 % — ABNORMAL LOW (ref 36.0–46.0)
Hemoglobin: 11.2 g/dL — ABNORMAL LOW (ref 12.0–15.0)
Lymphocytes Relative: 60 % — ABNORMAL HIGH (ref 12–46)
MCH: 33.1 pg (ref 26.0–34.0)
MCHC: 34.6 g/dL (ref 30.0–36.0)
Neutro Abs: 0.4 10*3/uL — ABNORMAL LOW (ref 1.7–7.7)
Neutrophils Relative %: 23 % — ABNORMAL LOW (ref 43–77)

## 2012-11-30 LAB — BONE MARROW EXAM: Bone Marrow Exam: 726

## 2012-11-30 MED ORDER — SODIUM CHLORIDE 0.9 % IV SOLN
Freq: Once | INTRAVENOUS | Status: AC
  Administered 2012-11-30: 20 mL/h via INTRAVENOUS

## 2012-11-30 MED ORDER — MEPERIDINE HCL 50 MG/ML IJ SOLN
50.0000 mg | Freq: Once | INTRAMUSCULAR | Status: AC
  Administered 2012-11-30: 25 mg via INTRAVENOUS
  Filled 2012-11-30: qty 1

## 2012-11-30 MED ORDER — MIDAZOLAM HCL 10 MG/2ML IJ SOLN
10.0000 mg | Freq: Once | INTRAMUSCULAR | Status: AC
  Administered 2012-11-30: 5 mg via INTRAVENOUS
  Filled 2012-11-30: qty 2

## 2012-11-30 NOTE — Progress Notes (Signed)
This office note has been dictated.

## 2012-12-01 NOTE — Procedures (Signed)
Ms. Orson Slick was brought to the Short Stay Unit at [facility]. She had an IV placed without any difficulty.  Her Mallampati score was 1.  ASA class was 1.  We did the appropriate time-out procedure on her.  She has then placed onto her right side.  She received a total of 5 mg of Versed and 25 mg of Demerol for IV sedation.  The left posterior iliac crest region was prepped and draped in sterile fashion.  Eight mL of 2% lidocaine was infiltrated under the skin down to the periosteum.  I did have to use a spinal needle at 22-gauge to reach the periosteum.  We then used a #11 scalpel to make an incision into the skin.  Two bone marrow aspirates were obtained without difficulty.  One aspirate was sent for cytology and the other was sent for flow cytometry and cytogenetics.  We then used a Jamshidi biopsy needle.  We got an excellent bone marrow biopsy core.  We dressed and clean the procedure site sterilely.  Ms. Orson Slick tolerated the procedure well.  There were no complications.    ______________________________ Josph Macho, M.D. PRE/MEDQ  D:  11/30/2012  T:  12/01/2012  Job:  [id number]

## 2012-12-03 ENCOUNTER — Ambulatory Visit: Payer: Self-pay | Admitting: Hematology & Oncology

## 2012-12-09 ENCOUNTER — Encounter: Payer: Self-pay | Admitting: Nurse Practitioner

## 2012-12-09 NOTE — Progress Notes (Signed)
RX for Revlimid 15mg  faxed to [facility] [phone]). Signed via Dr. Arbutus Ped.

## 2012-12-14 ENCOUNTER — Telehealth: Payer: Self-pay | Admitting: Hematology & Oncology

## 2012-12-14 ENCOUNTER — Ambulatory Visit (HOSPITAL_BASED_OUTPATIENT_CLINIC_OR_DEPARTMENT_OTHER): Payer: BC Managed Care – PPO | Admitting: Hematology & Oncology

## 2012-12-14 ENCOUNTER — Ambulatory Visit (HOSPITAL_BASED_OUTPATIENT_CLINIC_OR_DEPARTMENT_OTHER): Payer: BC Managed Care – PPO | Admitting: Lab

## 2012-12-14 VITALS — BP 122/75 | HR 90 | Temp 98.5°F | Resp 14 | Ht 70.0 in | Wt 183.0 lb

## 2012-12-14 DIAGNOSIS — D72819 Decreased white blood cell count, unspecified: Secondary | ICD-10-CM

## 2012-12-14 DIAGNOSIS — C9000 Multiple myeloma not having achieved remission: Secondary | ICD-10-CM

## 2012-12-14 DIAGNOSIS — C9001 Multiple myeloma in remission: Secondary | ICD-10-CM

## 2012-12-14 LAB — CHCC SATELLITE - SMEAR

## 2012-12-14 LAB — CBC WITH DIFFERENTIAL (CANCER CENTER ONLY)
BASO#: 0.1 10*3/uL (ref 0.0–0.2)
Eosinophils Absolute: 0 10*3/uL (ref 0.0–0.5)
HGB: 11.7 g/dL (ref 11.6–15.9)
LYMPH#: 1.3 10*3/uL (ref 0.9–3.3)
LYMPH%: 55.6 % — ABNORMAL HIGH (ref 14.0–48.0)
MCH: 32.7 pg (ref 26.0–34.0)
MONO#: 0.2 10*3/uL (ref 0.1–0.9)
NEUT#: 0.7 10*3/uL — ABNORMAL LOW (ref 1.5–6.5)
RBC: 3.58 10*6/uL — ABNORMAL LOW (ref 3.70–5.32)
RDW: 14.3 % (ref 11.1–15.7)
WBC: 2.3 10*3/uL — ABNORMAL LOW (ref 3.9–10.0)

## 2012-12-14 NOTE — Telephone Encounter (Signed)
Mailed January schedule °

## 2012-12-14 NOTE — Progress Notes (Signed)
This office note has been dictated.

## 2012-12-15 NOTE — Progress Notes (Signed)
CC:   Nancy Parks, M.D.  DIAGNOSES: 1. 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 2. 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  CURRENT THERAPY:  Revlimid 15 mg p.o. q. day (21 days on/7 days off).  INTERIM HISTORY:  Nancy Parks comes in for her followup.  We did go ahead and do a repeat bone marrow biopsy on her.  This was done on October 14th.  The bone marrow report ([id number]) showed 15% plasma cells. There was a trilineage hematopoiesis.  There was good maturation of all 3 cell lines.  She had adequate granulocytic precursors.  With the bone marrow biopsy, there were no sheets of plasma cells.  She had no atypical-appearing plasma cells.  On her last monoclonal studies, her monoclonal spike was 0.45 g/dL.  IgG level was 880 mg/dL.  Lambda light chain was 1.4 mg/dL.  Her initial bone survey was done back in May.  Everything looked fine, with a questionable focus in the calvarium.  She feels well.  She had a little bit of bronchitis after we last saw her.  This resolved on its own without antibiotics.  She has had no rashes.  She has had no nausea or vomiting.  There has been no change in bowel or bladder habits.  She has had no headache.  PHYSICAL EXAMINATION:  General:  This is a well-developed, well- nourished white female in no obvious distress.  Vital signs: Temperature of 98.5, pulse 90, respiratory rate 14, blood pressure 122/75.  Weight is 183 pounds.  Head and Neck:  Show a normocephalic atraumatic skull.  There are no ocular or oral lesions.  There are no palpable cervical or supraclavicular lymph nodes.  Lungs:  Clear bilaterally.  Cardiac:  Regular rate and rhythm, with a normal S1 and S2.  There are no murmurs, rubs, or bruits.  Abdomen:  Soft.  She has good bowel sounds.  There is no fluid wave.  There is no palpable abdominal mass.  There is no palpable hepatosplenomegaly.  Back:  No tenderness over the spine, ribs, or hips.  Extremities:  Show  no clubbing, cyanosis, or edema.  Neurological:  No focal neurological deficit.  LABORATORY STUDIES:  White cell count is 2.3, hemoglobin 11.7, hematocrit 35.4, platelet count 295.  Absolute neutrophil count is 700.  On her blood smear there is no rouleaux formation.  She did have a decrease in neutrophils.  There was a slight increase in lymphocytes. The neutrophils and lymphocytes all looked normal in morphology.  There were no hypersegmented polys.  There were no atypical lymphocytes. Platelets were adequate in number and size.  IMPRESSION:  Nancy Parks is a very charming 62 year old white female with IgG lambda myeloma.  I suppose that she may even be considered as smoldering myeloma.  He had her on Velcade and Revlimid.  She had a very nice response.  Her white cell count did not increase so, again, I think this is an autoimmune effect.  I think we can probably get her on maintenance Revlimid.  Again, we will keep her on 15 mg daily for 21 days out of the 28 days.  This is very reasonable.  I think we can probably get her back now after the holidays.  I do not see need for any further x-ray studies or lab studies until we see her back.    ______________________________ Nancy Parks, M.D. PRE/MEDQ  D:  12/13/2012  T:  12/15/2012  Job:  [id number]

## 2012-12-16 LAB — PROTEIN ELECTROPHORESIS, SERUM, WITH REFLEX
Alpha-2-Globulin: 9.7 % (ref 7.1–11.8)
Beta 2: 3.4 % (ref 3.2–6.5)
Beta Globulin: 5.9 % (ref 4.7–7.2)
Gamma Globulin: 14.1 % (ref 11.1–18.8)
M-Spike, %: 0.5 g/dL

## 2012-12-16 LAB — KAPPA/LAMBDA LIGHT CHAINS
Kappa free light chain: 1.16 mg/dL (ref 0.33–1.94)
Kappa:Lambda Ratio: 0.92 (ref 0.26–1.65)
Lambda Free Lght Chn: 1.26 mg/dL (ref 0.57–2.63)

## 2012-12-16 LAB — IGG, IGA, IGM
IgA: 43 mg/dL — ABNORMAL LOW (ref 69–380)
IgM, Serum: 55 mg/dL (ref 52–322)

## 2012-12-16 LAB — CHROMOSOME ANALYSIS, BONE MARROW

## 2012-12-16 LAB — TISSUE HYBRIDIZATION (BONE MARROW)-NCBH

## 2012-12-17 ENCOUNTER — Telehealth: Payer: Self-pay | Admitting: *Deleted

## 2012-12-17 NOTE — Telephone Encounter (Addendum)
Message copied by Mirian Capuchin on Fri Dec 17, 2012 10:10 AM ------      Message from: Arlan Organ R      Created: Wed Dec 15, 2012  6:22 PM       Call - myeloma labs still pretty low and stable!!  Pete ------This message given to pt.  Voiced understanding.

## 2012-12-23 ENCOUNTER — Other Ambulatory Visit: Payer: Self-pay

## 2013-01-04 ENCOUNTER — Other Ambulatory Visit: Payer: Self-pay | Admitting: *Deleted

## 2013-01-04 DIAGNOSIS — C9000 Multiple myeloma not having achieved remission: Secondary | ICD-10-CM

## 2013-01-04 MED ORDER — LENALIDOMIDE 15 MG PO CAPS: 15.0000 mg | ORAL_CAPSULE | Freq: Every day | ORAL | Status: DC

## 2013-01-04 NOTE — Telephone Encounter (Signed)
Received a refill request from [facility] for Revlimid 15 mg capsules 1 po daily 21 days on 7 days off. Obtained auth # [id number] from [facility]. Will fax back to [phone] after signed by Dr Myna Hidalgo.

## 2013-01-16 ENCOUNTER — Telehealth: Payer: Self-pay | Admitting: Internal Medicine

## 2013-01-16 NOTE — Telephone Encounter (Signed)
Pt performing  yard work several days agoand had a little friend 62 year-old with "running nose".  Now she is having cold-like symptoms including mild non-productive cough, running nose.  She is increasing her hydration.  She noted her temp was 99 and she denied pleurisy or congestive symptoms.  She was calling to inform us given her low WBC.   PLAN: Continue supportive therapy including hydration, throat lozenges and/or mucinex.  Report to ER if temperature over 100.5 or greater.  Call us back if any concerns/questions.

## 2013-01-30 ENCOUNTER — Other Ambulatory Visit: Payer: Self-pay | Admitting: Family Medicine

## 2013-02-01 ENCOUNTER — Other Ambulatory Visit: Payer: Self-pay | Admitting: Nurse Practitioner

## 2013-02-01 DIAGNOSIS — C9000 Multiple myeloma not having achieved remission: Secondary | ICD-10-CM

## 2013-02-01 MED ORDER — LENALIDOMIDE 15 MG PO CAPS: 15.0000 mg | ORAL_CAPSULE | Freq: Every day | ORAL | Status: DC

## 2013-02-08 ENCOUNTER — Other Ambulatory Visit: Payer: Self-pay | Admitting: Hematology & Oncology

## 2013-02-22 ENCOUNTER — Ambulatory Visit: Payer: Self-pay | Admitting: Family Medicine

## 2013-02-23 ENCOUNTER — Other Ambulatory Visit: Payer: Self-pay | Admitting: *Deleted

## 2013-02-23 DIAGNOSIS — C9000 Multiple myeloma not having achieved remission: Secondary | ICD-10-CM

## 2013-02-23 MED ORDER — LENALIDOMIDE 15 MG PO CAPS: 15.0000 mg | ORAL_CAPSULE | Freq: Every day | ORAL | Status: DC

## 2013-02-24 ENCOUNTER — Ambulatory Visit (HOSPITAL_BASED_OUTPATIENT_CLINIC_OR_DEPARTMENT_OTHER): Payer: BC Managed Care – PPO | Admitting: Hematology & Oncology

## 2013-02-24 ENCOUNTER — Ambulatory Visit (HOSPITAL_BASED_OUTPATIENT_CLINIC_OR_DEPARTMENT_OTHER): Payer: BC Managed Care – PPO | Admitting: Lab

## 2013-02-24 VITALS — BP 130/73 | HR 73 | Temp 97.9°F | Resp 14 | Ht 70.0 in | Wt 188.0 lb

## 2013-02-24 DIAGNOSIS — C9000 Multiple myeloma not having achieved remission: Secondary | ICD-10-CM

## 2013-02-24 DIAGNOSIS — D72819 Decreased white blood cell count, unspecified: Secondary | ICD-10-CM

## 2013-02-24 LAB — CBC WITH DIFFERENTIAL (CANCER CENTER ONLY)
BASO#: 0 10*3/uL (ref 0.0–0.2)
BASO%: 0.8 % (ref 0.0–2.0)
EOS ABS: 0.1 10*3/uL (ref 0.0–0.5)
EOS%: 2.1 % (ref 0.0–7.0)
HEMATOCRIT: 36.6 % (ref 34.8–46.6)
HGB: 11.8 g/dL (ref 11.6–15.9)
LYMPH#: 1.3 10*3/uL (ref 0.9–3.3)
LYMPH%: 53.8 % — ABNORMAL HIGH (ref 14.0–48.0)
MCH: 31.5 pg (ref 26.0–34.0)
MCHC: 32.2 g/dL (ref 32.0–36.0)
MCV: 98 fL (ref 81–101)
MONO#: 0.3 10*3/uL (ref 0.1–0.9)
MONO%: 11.7 % (ref 0.0–13.0)
NEUT#: 0.8 10*3/uL — ABNORMAL LOW (ref 1.5–6.5)
NEUT%: 31.6 % — AB (ref 39.6–80.0)
Platelets: 219 10*3/uL (ref 145–400)
RBC: 3.75 10*6/uL (ref 3.70–5.32)
RDW: 14.5 % (ref 11.1–15.7)
WBC: 2.4 10*3/uL — AB (ref 3.9–10.0)

## 2013-02-24 LAB — CHCC SATELLITE - SMEAR

## 2013-02-24 NOTE — Progress Notes (Signed)
This office note has been dictated.

## 2013-02-25 NOTE — Progress Notes (Signed)
CC:   Nancy Parks, M.D.  DIAGNOSES: 1. IgG lambda myeloma/smoldering myeloma. 2. Chronic leukopenia.  CURRENT THERAPY: 1. Maintenance Revlimid 15 mg p.o. daily (21/7). 2. Aspirin 162 mg p.o. daily.  INTERIM HISTORY:  [name] comes in for followup.  We last her saw back in October.  She is doing pretty well.  She has really not had any issues with the Revlimid to date.  When we last checked her monoclonal spike in October, it was stable at 0.5 g/dL.  IgG level was 953 mg/dL.  Lambda light chain was 1.26 mg/dL.  She has had no problems with the Revlimid.  There has been no nausea or vomiting.  She has had no fevers, sweats, or chills.  She has had no change in bowel or bladder habits.  There has been no leg swelling.  She has had no arthralgias or myalgias.  Overall, her performance status is ECOG 0.  PHYSICAL EXAMINATION:  General:  This is a well-developed, well- nourished white female in no obvious distress.  Vital Signs: Temperature of 97.9, pulse 73, respiratory rate 14, blood pressure 130/73, and weight is 188 pounds.  Head and Neck:  Normocephalic, atraumatic skull.  There are no ocular or oral lesions.  There are no palpable cervical or supraclavicular lymph nodes.  Lungs:  Clear to percussion and auscultation bilaterally.  Cardiac:  Regular rate and rhythm with a normal S1, S2.  There are no murmurs, rubs, or bruits. Abdomen:  Soft.  She has good bowel sounds.  There is no fluid wave. There is no palpable abdominal mass.  No palpable hepatosplenomegaly. Axillary:  No bilateral axillary adenopathy.  Extremities:  No clubbing, cyanosis, or edema.  Neurological:  No focal neurological deficits. Skin:  No rashes, ecchymoses, or petechiae.  LABORATORY STUDIES:  White cell count is 2.4, hemoglobin 11.8, hematocrit 36.6, platelet count 219.  White cell differential shows 32 segs, 54 lymphs, and 12 monos.  Analysis of blood smear.  She has good maturation of her  white blood cells.  Lymphocytes appear mature.  She has no nucleated red blood cells.  I see no rouleaux formation.  She has no schistocytes or spherocytes.  Platelets are adequate in number and size.  IMPRESSION:  [name] is a very charming 63 year old white female with IgG lambda myeloma.  She was treated with Revlimid and Velcade.  She did very well with this.  We now have her on maintenance Revlimid.  She is asymptomatic right now.  When we first saw her, she had a questionable lytic lesion in the posterior calvarium.  For now, we will go ahead and plan to get her back in another three months.  Hopefully, if her myeloma numbers look stable, we might be able to get it down to a dose of 10 mg daily.    ______________________________ [name], M.D. PRE/MEDQ  D:  02/24/2013  T:  02/25/2013  Job:  [id number]

## 2013-02-28 LAB — PROTEIN ELECTROPHORESIS, SERUM, WITH REFLEX
Albumin ELP: 61.7 % (ref 55.8–66.1)
Alpha-1-Globulin: 4.1 % (ref 2.9–4.9)
Alpha-2-Globulin: 9.1 % (ref 7.1–11.8)
BETA 2: 3.7 % (ref 3.2–6.5)
Beta Globulin: 5.5 % (ref 4.7–7.2)
Gamma Globulin: 15.9 % (ref 11.1–18.8)
M-SPIKE, %: 0.68 g/dL
TOTAL PROTEIN, SERUM ELECTROPHOR: 6.9 g/dL (ref 6.0–8.3)

## 2013-02-28 LAB — IGG, IGA, IGM
IGM, SERUM: 62 mg/dL (ref 52–322)
IgA: 59 mg/dL — ABNORMAL LOW (ref 69–380)
IgG (Immunoglobin G), Serum: 1190 mg/dL (ref 690–1700)

## 2013-02-28 LAB — KAPPA/LAMBDA LIGHT CHAINS
KAPPA FREE LGHT CHN: 1.27 mg/dL (ref 0.33–1.94)
Kappa:Lambda Ratio: 0.98 (ref 0.26–1.65)
Lambda Free Lght Chn: 1.29 mg/dL (ref 0.57–2.63)

## 2013-02-28 LAB — IFE INTERPRETATION

## 2013-03-08 ENCOUNTER — Encounter: Payer: Self-pay | Admitting: Family Medicine

## 2013-03-08 ENCOUNTER — Ambulatory Visit (INDEPENDENT_AMBULATORY_CARE_PROVIDER_SITE_OTHER): Payer: BC Managed Care – PPO | Admitting: Family Medicine

## 2013-03-08 ENCOUNTER — Telehealth: Payer: Self-pay | Admitting: *Deleted

## 2013-03-08 VITALS — BP 127/75 | HR 91 | Temp 97.5°F | Ht 70.0 in | Wt 187.0 lb

## 2013-03-08 DIAGNOSIS — F331 Major depressive disorder, recurrent, moderate: Secondary | ICD-10-CM

## 2013-03-08 DIAGNOSIS — G4700 Insomnia, unspecified: Secondary | ICD-10-CM

## 2013-03-08 DIAGNOSIS — R51 Headache: Secondary | ICD-10-CM

## 2013-03-08 MED ORDER — CITALOPRAM HYDROBROMIDE 20 MG PO TABS: 20.0000 mg | ORAL_TABLET | Freq: Every day | ORAL | Status: DC

## 2013-03-08 NOTE — Progress Notes (Signed)
   Subjective:    Patient ID: Nancy Parks, female    DOB: Feb 07, 1951, 63 y.o.   MRN: [id number]  HPI  Followup recurrent depression-patient is interested in increasing her citalopram. She noticed that she's been writing in her diary she just felt more down over all. She think out of the house for couple weeks to house sit for some friends which she normally enjoys and says really didn't seem to have joy in doing it. She denies any new stressors. She also takes amitriptyline at bedtime. Has felt a little more down lately. Has been taking 20mg  of citalopram since last Friday (3 days). She is also interested in talking to a counselor. Her house has been on the market for 2 years.    Has been having spikes of pain in her head. Has been having more migraines.  Has been using generic imitrex as her insurance won't cover her other triptan.  Sleep quality has been less than usual. She is still undergoing chemotherapy daily for her MM. She is hoping that in the spring she'll be able to change to a maintenance dose therapy.   Review of Systems     Objective:   Physical Exam  Constitutional: She is oriented to person, place, and time. She appears well-developed and well-nourished.  HENT:  Head: Normocephalic and atraumatic.  Eyes: Conjunctivae and EOM are normal.  Cardiovascular: Normal rate.   Pulmonary/Chest: Effort normal.  Neurological: She is alert and oriented to person, place, and time.  Skin: Skin is dry. No pallor.  Psychiatric: She has a normal mood and affect. Her behavior is normal.          Assessment & Plan:  Depression - Will increase celexa to 20mg .  Will refer to counselor. F/U in 1 month to make sure drinking well on regimen and adjust as needed.Marland Kitchen  PHQ- 9 score of 7 today. Not well controlled.    Insomnia-overall her sleep quality has been fair. We discussed a trial of melatonin or valerian root. He should be okay with her medications. Also improving and working on the mid may  help as well.  Sharp headaches-fortunately these are brief. They are not consistent with her typical migraines. I suspect this may be related to the decreased mood and poor sleep quality. We will see if this improves her the next time I see her. She is Re: on amitriptyline.

## 2013-03-08 NOTE — Telephone Encounter (Addendum)
Message copied by Orlando Penner on Tue Mar 08, 2013  3:12 PM ------      Message from: [name]      Created: Sun Mar 06, 2013  7:51 PM       Call - myeloma protein is still low. Pete ------This message left on pt's mobil phone.

## 2013-03-23 ENCOUNTER — Other Ambulatory Visit: Payer: Self-pay | Admitting: Nurse Practitioner

## 2013-03-23 DIAGNOSIS — C9000 Multiple myeloma not having achieved remission: Secondary | ICD-10-CM

## 2013-03-23 MED ORDER — LENALIDOMIDE 15 MG PO CAPS: 15.0000 mg | ORAL_CAPSULE | Freq: Every day | ORAL | Status: DC

## 2013-04-05 ENCOUNTER — Ambulatory Visit: Payer: Self-pay | Admitting: Family Medicine

## 2013-04-08 ENCOUNTER — Telehealth: Payer: Self-pay | Admitting: *Deleted

## 2013-04-08 NOTE — Telephone Encounter (Signed)
Patient called stating she has red pin pricks on lower abdomen that are itchy. Looks as if someone took a red marker and made many dots.  Dr. Marin Olp states to try Cortisone cream to see if this helps.

## 2013-04-22 ENCOUNTER — Ambulatory Visit (INDEPENDENT_AMBULATORY_CARE_PROVIDER_SITE_OTHER): Payer: BC Managed Care – PPO | Admitting: Family Medicine

## 2013-04-22 ENCOUNTER — Encounter: Payer: Self-pay | Admitting: Family Medicine

## 2013-04-22 VITALS — BP 120/64 | HR 100 | Wt 185.0 lb

## 2013-04-22 DIAGNOSIS — F3289 Other specified depressive episodes: Secondary | ICD-10-CM

## 2013-04-22 DIAGNOSIS — F32A Depression, unspecified: Secondary | ICD-10-CM

## 2013-04-22 DIAGNOSIS — C9000 Multiple myeloma not having achieved remission: Secondary | ICD-10-CM

## 2013-04-22 DIAGNOSIS — F329 Major depressive disorder, single episode, unspecified: Secondary | ICD-10-CM

## 2013-04-22 MED ORDER — CITALOPRAM HYDROBROMIDE 20 MG PO TABS: 20.0000 mg | ORAL_TABLET | Freq: Every day | ORAL | Status: DC

## 2013-04-22 NOTE — Progress Notes (Signed)
   Subjective:    Patient ID: Nancy Parks, female    DOB: [birth year], 63 y.o.   MRN: [id number]  HPI Depression - sleeping well. Mood is much better on increased on dose.  Didn't start seeing conselor.  She feels the 20mg  is working better.  No SE on the higher dose.  Gets a lot of fatigue from her chemotherapy. She will go down on her dose in April for maintenance dose for her chemotherapy. She's happy about this. She feels that her stress levels have eased off a little bit. She's been able to do with things little bit better since increasing the medication. She's not interested in seeing a counselor at this time. She still having difficulty trying to sell her house. She would eventually like to move to [place].   Review of Systems     Objective:   Physical Exam  Constitutional: She appears well-developed and well-nourished.  Skin: Skin is warm and dry.  Psychiatric: She has a normal mood and affect. Her behavior is normal.          Assessment & Plan:  Depression - well controlled.  PHQ 9 score of 4 today.  F/U in 2-3 months. Call if any problems or feels like her depression is getting worse. Much improvement on the increased dose of Celexa. Declined counseling today but certainly she can call if she changes her mind.

## 2013-04-27 ENCOUNTER — Other Ambulatory Visit: Payer: Self-pay | Admitting: *Deleted

## 2013-04-27 DIAGNOSIS — C9000 Multiple myeloma not having achieved remission: Secondary | ICD-10-CM

## 2013-04-27 MED ORDER — LENALIDOMIDE 15 MG PO CAPS: 15.0000 mg | ORAL_CAPSULE | Freq: Every day | ORAL | Status: DC

## 2013-05-07 ENCOUNTER — Other Ambulatory Visit: Payer: Self-pay | Admitting: Family Medicine

## 2013-05-26 ENCOUNTER — Ambulatory Visit (HOSPITAL_BASED_OUTPATIENT_CLINIC_OR_DEPARTMENT_OTHER): Payer: BC Managed Care – PPO | Admitting: Hematology & Oncology

## 2013-05-26 ENCOUNTER — Encounter: Payer: Self-pay | Admitting: Hematology & Oncology

## 2013-05-26 ENCOUNTER — Ambulatory Visit (HOSPITAL_BASED_OUTPATIENT_CLINIC_OR_DEPARTMENT_OTHER): Payer: BC Managed Care – PPO | Admitting: Lab

## 2013-05-26 ENCOUNTER — Encounter: Payer: Self-pay | Admitting: *Deleted

## 2013-05-26 VITALS — BP 132/68 | HR 74 | Temp 97.8°F | Resp 14 | Ht 70.0 in | Wt 186.0 lb

## 2013-05-26 DIAGNOSIS — C9000 Multiple myeloma not having achieved remission: Secondary | ICD-10-CM

## 2013-05-26 DIAGNOSIS — D72819 Decreased white blood cell count, unspecified: Secondary | ICD-10-CM

## 2013-05-26 LAB — CBC WITH DIFFERENTIAL (CANCER CENTER ONLY)
BASO#: 0 10*3/uL (ref 0.0–0.2)
BASO%: 0.6 % (ref 0.0–2.0)
EOS ABS: 0 10*3/uL (ref 0.0–0.5)
EOS%: 2.2 % (ref 0.0–7.0)
HCT: 37.4 % (ref 34.8–46.6)
HGB: 12.5 g/dL (ref 11.6–15.9)
LYMPH#: 1.3 10*3/uL (ref 0.9–3.3)
LYMPH%: 69.1 % — ABNORMAL HIGH (ref 14.0–48.0)
MCH: 32.5 pg (ref 26.0–34.0)
MCHC: 33.4 g/dL (ref 32.0–36.0)
MCV: 97 fL (ref 81–101)
MONO#: 0.2 10*3/uL (ref 0.1–0.9)
MONO%: 8.3 % (ref 0.0–13.0)
NEUT%: 19.8 % — ABNORMAL LOW (ref 39.6–80.0)
NEUTROS ABS: 0.4 10*3/uL — AB (ref 1.5–6.5)
Platelets: 277 10*3/uL (ref 145–400)
RBC: 3.85 10*6/uL (ref 3.70–5.32)
RDW: 14.8 % (ref 11.1–15.7)
WBC: 1.8 10*3/uL — AB (ref 3.9–10.0)

## 2013-05-26 LAB — LIPID PANEL
Cholesterol: 158 mg/dL (ref 0–200)
HDL: 62 mg/dL (ref >39)
LDL Cholesterol: 72 mg/dL (ref 0–99)
Total CHOL/HDL Ratio: 2.5 Ratio
Triglycerides: 121 mg/dL (ref <150)
VLDL: 24 mg/dL (ref 0–40)

## 2013-05-26 LAB — CHCC SATELLITE - SMEAR

## 2013-05-26 NOTE — Progress Notes (Signed)
Received note from [facility] that patients Rx was shipped on 05/25/13 and scheduled for delivery on next business day.

## 2013-05-26 NOTE — Progress Notes (Signed)
Hematology and Oncology Follow Up Visit  Nancy Parks [id number] 10-22-1950 63 y.o. 05/26/2013   Principle Diagnosis:   IgG lambda smoldering myeloma  Chronic leukopenia-possibly autoimmune  Current Therapy:   Patient to stop Revlimid Aspirin 81 mg by mouth daily    Interim History:  Ms.  Nancy Parks is back for followup. She feels tired. She's not as active. I think a lot of this might be from the Revlimid.  We last checked her myeloma studies, her M spike was 0.68 g/dL. Her IgG level was 1190 mg/dL. Her lambda light chain was 1.29 mg/dL. As such, I really think that holding the Revlimid would be reasonable. I think this is just causing her more toxicity than benefit.  She is chronic leukopenia. The bone marrow biopsies that we have done are have never shown any underlying myelo dysplastic issue. I think she likely has some autoimmune problem.  She's had no fever. There is no cough. There is no bleeding. There is no change in bowel or bladder habits. She's had no rashes. There's been no leg swelling.  Overall, her performance status is ECOG 1.  Medications: Current outpatient prescriptions:amitriptyline (ELAVIL) 50 MG tablet, TAKE 1 TABLET(S) BY MOUTH EVERY NIGHT AT BEDTIME, Disp: 30 tablet, Rfl: 1;  aspirin EC 81 MG tablet, Take 162 mg by mouth every evening., Disp: , Rfl: ;  Calcium-Magnesium-Vitamin D [phone] MG-MG-UNIT TABS, Take by mouth every morning., Disp: , Rfl: ;  citalopram (CELEXA) 20 MG tablet, TAKE 1 TABLET (20 MG TOTAL) BY MOUTH DAILY., Disp: 30 tablet, Rfl: 2 famciclovir (FAMVIR) 250 MG tablet, TAKE 1 TABLET A DAY FOR SHINGLES PREVENTION., Disp: 30 tablet, Rfl: 5;  FOLIC GEXB-M8-U13-K PO, Take 4,400 Units by mouth every morning., Disp: , Rfl: ;  lenalidomide (REVLIMID) 15 MG capsule, Take 1 capsule (15 mg total) by mouth daily. 21 days on 7 days off. Auth # [id number], Disp: 21 capsule, Rfl: 0;  Multiple Vitamin (MULTIVITAMIN WITH MINERALS) TABS, Take 1 tablet by mouth daily.  powder, Disp: , Rfl:  NON FORMULARY, Take by mouth daily. ADRENAL HEALTH 2 TABS TID--FOOD ENZYMES 2 TBS TID, Disp: , Rfl: ;  NON FORMULARY, Take 1 capsule by mouth 2 (two) times daily. IODINE 30 MG & L-TYROSINE 400 MG.  (SAME CAPSULE), Disp: , Rfl: ;  OVER THE COUNTER MEDICATION, Take 1 capsule by mouth every morning. Spleen Activator, Disp: , Rfl: ;  OVER THE COUNTER MEDICATION, Take 3 mg by mouth daily. Prolamine Iodine, Disp: , Rfl:  Probiotic Product (PROBIOTIC DAILY PO), Take 1 capsule by mouth daily., Disp: , Rfl: ;  Specialty Vitamins Products (MAGNESIUM, AMINO ACID CHELATE,) 133 MG tablet, Take 1 tablet by mouth 2 (two) times daily. 1000 mg bid, Disp: , Rfl: ;  SUMAtriptan (IMITREX) 100 MG tablet, Take 100 mg by mouth as needed for migraine., Disp: , Rfl:   Allergies:  Allergies  Allergen Reactions  . Latex     REACTION: Rash    Past Medical History, Surgical history, Social history, and Family History were reviewed and updated.  Review of Systems: As above  Physical Exam:  height is 5\' 10"  (1.778 m) and weight is 186 lb (84.369 kg). Her oral temperature is 97.8 F (36.6 C). Her blood pressure is 132/68 and her pulse is 74. Her respiration is 14.   Lungs are clear. Cardiac exam regular in rhythm. She has no murmurs rubs or bruits. Abdomen is soft. She has good bowel sounds. There is no fluid wave. There is no  palpable liver or spleen tip. Back exam no tenderness over the spine ribs or hips. Extremities shows no clubbing cyanosis or edema. Has good range of motion of her joints. Has good muscle strength. Skin exam shows no rashes ecchymosis or petechia. Neurological exam no focal neurological deficits. Head and neck exam shows no adenopathy in the neck. Thyroid is nonpalpable. No ocular or oral lesions are noted.  Lab Results  Component Value Date   WBC 1.8* 05/26/2013   HGB 12.5 05/26/2013   HCT 37.4 05/26/2013   MCV 97 05/26/2013   PLT 277 05/26/2013     Chemistry      Component Value  Date/Time   NA 137 10/27/2012 1148   NA 141 10/13/2012 1322   K 3.9 10/27/2012 1148   K 4.1 10/13/2012 1322   CL 105 10/27/2012 1148   CL 100 10/13/2012 1322   CO2 26 10/27/2012 1148   CO2 27 10/13/2012 1322   BUN 8 10/27/2012 1148   BUN 9 10/13/2012 1322   CREATININE 0.80 10/27/2012 1148   CREATININE 0.6 10/13/2012 1322      Component Value Date/Time   CALCIUM 8.6 10/27/2012 1148   CALCIUM 9.0 10/13/2012 1322   ALKPHOS 89 10/27/2012 1148   ALKPHOS 99* 10/13/2012 1322   AST 14 10/27/2012 1148   AST 28 10/13/2012 1322   ALT 15 10/27/2012 1148   ALT 47 10/13/2012 1322   BILITOT 0.4 10/27/2012 1148   BILITOT 0.80 10/13/2012 1322      On her blood smear, I see no rouleau formation. She has no nucleated red cells. White cells are decreased in number. She has an increase in lymphocytes. They appear mature. Myeloid cells a. Well segmented. I do not see any blasts. I do not see any hypersegmented polys. Platelets are adequate number size.   Impression and Plan: Nancy Parks is a 63 year old female. She has a history of IgG lambda myeloma. We did treat her with Velcade and Revlimid and Decadron. She responded nicely. However, her white cells still remained low. I don't see anything that the myeloma would be doing that would be causing her white cells to be on the low side. She's always had this. I think this is more autoimmune.  Again we will get her off Revlimid. We will see how she does.  I want to see her back in 2 months time.  I do not see a need for any type of marrows biopsies that need to be done. I don't see that we have to do any x-rays are.  Is negative half are with her today in talking to her about our changes. She's happy to get off the Revlimid.  I told her to stop her famciclovir  in about 3 weeks.   [name], MD 4/9/20151:24 PM

## 2013-05-30 ENCOUNTER — Encounter: Payer: Self-pay | Admitting: Nurse Practitioner

## 2013-05-30 LAB — COMPREHENSIVE METABOLIC PANEL
ALBUMIN: 4.1 g/dL (ref 3.5–5.2)
ALT: 34 U/L (ref 0–35)
AST: 24 U/L (ref 0–37)
Alkaline Phosphatase: 124 U/L — ABNORMAL HIGH (ref 39–117)
BUN: 6 mg/dL (ref 6–23)
CALCIUM: 8.9 mg/dL (ref 8.4–10.5)
CO2: 26 meq/L (ref 19–32)
Chloride: 105 mEq/L (ref 96–112)
Creatinine, Ser: 0.8 mg/dL (ref 0.50–1.10)
GLUCOSE: 86 mg/dL (ref 70–99)
Potassium: 4 mEq/L (ref 3.5–5.3)
SODIUM: 139 meq/L (ref 135–145)
Total Bilirubin: 0.5 mg/dL (ref 0.2–1.2)
Total Protein: 6.3 g/dL (ref 6.0–8.3)

## 2013-05-30 LAB — PROTEIN ELECTROPHORESIS, SERUM, WITH REFLEX
Albumin ELP: 59.4 % (ref 55.8–66.1)
Alpha-1-Globulin: 6.5 % — ABNORMAL HIGH (ref 2.9–4.9)
Alpha-2-Globulin: 9 % (ref 7.1–11.8)
BETA GLOBULIN: 6.2 % (ref 4.7–7.2)
Beta 2: 3.2 % (ref 3.2–6.5)
GAMMA GLOBULIN: 15.7 % (ref 11.1–18.8)
M-Spike, %: 0.55 g/dL
Total Protein, Serum Electrophoresis: 6.3 g/dL (ref 6.0–8.3)

## 2013-05-30 LAB — IGG, IGA, IGM
IGM, SERUM: 61 mg/dL (ref 52–322)
IgA: 65 mg/dL — ABNORMAL LOW (ref 69–380)
IgG (Immunoglobin G), Serum: 1090 mg/dL (ref 690–1700)

## 2013-05-30 LAB — IFE INTERPRETATION

## 2013-05-30 LAB — KAPPA/LAMBDA LIGHT CHAINS
KAPPA FREE LGHT CHN: 1.45 mg/dL (ref 0.33–1.94)
Kappa:Lambda Ratio: 0.88 (ref 0.26–1.65)
Lambda Free Lght Chn: 1.64 mg/dL (ref 0.57–2.63)

## 2013-06-13 ENCOUNTER — Telehealth: Payer: Self-pay | Admitting: *Deleted

## 2013-06-13 NOTE — Telephone Encounter (Signed)
Pt called and reports that she found a tick on her [place]. She said she didn't think that it had been on her long but wondered if she should be seen. I told her that she should get checked out even if she didn't feel/think that she was actually bitten. I told her to go to an UC to get checked and to f/u with Dr. Madilyn Fireman. Pt voiced understanding and agreed.Nancy Parks

## 2013-06-20 ENCOUNTER — Other Ambulatory Visit: Payer: Self-pay | Admitting: *Deleted

## 2013-06-20 DIAGNOSIS — C9000 Multiple myeloma not having achieved remission: Secondary | ICD-10-CM

## 2013-06-20 MED ORDER — LENALIDOMIDE 15 MG PO CAPS: 15.0000 mg | ORAL_CAPSULE | Freq: Every day | ORAL | Status: DC

## 2013-06-22 ENCOUNTER — Other Ambulatory Visit: Payer: Self-pay | Admitting: *Deleted

## 2013-06-22 DIAGNOSIS — C9000 Multiple myeloma not having achieved remission: Secondary | ICD-10-CM

## 2013-06-22 NOTE — Telephone Encounter (Signed)
Refill not completed for Revlimid.  Authorization number cancelled per dr. Marin Olp.  Called [facility] and [facility] to hold Revlimid for time being

## 2013-07-04 ENCOUNTER — Encounter: Payer: Self-pay | Admitting: Family Medicine

## 2013-07-04 ENCOUNTER — Ambulatory Visit (INDEPENDENT_AMBULATORY_CARE_PROVIDER_SITE_OTHER): Payer: BC Managed Care – PPO | Admitting: Family Medicine

## 2013-07-04 VITALS — BP 122/75 | HR 92 | Wt 188.0 lb

## 2013-07-04 DIAGNOSIS — R5383 Other fatigue: Secondary | ICD-10-CM

## 2013-07-04 DIAGNOSIS — L989 Disorder of the skin and subcutaneous tissue, unspecified: Secondary | ICD-10-CM

## 2013-07-04 DIAGNOSIS — L570 Actinic keratosis: Secondary | ICD-10-CM

## 2013-07-04 DIAGNOSIS — R5381 Other malaise: Secondary | ICD-10-CM

## 2013-07-04 DIAGNOSIS — F329 Major depressive disorder, single episode, unspecified: Secondary | ICD-10-CM

## 2013-07-04 DIAGNOSIS — F32A Depression, unspecified: Secondary | ICD-10-CM

## 2013-07-04 MED ORDER — AMITRIPTYLINE HCL 50 MG PO TABS: ORAL_TABLET | ORAL | Status: DC

## 2013-07-04 MED ORDER — CITALOPRAM HYDROBROMIDE 20 MG PO TABS: ORAL_TABLET | ORAL | Status: DC

## 2013-07-04 NOTE — Progress Notes (Signed)
   Subjective:    Patient ID: Nancy Parks, female    DOB: 11-26-1950, 63 y.o.   MRN: [id number]  HPI F/u depression - she is doing well overall. Says her mood has been stable.  She's had a lot of supportive friends around her and has decided to stay in the [facility] area and said moving to [place]. Finally sold her house.  She feels like her ability to mentally process things it not working well and starting to affect her daly life. She still feels really fatigued. She's not had any negative side effects from her medication. She has had a lot of fatigue and has opted to hold on her chemotherapy for a while. But she will likely restart it this summer.  Middle finger right hand with lesion x 2-3 months. [place]ed[place]. Not pain. No trauam. She also has 2 lesions on her left hand on the first and second fingers that she would like me to look at today. She says it's been scaly. Has not changed in size and it has been there for him as to year.  Review of Systems     Objective:   Physical Exam  Constitutional: She is oriented to person, place, and time. She appears well-developed and well-nourished.  HENT:  Head: Normocephalic and atraumatic.  Eyes: Conjunctivae and EOM are normal.  Cardiovascular: Normal rate.   Pulmonary/Chest: Effort normal.  Musculoskeletal:  On the dorsum of the middle finger on the right hand she has a posterior with a little bit of erythema around it. Approximately 1-2 mm in size. No open wounds or drainage. On the dorsum of the first and second finger she has some slight erythema with thick scale keratotic growth on the surface. Approximately 3 mm in size.  Neurological: She is alert and oriented to person, place, and time.  Skin: Skin is dry. No pallor.  Psychiatric: She has a normal mood and affect. Her behavior is normal.          Assessment & Plan:  Depression - well-controlled on current regimen. PHQ 9 score of 4. Her biggest symptom was fatigue. Which is most  likely from her multiple myeloma and chemotherapy.  Lesion on posterior finger-by appearance it looked somewhat similar to a milia/pustule. I did [place] and send a culture today. Though content expressed was thick white material most consistent with a milia. As is unusual to see this on her finger. We'll call the culture results once available. If it recurs then we'll likely need to have artery performed at the base to prevent recurrence.  Actinic keratosis - gave reassurance and explained her that this is a precancerous type lesion typically caused by sun damage. She has a followup with her dermatologist in about 3-4 months. I would be happy to do cryotherapy if she would like or she can just wait until her appointment. I think it would be reasonable to wait that long. If it changes in size then please let us know sooner.

## 2013-07-06 LAB — WOUND CULTURE
Gram Stain: NONE SEEN
Organism ID, Bacteria: NO GROWTH

## 2013-07-28 ENCOUNTER — Ambulatory Visit (HOSPITAL_BASED_OUTPATIENT_CLINIC_OR_DEPARTMENT_OTHER): Payer: BC Managed Care – PPO | Admitting: Lab

## 2013-07-28 ENCOUNTER — Ambulatory Visit (HOSPITAL_BASED_OUTPATIENT_CLINIC_OR_DEPARTMENT_OTHER): Payer: BC Managed Care – PPO | Admitting: Hematology & Oncology

## 2013-07-28 DIAGNOSIS — C9000 Multiple myeloma not having achieved remission: Secondary | ICD-10-CM

## 2013-07-28 DIAGNOSIS — D708 Other neutropenia: Secondary | ICD-10-CM

## 2013-07-28 LAB — CBC WITH DIFFERENTIAL (CANCER CENTER ONLY)
BASO#: 0 10*3/uL (ref 0.0–0.2)
BASO%: 0.4 % (ref 0.0–2.0)
EOS%: 0.4 % (ref 0.0–7.0)
Eosinophils Absolute: 0 10*3/uL (ref 0.0–0.5)
HCT: 37.4 % (ref 34.8–46.6)
HGB: 12.8 g/dL (ref 11.6–15.9)
LYMPH#: 1.3 10*3/uL (ref 0.9–3.3)
LYMPH%: 48.3 % — AB (ref 14.0–48.0)
MCH: 33.1 pg (ref 26.0–34.0)
MCHC: 34.2 g/dL (ref 32.0–36.0)
MCV: 97 fL (ref 81–101)
MONO#: 0.2 10*3/uL (ref 0.1–0.9)
MONO%: 8.2 % (ref 0.0–13.0)
NEUT#: 1.2 10*3/uL — ABNORMAL LOW (ref 1.5–6.5)
NEUT%: 42.7 % (ref 39.6–80.0)
PLATELETS: 294 10*3/uL (ref 145–400)
RBC: 3.87 10*6/uL (ref 3.70–5.32)
RDW: 13.8 % (ref 11.1–15.7)
WBC: 2.7 10*3/uL — ABNORMAL LOW (ref 3.9–10.0)

## 2013-07-28 LAB — CMP (CANCER CENTER ONLY)
ALK PHOS: 104 U/L — AB (ref 26–84)
ALT(SGPT): 28 U/L (ref 10–47)
AST: 26 U/L (ref 11–38)
Albumin: 3.6 g/dL (ref 3.3–5.5)
BUN: 10 mg/dL (ref 7–22)
CO2: 31 mEq/L (ref 18–33)
CREATININE: 0.7 mg/dL (ref 0.6–1.2)
Calcium: 9.1 mg/dL (ref 8.0–10.3)
Chloride: 100 mEq/L (ref 98–108)
GLUCOSE: 92 mg/dL (ref 73–118)
Potassium: 3.9 mEq/L (ref 3.3–4.7)
Sodium: 142 mEq/L (ref 128–145)
Total Bilirubin: 0.7 mg/dl (ref 0.20–1.60)
Total Protein: 7.2 g/dL (ref 6.4–8.1)

## 2013-07-29 ENCOUNTER — Encounter (HOSPITAL_COMMUNITY): Payer: Self-pay

## 2013-07-29 NOTE — Progress Notes (Signed)
Hematology and Oncology Follow Up Visit  Nancy Parks [id number] 07-21-1950 63 y.o. 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  Parks is back for followup. She feels better.  She is off revlimid. We last checked her myeloma studies, her M spike was 0.55 g/dL. Her IgG level was 1090 mg/dL. Her lambda light chain was 1.64 mg/dL.   She is chronic leukopenia. The bone marrow biopsies that we have done are have never shown any underlying myelo dysplastic issue. I think she likely has some autoimmune problem.  She's had no fever. There is no cough. There is no bleeding. There is no change in bowel or bladder habits. She's had no rashes. There's been no leg swelling.  Overall, her performance status is ECOG 1.  Medications: Current outpatient prescriptions:amitriptyline (ELAVIL) 50 MG tablet, TAKE 1 TABLET(S) BY MOUTH EVERY NIGHT AT BEDTIME, Disp: 90 tablet, Rfl: 1;  aspirin EC 81 MG tablet, Take 81 mg by mouth every evening. , Disp: , Rfl: ;  Calcium-Magnesium-Vitamin D 500-250-200 MG-MG-UNIT TABS, Take by mouth every morning., Disp: , Rfl: ;  citalopram (CELEXA) 20 MG tablet, TAKE 1 TABLET (20 MG TOTAL) BY MOUTH DAILY., Disp: 90 tablet, Rfl: 1 famciclovir (FAMVIR) 250 MG tablet, TAKE 1 TABLET A DAY FOR SHINGLES PREVENTION., Disp: 30 tablet, Rfl: 5;  FOLIC [id number]-K PO, Take 4,400 Units by mouth every morning., Disp: , Rfl: ;  Multiple Vitamin (MULTIVITAMIN WITH MINERALS) TABS, Take 1 tablet by mouth daily. powder, Disp: , Rfl: ;  NON FORMULARY, Take by mouth daily. ADRENAL HEALTH 2 TABS TID--FOOD ENZYMES 2 TBS TID, Disp: , Rfl:  NON FORMULARY, Take 1 capsule by mouth 2 (two) times daily. IODINE 30 MG & L-TYROSINE 400 MG.  (SAME CAPSULE), Disp: , Rfl: ;  OVER THE COUNTER MEDICATION, Take 1 capsule by mouth every morning. Spleen  Activator, Disp: , Rfl: ;  Probiotic Product (PROBIOTIC DAILY PO), Take 1 capsule by mouth daily., Disp: , Rfl:  Specialty Vitamins Products (MAGNESIUM, AMINO ACID CHELATE,) 133 MG tablet, Take 1 tablet by mouth 2 (two) times daily. 1000 mg bid, Disp: , Rfl: ;  SUMAtriptan (IMITREX) 100 MG tablet, Take 100 mg by mouth as needed for migraine., Disp: , Rfl:   Allergies:  Allergies  Allergen Reactions  . Latex     REACTION: Rash    Past Medical History, Surgical history, Social history, and Family History were reviewed and updated.  Review of Systems: As above  Physical Exam:  vitals were not taken for this visit.  Lungs are clear. Cardiac exam regular in rhythm. She has no murmurs rubs or bruits. Abdomen is soft. She has good bowel sounds. There is no fluid wave. There is no palpable liver or spleen tip. Back exam no tenderness over the spine ribs or hips. Extremities shows no clubbing cyanosis or edema. Has good range of motion of her joints. Has good muscle strength. Skin exam shows no rashes ecchymosis or petechia. Neurological exam no focal neurological deficits. Head and neck exam shows no adenopathy in the neck. Thyroid is nonpalpable. No ocular or oral lesions are noted.  Lab Results  Component Value Date   WBC 2.7* 07/28/2013   HGB 12.8 07/28/2013   HCT 37.4 07/28/2013   MCV 97 07/28/2013   PLT 294 07/28/2013     Chemistry  Component Value Date/Time   NA 142 07/28/2013 0903   NA 139 05/26/2013 1116   K 3.9 07/28/2013 0903   K 4.0 05/26/2013 1116   CL 100 07/28/2013 0903   CL 105 05/26/2013 1116   CO2 31 07/28/2013 0903   CO2 26 05/26/2013 1116   BUN 10 07/28/2013 0903   BUN 6 05/26/2013 1116   CREATININE 0.7 07/28/2013 0903   CREATININE 0.80 05/26/2013 1116      Component Value Date/Time   CALCIUM 9.1 07/28/2013 0903   CALCIUM 8.9 05/26/2013 1116   ALKPHOS 104* 07/28/2013 0903   ALKPHOS 124* 05/26/2013 1116   AST 26 07/28/2013 0903   AST 24 05/26/2013 1116   ALT 28 07/28/2013 0903   ALT  34 05/26/2013 1116   BILITOT 0.70 07/28/2013 0903   BILITOT 0.5 05/26/2013 1116      On her blood smear, I see no rouleau formation. She has no nucleated red cells. White cells are decreased in number. She has an increase in lymphocytes. They appear mature. Myeloid cells a. Well segmented. I do not see any blasts. I do not see any hypersegmented polys. Platelets are adequate number size.   Impression and Plan: Nancy Parks is a 63 year old female. She has a history of IgG lambda myeloma. We did treat her with Velcade and Revlimid and Decadron. She responded nicely. However, her white cells still remained low. I don't see anything that the myeloma would be doing that would be causing her white cells to be on the low side. She's always had this. I think this is more autoimmune.  Again we will keep her off Revlimid. We will see how she does.  I want to see her back in 2 months time.  I do not see a need for any type of marrows biopsies that need to be done. I don't see that we have to do any x-rays are.  Marland Kitchen   Nancy Napoleon, MD 6/12/20156:18 AM

## 2013-08-01 ENCOUNTER — Encounter: Payer: Self-pay | Admitting: Nurse Practitioner

## 2013-08-01 LAB — PROTEIN ELECTROPHORESIS, SERUM, WITH REFLEX
ALBUMIN ELP: 61.6 % (ref 55.8–66.1)
ALPHA-1-GLOBULIN: 4 % (ref 2.9–4.9)
Alpha-2-Globulin: 9.1 % (ref 7.1–11.8)
Beta 2: 4 % (ref 3.2–6.5)
Beta Globulin: 5.6 % (ref 4.7–7.2)
Gamma Globulin: 15.7 % (ref 11.1–18.8)
M-Spike, %: 0.7 g/dL
Total Protein, Serum Electrophoresis: 7 g/dL (ref 6.0–8.3)

## 2013-08-01 LAB — IGG, IGA, IGM
IGG (IMMUNOGLOBIN G), SERUM: 1200 mg/dL (ref 690–1700)
IgA: 59 mg/dL — ABNORMAL LOW (ref 69–380)
IgM, Serum: 73 mg/dL (ref 52–322)

## 2013-08-01 LAB — LACTATE DEHYDROGENASE: LDH: 139 U/L (ref 94–250)

## 2013-08-01 LAB — KAPPA/LAMBDA LIGHT CHAINS
KAPPA LAMBDA RATIO: 0.5 (ref 0.26–1.65)
Kappa free light chain: 0.56 mg/dL (ref 0.33–1.94)
Lambda Free Lght Chn: 1.11 mg/dL (ref 0.57–2.63)

## 2013-08-01 LAB — IFE INTERPRETATION

## 2013-09-06 ENCOUNTER — Other Ambulatory Visit: Payer: Self-pay | Admitting: Family Medicine

## 2013-10-20 ENCOUNTER — Ambulatory Visit (HOSPITAL_BASED_OUTPATIENT_CLINIC_OR_DEPARTMENT_OTHER): Payer: BC Managed Care – PPO | Admitting: Hematology & Oncology

## 2013-10-20 ENCOUNTER — Encounter: Payer: Self-pay | Admitting: Hematology & Oncology

## 2013-10-20 ENCOUNTER — Ambulatory Visit (HOSPITAL_BASED_OUTPATIENT_CLINIC_OR_DEPARTMENT_OTHER): Payer: BC Managed Care – PPO | Admitting: Lab

## 2013-10-20 VITALS — BP 109/70 | HR 88 | Temp 98.2°F | Resp 16 | Ht 69.0 in | Wt 184.0 lb

## 2013-10-20 DIAGNOSIS — C9000 Multiple myeloma not having achieved remission: Secondary | ICD-10-CM

## 2013-10-20 LAB — CBC WITH DIFFERENTIAL (CANCER CENTER ONLY)
BASO#: 0 10*3/uL (ref 0.0–0.2)
BASO%: 0.5 % (ref 0.0–2.0)
EOS ABS: 0 10*3/uL (ref 0.0–0.5)
EOS%: 0.5 % (ref 0.0–7.0)
HCT: 36.7 % (ref 34.8–46.6)
HEMOGLOBIN: 12.5 g/dL (ref 11.6–15.9)
LYMPH#: 1 10*3/uL (ref 0.9–3.3)
LYMPH%: 47.2 % (ref 14.0–48.0)
MCH: 32.2 pg (ref 26.0–34.0)
MCHC: 34.1 g/dL (ref 32.0–36.0)
MCV: 95 fL (ref 81–101)
MONO#: 0.2 10*3/uL (ref 0.1–0.9)
MONO%: 7.8 % (ref 0.0–13.0)
NEUT#: 1 10*3/uL — ABNORMAL LOW (ref 1.5–6.5)
NEUT%: 44 % (ref 39.6–80.0)
Platelets: 273 10*3/uL (ref 145–400)
RBC: 3.88 10*6/uL (ref 3.70–5.32)
RDW: 12.9 % (ref 11.1–15.7)
WBC: 2.2 10*3/uL — AB (ref 3.9–10.0)

## 2013-10-20 LAB — CMP (CANCER CENTER ONLY)
ALT(SGPT): 20 U/L (ref 10–47)
AST: 17 U/L (ref 11–38)
Albumin: 3.7 g/dL (ref 3.3–5.5)
Alkaline Phosphatase: 97 U/L — ABNORMAL HIGH (ref 26–84)
BILIRUBIN TOTAL: 0.9 mg/dL (ref 0.20–1.60)
BUN, Bld: 6 mg/dL — ABNORMAL LOW (ref 7–22)
CO2: 26 meq/L (ref 18–33)
CREATININE: 0.9 mg/dL (ref 0.6–1.2)
Calcium: 8.5 mg/dL (ref 8.0–10.3)
Chloride: 102 mEq/L (ref 98–108)
Glucose, Bld: 105 mg/dL (ref 73–118)
Potassium: 3.5 mEq/L (ref 3.3–4.7)
SODIUM: 139 meq/L (ref 128–145)
Total Protein: 7.1 g/dL (ref 6.4–8.1)

## 2013-10-20 NOTE — Progress Notes (Signed)
Hematology and Oncology Follow Up Visit  Nancy Parks [id number] [birth year] 63 y.o. 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  Parks is back for followup. We last saw Nancy Parks back in June of. She's been doing quite well. She's had no problems. She's working. She's traveling.  His been no abdominal pain. She had no nausea vomiting. There's been no change in bowel or bladder habits. She's had no rashes. She's had no leg swelling. She's had no cough. She had no fever.  We last saw Nancy Parks, Nancy Parks monoclonal spike was 0.7 g/L. Nancy Parks IgG level was 1200 mg/dL. Nancy Parks lambda light she is 1.11 mg/dL.  Medications: Current outpatient prescriptions:amitriptyline (ELAVIL) 50 MG tablet, TAKE 1 TABLET(S) BY MOUTH EVERY NIGHT AT BEDTIME, Disp: 90 tablet, Rfl: 1;  aspirin EC 81 MG tablet, Take 81 mg by mouth every evening. , Disp: , Rfl: ;  Calcium-Magnesium-Vitamin D 500-250-200 MG-MG-UNIT TABS, Take by mouth every morning., Disp: , Rfl: ;  citalopram (CELEXA) 20 MG tablet, TAKE 1 TABLET (20 MG TOTAL) BY MOUTH DAILY., Disp: 90 tablet, Rfl: 1 FOLIC [id number]-L PO, Take 4,400 Units by mouth every morning., Disp: , Rfl: ;  Multiple Vitamin (MULTIVITAMIN WITH MINERALS) TABS, Take 1 tablet by mouth daily. powder, Disp: , Rfl: ;  NON FORMULARY, Take by mouth daily. ADRENAL HEALTH 2 TABS TID--FOOD ENZYMES 2 TBS TID, Disp: , Rfl: ;  NON FORMULARY, Take 1 capsule by mouth 2 (two) times daily. IODINE 30 MG & L-TYROSINE 400 MG.  (SAME CAPSULE), Disp: , Rfl:  Omega-3 Fatty Acids (OMEGA 3 PO), Take by mouth every morning., Disp: , Rfl: ;  OVER THE COUNTER MEDICATION, Take 1 capsule by mouth every morning. Spleen Activator, Disp: , Rfl: ;  Probiotic Product (PROBIOTIC DAILY PO), Take 1 capsule by mouth daily., Disp: , Rfl: ;  Specialty Vitamins Products (MAGNESIUM, AMINO ACID CHELATE,) 133 MG  tablet, Take 1 tablet by mouth 2 (two) times daily. 1000 mg bid, Disp: , Rfl:  SUMAtriptan (IMITREX) 100 MG tablet, Take 100 mg by mouth as needed for migraine., Disp: , Rfl: ;  UNABLE TO FIND, Take 600 mg by mouth 3 (three) times daily. AGED GARLIC EXTRACT, Disp: , Rfl:   Allergies:  Allergies  Allergen Reactions  . Latex     REACTION: Rash    Past Medical History, Surgical history, Social history, and Family History were reviewed and updated.  Review of Systems: As above  Physical Exam:  height is 5\' 9"  (1.753 m) and weight is 184 lb (83.462 kg). Nancy Parks oral temperature is 98.2 F (36.8 C). Nancy Parks blood pressure is 109/70 and Nancy Parks pulse is 88. Nancy Parks respiration is 16.   Lungs are clear. Cardiac exam regular in rhythm. She has no murmurs rubs or bruits. Abdomen is soft. She has good bowel sounds. There is no fluid wave. There is no palpable liver or spleen tip. Back exam no tenderness over the spine ribs or hips. Extremities shows no clubbing cyanosis or edema. Has good range of motion of Nancy Parks joints. Has good muscle strength. Skin exam shows no rashes ecchymosis or petechia. Neurological exam no focal neurological deficits. Head and neck exam shows no adenopathy in the neck. Thyroid is nonpalpable. No ocular or oral lesions are noted  Lab Results  Component Value Date   WBC 2.2* 10/20/2013   HGB 12.5  10/20/2013   HCT 36.7 10/20/2013   MCV 95 10/20/2013   PLT 273 10/20/2013     Chemistry      Component Value Date/Time   NA 139 10/20/2013 0924   NA 139 05/26/2013 1116   K 3.5 10/20/2013 0924   K 4.0 05/26/2013 1116   CL 102 10/20/2013 0924   CL 105 05/26/2013 1116   CO2 26 10/20/2013 0924   CO2 26 05/26/2013 1116   BUN 6* 10/20/2013 0924   BUN 6 05/26/2013 1116   CREATININE 0.9 10/20/2013 0924   CREATININE 0.80 05/26/2013 1116      Component Value Date/Time   CALCIUM 8.5 10/20/2013 0924   CALCIUM 8.9 05/26/2013 1116   ALKPHOS 97* 10/20/2013 0924   ALKPHOS 124* 05/26/2013 1116   AST 17 10/20/2013 0924   AST 24 05/26/2013  1116   ALT 20 10/20/2013 0924   ALT 34 05/26/2013 1116   BILITOT 0.90 10/20/2013 0924   BILITOT 0.5 05/26/2013 1116         Impression and Plan: Nancy Parks is 63 year old white female. She is IgG lambda smoldering myeloma. She is looking grey-white now. We will see what Nancy Parks monoclonal studies show.  I looked at Nancy Parks blood smear. Nancy Parks white cell count is down a little bit. I do not see any immature myeloid cells. She has decent neutrophils. She is mature appearing lymphocytes.  I think that we can get Nancy Parks back in another 3 months. She's done incredibly well. I don't see any need for any additional studies on Nancy Parks.   [name], MD 9/3/20156:37 PM

## 2013-10-25 ENCOUNTER — Encounter: Payer: Self-pay | Admitting: *Deleted

## 2013-10-25 LAB — PROTEIN ELECTROPHORESIS, SERUM, WITH REFLEX
Albumin ELP: 58.7 % (ref 55.8–66.1)
Alpha-1-Globulin: 8.8 % — ABNORMAL HIGH (ref 2.9–4.9)
Alpha-2-Globulin: 7.7 % (ref 7.1–11.8)
BETA 2: 2.9 % — AB (ref 3.2–6.5)
Beta Globulin: 6 % (ref 4.7–7.2)
GAMMA GLOBULIN: 15.9 % (ref 11.1–18.8)
M-Spike, %: 0.68 g/dL
Total Protein, Serum Electrophoresis: 6.7 g/dL (ref 6.0–8.3)

## 2013-10-25 LAB — KAPPA/LAMBDA LIGHT CHAINS
KAPPA FREE LGHT CHN: 1.01 mg/dL (ref 0.33–1.94)
KAPPA LAMBDA RATIO: 1.12 (ref 0.26–1.65)
Lambda Free Lght Chn: 0.9 mg/dL (ref 0.57–2.63)

## 2013-10-25 LAB — IFE INTERPRETATION

## 2013-10-25 LAB — BETA 2 MICROGLOBULIN, SERUM: BETA 2 MICROGLOBULIN: 1.99 mg/L (ref <=2.51)

## 2013-10-25 LAB — IGG, IGA, IGM
IGA: 62 mg/dL — AB (ref 69–380)
IGG (IMMUNOGLOBIN G), SERUM: 1230 mg/dL (ref 690–1700)
IGM, SERUM: 85 mg/dL (ref 52–322)

## 2013-11-28 ENCOUNTER — Ambulatory Visit (INDEPENDENT_AMBULATORY_CARE_PROVIDER_SITE_OTHER): Payer: BC Managed Care – PPO | Admitting: Family Medicine

## 2013-11-28 ENCOUNTER — Encounter: Payer: Self-pay | Admitting: Family Medicine

## 2013-11-28 VITALS — BP 116/71 | HR 100 | Temp 98.0°F | Ht 69.0 in | Wt 190.0 lb

## 2013-11-28 DIAGNOSIS — M2540 Effusion, unspecified joint: Secondary | ICD-10-CM

## 2013-11-28 DIAGNOSIS — R1013 Epigastric pain: Secondary | ICD-10-CM

## 2013-11-28 DIAGNOSIS — K2970 Gastritis, unspecified, without bleeding: Secondary | ICD-10-CM | POA: Diagnosis not present

## 2013-11-28 LAB — CBC WITH DIFFERENTIAL/PLATELET
Basophils Absolute: 0 10*3/uL (ref 0.0–0.1)
Basophils Relative: 0 % (ref 0–1)
EOS PCT: 0 % (ref 0–5)
Eosinophils Absolute: 0 10*3/uL (ref 0.0–0.7)
HEMATOCRIT: 37.2 % (ref 36.0–46.0)
Hemoglobin: 12.8 g/dL (ref 12.0–15.0)
Lymphocytes Relative: 49 % — ABNORMAL HIGH (ref 12–46)
Lymphs Abs: 1.1 10*3/uL (ref 0.7–4.0)
MCH: 31.4 pg (ref 26.0–34.0)
MCHC: 34.4 g/dL (ref 30.0–36.0)
MCV: 91.2 fL (ref 78.0–100.0)
MONO ABS: 0.2 10*3/uL (ref 0.1–1.0)
Monocytes Relative: 9 % (ref 3–12)
Neutro Abs: 1 10*3/uL — ABNORMAL LOW (ref 1.7–7.7)
Neutrophils Relative %: 42 % — ABNORMAL LOW (ref 43–77)
Platelets: 308 10*3/uL (ref 150–400)
RBC: 4.08 MIL/uL (ref 3.87–5.11)
RDW: 14.6 % (ref 11.5–15.5)
WBC: 2.3 10*3/uL — ABNORMAL LOW (ref 4.0–10.5)

## 2013-11-28 LAB — C-REACTIVE PROTEIN: CRP: <0.5 mg/dL (ref <0.60)

## 2013-11-28 MED ORDER — OMEPRAZOLE 40 MG PO CPDR: 40.0000 mg | DELAYED_RELEASE_CAPSULE | Freq: Every day | ORAL | Status: DC

## 2013-11-28 NOTE — Patient Instructions (Signed)
Food Choices for Gastroesophageal Reflux Disease  When you have gastroesophageal reflux disease (GERD), the foods you eat and your eating habits are very important. Choosing the right foods can help ease your discomfort.   WHAT GUIDELINES DO I NEED TO FOLLOW?   · Choose fruits, vegetables, whole grains, and low-fat dairy products.    · Choose low-fat meat, fish, and poultry.  · Limit fats such as oils, salad dressings, butter, nuts, and avocado.    · Keep a food diary. This helps you identify foods that cause symptoms.    · Avoid foods that cause symptoms. These may be different for everyone.    · Eat small meals often instead of 3 large meals a day.    · Eat your meals slowly, in a place where you are relaxed.    · Limit fried foods.    · Cook foods using methods other than frying.    · Avoid drinking alcohol.    · Avoid drinking large amounts of liquids with your meals.    · Avoid bending over or lying down until 2-3 hours after eating.    WHAT FOODS ARE NOT RECOMMENDED?   These are some foods and drinks that may make your symptoms worse:  Vegetables  Tomatoes. Tomato juice. Tomato and spaghetti sauce. Chili peppers. Onion and garlic. Horseradish.  Fruits  Oranges, grapefruit, and lemon (fruit and juice).  Meats  High-fat meats, fish, and poultry. This includes hot dogs, ribs, ham, sausage, salami, and bacon.  Dairy  Whole milk and chocolate milk. Sour cream. Cream. Butter. Ice cream. Cream cheese.   Drinks  Coffee and tea. Bubbly (carbonated) drinks or energy drinks.  Condiments  Hot sauce. Barbecue sauce.   Sweets/Desserts  Chocolate and cocoa. Donuts. Peppermint and spearmint.  Fats and Oils  High-fat foods. This includes French fries and potato chips.  Other  Vinegar. Strong spices. This includes black pepper, white pepper, red pepper, cayenne, curry powder, cloves, ginger, and chili powder.  The items listed above may not be a complete list of foods and drinks to avoid. Contact your dietitian for more  information.  Document Released: 08/05/2011 Document Revised: 02/08/2013 Document Reviewed: 12/08/2012  [employer]® Patient Information ©2015 [employer]. This information is not intended to replace advice given to you by your health care provider. Make sure you discuss any questions you have with your health care provider.

## 2013-11-28 NOTE — Progress Notes (Signed)
Subjective:    Patient ID: Nancy Parks, female    DOB: 03-10-1950, 63 y.o.   MRN: [id number]  Abdominal Pain   Burning abdominal pain on and off x 1 month. No know triggers.  Usually starts mid day and will last all day.  Says aloe vera juice does seem to help.  Occ has reflux into the upper chest. No NSAIDs or prednisone.  She is no longer on ASA.   She has been eating perpermints.  Hx of hiatal hernia. Not taking any meds for it.    Has been having some joing swelling on her hand as well. Started 5 days ago in the 1st-3rd PIPs on the right hand.  Some pain in the left elbow as well.  She denies any swelling there. Has been resting it. Not taking any meds for it. She denies any injury or overuse trauma. She denies increased heat or warmth. She just says it feels sore. The swelling did improve one day and then went back to. If family history rheumatoid arthritis, psoriatic arthritis, Crohn's disease et Ronney Asters.  Review of Systems  Gastrointestinal: Positive for abdominal pain.        BP 116/71  Pulse 100  Temp(Src) 98 F (36.7 C) (Oral)  Ht 5' 9" (1.753 m)  Wt 190 lb (86.183 kg)  BMI 28.05 kg/m2    Allergies  Allergen Reactions  . Latex     REACTION: Rash    Past Medical History  Diagnosis Date  . Headache(784.0)     Migraines  . Leukopenia   . Depression   . Multiple myeloma, without mention of having achieved remission 07/13/2012  . Leukopenia 07/13/2012    Past Surgical History  Procedure Laterality Date  . Laparoscopic endometriosis fulguration  1979  . Cataract extraction  09/2007  . Basal cell carcinoma excision  2013    removed from nose    History   Social History  . Marital Status: Divorced    Spouse Name: N/A    Number of Children: N/A  . Years of Education: N/A   Occupational History  . accountant   . Life coach    Social History Main Topics  . Smoking status: Never Smoker   . Smokeless tobacco: Never Used     Comment: never used tobacco  .  Alcohol Use: No  . Drug Use: No  . Sexual Activity: Yes    Partners: Male   Other Topics Concern  . Not on file   Social History Narrative   No exercise.  Doing 10 min of yoga.     Family History  Problem Relation Age of Onset  . Breast cancer      grandmother   . Colon cancer Father     colon adenoma  . Hyperlipidemia Sister   . Hypertension Sister   . Cancer Sister     throat, smoker  . Melanoma Sister     Outpatient Encounter Prescriptions as of 11/28/2013  Medication Sig  . amitriptyline (ELAVIL) 50 MG tablet TAKE 1 TABLET(S) BY MOUTH EVERY NIGHT AT BEDTIME  . Calcium-Magnesium-Vitamin D [phone] MG-MG-UNIT TABS Take by mouth every morning.  . citalopram (CELEXA) 20 MG tablet TAKE 1 TABLET (20 MG TOTAL) BY MOUTH DAILY.  Marland Kitchen FOLIC [id number]-[id number]-G PO Take 4,400 Units by mouth every morning.  . Multiple Vitamin (MULTIVITAMIN WITH MINERALS) TABS Take 1 tablet by mouth daily. powder  . NON FORMULARY Take by mouth daily. ADRENAL HEALTH 2 TABS TID--FOOD ENZYMES 2 TBS  TID  . NON FORMULARY Take 1 capsule by mouth 2 (two) times daily. IODINE 30 MG & L-TYROSINE 400 MG.  (SAME CAPSULE)  . Omega-3 Fatty Acids (OMEGA 3 PO) Take by mouth every morning.  Marland Kitchen OVER THE COUNTER MEDICATION Take 1 capsule by mouth every morning. Spleen Activator  . Probiotic Product (PROBIOTIC DAILY PO) Take 1 capsule by mouth daily.  Marland Kitchen Specialty Vitamins Products (MAGNESIUM, AMINO ACID CHELATE,) 133 MG tablet Take 1 tablet by mouth 2 (two) times daily. 1000 mg bid  . SUMAtriptan (IMITREX) 100 MG tablet Take 100 mg by mouth as needed for migraine.  Marland Kitchen UNABLE TO FIND Take 600 mg by mouth 3 (three) times daily. AGED GARLIC EXTRACT  . omeprazole (PRILOSEC) 40 MG capsule Take 1 capsule (40 mg total) by mouth daily.  . [DISCONTINUED] aspirin EC 81 MG tablet Take 81 mg by mouth every evening.        Objective:   Physical Exam  Constitutional: She is oriented to person, place, and time. She appears  well-developed and well-nourished.  HENT:  Head: Normocephalic and atraumatic.  Cardiovascular: Normal rate, regular rhythm and normal heart sounds.   Pulmonary/Chest: Effort normal and breath sounds normal.  Abdominal: Bowel sounds are normal. She exhibits no distension and no mass. There is no tenderness. There is no rebound and no guarding.  Neurological: She is alert and oriented to person, place, and time.  Skin: Skin is warm and dry.  Psychiatric: She has a normal mood and affect. Her behavior is normal.          Assessment & Plan:  Gastritis - like a gastritis based on her description of the pain. Unclear what the trigger may have been. Encouraged her to discontinue any caffeine, citrus, greasy foods etc. Handout provided with additional information on foods to avoid. Stopping peppermints as well. Recommend a PPI for at least 6-8 weeks for healing. Most patients will notice an improvement in her symptoms within one to 2 weeks the medication. Recommend take 30 minutes before the first meal today.  Joint swelling on the right hand-unclear cause. She does have some swelling of the worst 3 third PIP on the right hand. They're mildly tender. No erythema. No acute trauma. I will check a sedimentation rate and a CRP but I did not offer to do a full workup since this is only happened once and started about 5 days ago. If her inflammatory markers are elevated then we'll do additional workup for rheumatoid arthritis etc. in addition to x-rays. She denies any trauma or injury to

## 2013-11-29 LAB — SEDIMENTATION RATE: Sed Rate: 4 mm/hr (ref 0–22)

## 2014-01-04 ENCOUNTER — Other Ambulatory Visit: Payer: Self-pay | Admitting: Family Medicine

## 2014-01-19 ENCOUNTER — Ambulatory Visit (HOSPITAL_BASED_OUTPATIENT_CLINIC_OR_DEPARTMENT_OTHER): Payer: BC Managed Care – PPO | Admitting: Hematology & Oncology

## 2014-01-19 ENCOUNTER — Encounter: Payer: Self-pay | Admitting: Hematology & Oncology

## 2014-01-19 ENCOUNTER — Ambulatory Visit (HOSPITAL_BASED_OUTPATIENT_CLINIC_OR_DEPARTMENT_OTHER): Payer: BC Managed Care – PPO | Admitting: Lab

## 2014-01-19 VITALS — BP 131/76 | HR 89 | Temp 98.0°F | Resp 14 | Ht 69.0 in | Wt 195.0 lb

## 2014-01-19 DIAGNOSIS — C9000 Multiple myeloma not having achieved remission: Secondary | ICD-10-CM

## 2014-01-19 LAB — CMP (CANCER CENTER ONLY)
ALK PHOS: 113 U/L — AB (ref 26–84)
ALT: 25 U/L (ref 10–47)
AST: 24 U/L (ref 11–38)
Albumin: 3.6 g/dL (ref 3.3–5.5)
BILIRUBIN TOTAL: 0.8 mg/dL (ref 0.20–1.60)
BUN, Bld: 11 mg/dL (ref 7–22)
CO2: 29 meq/L (ref 18–33)
Calcium: 9.1 mg/dL (ref 8.0–10.3)
Chloride: 104 mEq/L (ref 98–108)
Creat: 1 mg/dl (ref 0.6–1.2)
Glucose, Bld: 81 mg/dL (ref 73–118)
Potassium: 4.1 mEq/L (ref 3.3–4.7)
SODIUM: 144 meq/L (ref 128–145)
TOTAL PROTEIN: 7.6 g/dL (ref 6.4–8.1)

## 2014-01-19 LAB — CBC WITH DIFFERENTIAL (CANCER CENTER ONLY)
BASO#: 0 10*3/uL (ref 0.0–0.2)
BASO%: 0.9 % (ref 0.0–2.0)
EOS%: 0.4 % (ref 0.0–7.0)
Eosinophils Absolute: 0 10*3/uL (ref 0.0–0.5)
HEMATOCRIT: 38.4 % (ref 34.8–46.6)
HGB: 12.9 g/dL (ref 11.6–15.9)
LYMPH#: 1.2 10*3/uL (ref 0.9–3.3)
LYMPH%: 50.4 % — ABNORMAL HIGH (ref 14.0–48.0)
MCH: 32.3 pg (ref 26.0–34.0)
MCHC: 33.6 g/dL (ref 32.0–36.0)
MCV: 96 fL (ref 81–101)
MONO#: 0.2 10*3/uL (ref 0.1–0.9)
MONO%: 6.9 % (ref 0.0–13.0)
NEUT#: 1 10*3/uL — ABNORMAL LOW (ref 1.5–6.5)
NEUT%: 41.4 % (ref 39.6–80.0)
Platelets: 286 10*3/uL (ref 145–400)
RBC: 3.99 10*6/uL (ref 3.70–5.32)
RDW: 13.3 % (ref 11.1–15.7)
WBC: 2.3 10*3/uL — ABNORMAL LOW (ref 3.9–10.0)

## 2014-01-21 NOTE — Progress Notes (Signed)
Hematology and Oncology Follow Up Visit  Nancy Parks [id number] 1950/12/09 63 y.o. 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  Nancy Parks is back for followup. We last saw her back in October. She had a good Thanksgiving. She was in [place]. She is up there quite a bit to help out with house and pet sitting. She has been doing quite well. She's had no problems. She's working. She's traveling.  There has been no abdominal pain. She had no nausea or vomiting. There's been no change in bowel or bladder habits. She's had no rashes. She's had no leg swelling. She's had no cough. She had no fever.  We last saw her, her monoclonal spike was 0. 68 g/L. Her IgG level was 1230 mg/dL. Her lambda light she is 0.9 mg/dL.  Medications: Current outpatient prescriptions: amitriptyline (ELAVIL) 50 MG tablet, TAKE 1 TABLET(S) BY MOUTH EVERY NIGHT AT BEDTIME, Disp: 90 tablet, Rfl: 1;  Calcium-Magnesium-Vitamin D [phone] MG-MG-UNIT TABS, Take by mouth every morning., Disp: , Rfl: ;  citalopram (CELEXA) 20 MG tablet, TAKE 1 TABLET (20 MG TOTAL) BY MOUTH DAILY., Disp: 90 tablet, Rfl: 1;  FOLIC [id number]-[id number]-Y PO, Take 4,400 Units by mouth every morning., Disp: , Rfl:  Multiple Vitamin (MULTIVITAMIN WITH MINERALS) TABS, Take 1 tablet by mouth daily. powder, Disp: , Rfl: ;  NON FORMULARY, Take by mouth daily. ADRENAL HEALTH 2 TABS TID--FOOD ENZYMES 2 TBS TID, Disp: , Rfl: ;  NON FORMULARY, Take 1 capsule by mouth 2 (two) times daily. IODINE 30 MG & L-TYROSINE 400 MG.  (SAME CAPSULE), Disp: , Rfl: ;  Omega-3 Fatty Acids (OMEGA 3 PO), Take by mouth every morning., Disp: , Rfl:  omeprazole (PRILOSEC) 40 MG capsule, Take 1 capsule (40 mg total) by mouth daily. (Patient taking differently: Take 40 mg by mouth daily. D/C 02-03-14), Disp: 30 capsule, Rfl: 1;  OVER THE COUNTER MEDICATION,  Take 1 capsule by mouth every morning. Spleen Activator, Disp: , Rfl: ;  Probiotic Product (PROBIOTIC DAILY PO), Take 1 capsule by mouth daily., Disp: , Rfl:  Specialty Vitamins Products (MAGNESIUM, AMINO ACID CHELATE,) 133 MG tablet, Take 1 tablet by mouth 2 (two) times daily. 1000 mg bid, Disp: , Rfl: ;  SUMAtriptan (IMITREX) 100 MG tablet, Take 100 mg by mouth as needed for migraine., Disp: , Rfl:   Allergies:  Allergies  Allergen Reactions  . Latex     REACTION: Rash    Past Medical History, Surgical history, Social history, and Family History were reviewed and updated.  Review of Systems: As above  Physical Exam:  height is 5\' 9"  (1.753 m) and weight is 195 lb (88.451 kg). Her oral temperature is 98 F (36.7 C). Her blood pressure is 131/76 and her pulse is 89. Her respiration is 14.   Lungs are clear. Cardiac exam regular rate and rhythm. She has no murmurs rubs or bruits. Abdomen is soft. She has good bowel sounds. There is no fluid wave. There is no palpable liver or spleen tip. Back exam no tenderness over the spine ribs or hips. Extremities shows no clubbing cyanosis or edema. Has good range of motion of her joints. Has good muscle strength. Skin exam shows no rashes ecchymosis or petechia. Neurological exam no focal neurological deficits. Head and neck exam shows no adenopathy in the neck. Thyroid is nonpalpable. No  ocular or oral lesions are noted  Lab Results  Component Value Date   WBC 2.3* 01/19/2014   HGB 12.9 01/19/2014   HCT 38.4 01/19/2014   MCV 96 01/19/2014   PLT 286 01/19/2014     Chemistry      Component Value Date/Time   NA 144 01/19/2014 0908   NA 139 05/26/2013 1116   K 4.1 01/19/2014 0908   K 4.0 05/26/2013 1116   CL 104 01/19/2014 0908   CL 105 05/26/2013 1116   CO2 29 01/19/2014 0908   CO2 26 05/26/2013 1116   BUN 11 01/19/2014 0908   BUN 6 05/26/2013 1116   CREATININE 1.0 01/19/2014 0908   CREATININE 0.80 05/26/2013 1116      Component Value  Date/Time   CALCIUM 9.1 01/19/2014 0908   CALCIUM 8.9 05/26/2013 1116   ALKPHOS 113* 01/19/2014 0908   ALKPHOS 124* 05/26/2013 1116   AST 24 01/19/2014 0908   AST 24 05/26/2013 1116   ALT 25 01/19/2014 0908   ALT 34 05/26/2013 1116   BILITOT 0.80 01/19/2014 0908   BILITOT 0.5 05/26/2013 1116     IgG level is 1310 mg/dL. Lambda light chain is 0.9 mg/dL.  Impression and Plan: Nancy Parks is 63 year old white female. She has IgG lambda smoldering myeloma. She is looking quite good right now. We will see what her monoclonal studies show. I will like to hope that everything is stable.  I looked at her blood smear. Her white cell count is down a little bit. I do not see any immature myeloid cells. She has decent neutrophils. She has mature appearing lymphocytes.  I think that we can get her back in another 4 months. She's done incredibly well. I don't see any need for any additional studies on her.   [name], MD 12/5/20151:23 PM

## 2014-01-23 ENCOUNTER — Telehealth: Payer: Self-pay

## 2014-01-23 LAB — KAPPA/LAMBDA LIGHT CHAINS
KAPPA LAMBDA RATIO: 1.3 (ref 0.26–1.65)
Kappa free light chain: 1.51 mg/dL (ref 0.33–1.94)
Lambda Free Lght Chn: 1.16 mg/dL (ref 0.57–2.63)

## 2014-01-23 LAB — IFE INTERPRETATION

## 2014-01-23 LAB — PROTEIN ELECTROPHORESIS, SERUM, WITH REFLEX
ALPHA-2-GLOBULIN: 9.2 % (ref 7.1–11.8)
Albumin ELP: 61 % (ref 55.8–66.1)
Alpha-1-Globulin: 4.1 % (ref 2.9–4.9)
Beta 2: 3.9 % (ref 3.2–6.5)
Beta Globulin: 5.4 % (ref 4.7–7.2)
Gamma Globulin: 16.4 % (ref 11.1–18.8)
M-SPIKE, %: 0.8 g/dL
Total Protein, Serum Electrophoresis: 7.5 g/dL (ref 6.0–8.3)

## 2014-01-23 LAB — IGG, IGA, IGM
IGM, SERUM: 97 mg/dL (ref 52–322)
IgA: 66 mg/dL — ABNORMAL LOW (ref 69–380)
IgG (Immunoglobin G), Serum: 1310 mg/dL (ref 690–1700)

## 2014-01-23 LAB — BETA 2 MICROGLOBULIN, SERUM: BETA 2 MICROGLOBULIN: 2.23 mg/L (ref <=2.51)

## 2014-01-23 NOTE — Telephone Encounter (Addendum)
-----   Message from [name], MD sent at 01/20/2014  4:32 PM EST ----- Please call and let her know that the myeloma levels are still nice and low. Pete  Pt notified by phone of above note. dph

## 2014-02-06 ENCOUNTER — Other Ambulatory Visit: Payer: Self-pay | Admitting: Family Medicine

## 2014-02-07 ENCOUNTER — Telehealth: Payer: Self-pay | Admitting: *Deleted

## 2014-02-07 NOTE — Telephone Encounter (Signed)
Pt called and stated that her temp was 97.2 she took it again 5 mins later and it was 97.4 she has Dx of multiple myeloma and states that she was told that she should go to the ED if her temp spikes however, she said that she normally doesn't get a high temp and that it goes lower. I made an appt with Dr. Ileene Rubens for her at 9 am and told her that if she should get worse to go directly to the ED she voiced understanding.Nancy Parks

## 2014-02-08 ENCOUNTER — Ambulatory Visit (INDEPENDENT_AMBULATORY_CARE_PROVIDER_SITE_OTHER): Payer: BC Managed Care – PPO | Admitting: Family Medicine

## 2014-02-08 ENCOUNTER — Encounter: Payer: Self-pay | Admitting: Family Medicine

## 2014-02-08 VITALS — BP 124/75 | HR 100 | Temp 98.0°F | Wt 192.0 lb

## 2014-02-08 DIAGNOSIS — J329 Chronic sinusitis, unspecified: Secondary | ICD-10-CM

## 2014-02-08 DIAGNOSIS — B9689 Other specified bacterial agents as the cause of diseases classified elsewhere: Secondary | ICD-10-CM

## 2014-02-08 DIAGNOSIS — A499 Bacterial infection, unspecified: Secondary | ICD-10-CM

## 2014-02-08 DIAGNOSIS — D72819 Decreased white blood cell count, unspecified: Secondary | ICD-10-CM

## 2014-02-08 LAB — CBC WITH DIFFERENTIAL/PLATELET
Basophils Absolute: 0 10*3/uL (ref 0.0–0.1)
Basophils Relative: 0 % (ref 0–1)
EOS ABS: 0 10*3/uL (ref 0.0–0.7)
EOS PCT: 0 % (ref 0–5)
HCT: 37.1 % (ref 36.0–46.0)
Hemoglobin: 12.6 g/dL (ref 12.0–15.0)
LYMPHS ABS: 1.4 10*3/uL (ref 0.7–4.0)
Lymphocytes Relative: 44 % (ref 12–46)
MCH: 32.1 pg (ref 26.0–34.0)
MCHC: 33.8 g/dL (ref 30.0–36.0)
MCV: 94.5 fL (ref 78.0–100.0)
MONO ABS: 0.2 10*3/uL (ref 0.1–1.0)
Monocytes Relative: 5 % (ref 3–12)
Neutro Abs: 1.6 10*3/uL — ABNORMAL LOW (ref 1.7–7.7)
Neutrophils Relative %: 51 % (ref 43–77)
Platelets: 274 10*3/uL (ref 150–400)
RBC: 3.93 MIL/uL (ref 3.87–5.11)
RDW: 14.6 % (ref 11.5–15.5)
WBC: 3.2 10*3/uL — ABNORMAL LOW (ref 4.0–10.5)

## 2014-02-08 MED ORDER — DOXYCYCLINE HYCLATE 100 MG PO TABS: ORAL_TABLET | ORAL | Status: AC

## 2014-02-08 NOTE — Progress Notes (Signed)
CC: Nancy Parks is a 63 y.o. female is here for Nasal Congestion and Cough   Subjective: HPI:   complains of night sweats, cough,  Nasal congestion,shortness of breath and postnasal drip that has been present for the past 4 days. Mild to moderate in severity. She's been taking her temperature and it has been ranging from 97.5-99.5. She was told by her oncologist to go to emergency room if her temperature ever reaches 100.5 or greater.  She has a history of multiple myeloma but is currently not on any chemotherapy.   She has a history of chronic leukopenia.  She's been taking a herbal extract remedy to try to help avoid contracting bronchitis but there's been no other interventions other than above. She denies fevers, unintentional weight gain or loss, wheezing, chest pain, no skin changes. Denies chills.   Review Of Systems Outlined In HPI  Past Medical History  Diagnosis Date  . Headache(784.0)     Migraines  . Leukopenia   . Depression   . Multiple myeloma, without mention of having achieved remission 07/13/2012  . Leukopenia 07/13/2012    Past Surgical History  Procedure Laterality Date  . Laparoscopic endometriosis fulguration  1979  . Cataract extraction  09/2007  . Basal cell carcinoma excision  2013    removed from nose   Family History  Problem Relation Age of Onset  . Breast cancer      grandmother   . Colon cancer Father     colon adenoma  . Hyperlipidemia Sister   . Hypertension Sister   . Cancer Sister     throat, smoker  . Melanoma Sister     History   Social History  . Marital Status: Divorced    Spouse Name: N/A    Number of Children: N/A  . Years of Education: N/A   Occupational History  . accountant   . Life coach    Social History Main Topics  . Smoking status: Never Smoker   . Smokeless tobacco: Never Used     Comment: never used tobacco  . Alcohol Use: No  . Drug Use: No  . Sexual Activity:    Partners: Male   Other Topics Concern  . Not  on file   Social History Narrative   No exercise.  Doing 10 min of yoga.      Objective: BP 124/75 mmHg  Pulse 100  Temp(Src) 98 F (36.7 C) (Oral)  Wt 192 lb (87.091 kg)  SpO2 100%  General: Alert and Oriented, No Acute Distress HEENT: Pupils equal, round, reactive to light. Conjunctivae clear.  External ears unremarkable, canals clear with intact TMs with appropriate landmarks.  Middle ear appears open without effusion. Pink inferior turbinates.  Moist mucous membranes, pharynx without inflammation nor lesions  However moderate postnasal drip..  Neck supple without palpable lymphadenopathy nor abnormal masses. Lungs: Clear to auscultation bilaterally, no wheezing/ronchi/rales.  Comfortable work of breathing. Good air movement. Extremities: No peripheral edema.  Strong peripheral pulses.  Mental Status: No depression, anxiety, nor agitation. Skin: Warm and dry.  Assessment & Plan: Nancy Parks was seen today for nasal congestion and cough.  Diagnoses and associated orders for this visit:  Leukopenia - CBC w/Diff  Bacterial sinusitis - doxycycline (VIBRA-TABS) 100 MG tablet; One by mouth twice a day for ten days.    Bacterial sinusitis: Start doxycycline. Checking white count  To compare to historical values  To ensure that this is not declining and  Her illness representative of something  more serious than bacterial sinusitis.  Reassurance provided that the temperatures she's been recording are normal variations  And that  I agree an emergency room visit would be warranted if she has a fever of  100.3 or greater.  Return if symptoms worsen or fail to improve.

## 2014-02-13 ENCOUNTER — Ambulatory Visit (INDEPENDENT_AMBULATORY_CARE_PROVIDER_SITE_OTHER): Payer: BC Managed Care – PPO | Admitting: Family Medicine

## 2014-02-13 ENCOUNTER — Other Ambulatory Visit: Payer: Self-pay

## 2014-02-13 ENCOUNTER — Encounter: Payer: Self-pay | Admitting: Family Medicine

## 2014-02-13 VITALS — BP 113/63 | HR 89 | Wt 192.0 lb

## 2014-02-13 DIAGNOSIS — F324 Major depressive disorder, single episode, in partial remission: Secondary | ICD-10-CM | POA: Diagnosis not present

## 2014-02-13 DIAGNOSIS — F325 Major depressive disorder, single episode, in full remission: Secondary | ICD-10-CM

## 2014-02-13 MED ORDER — CITALOPRAM HYDROBROMIDE 10 MG PO TABS: 10.0000 mg | ORAL_TABLET | Freq: Every day | ORAL | Status: DC

## 2014-02-13 NOTE — Patient Instructions (Signed)
Decrease to 10mg  for 2 weeks, then 1/2 tab daily for one week, and then stop.

## 2014-02-13 NOTE — Progress Notes (Signed)
   Subjective:    Patient ID: Nancy Parks, female    DOB: 1950-04-28, 63 y.o.   MRN: [id number]  HPI  Follow-up depression-overall doing very well. Her house finally closed. Her divorce is final. She feels like things have just settle down over all and really wants to work on weaning off of her medication. She's currently on citalopram 20 mg daily.  Review of Systems     Objective:   Physical Exam  Constitutional: She is oriented to person, place, and time. She appears well-developed and well-nourished.  HENT:  Head: Normocephalic and atraumatic.  Eyes: Conjunctivae and EOM are normal.  Cardiovascular: Normal rate.   Pulmonary/Chest: Effort normal.  Neurological: She is alert and oriented to person, place, and time.  Skin: Skin is dry. No pallor.  Psychiatric: She has a normal mood and affect. Her behavior is normal.          Assessment & Plan:  Depression-we'll decrease to 10 mg for 2 weeks and then half of a tab for 1 week and then stop. She can surly call she's having any palms or side effects with the taper. Please call. Follow-up as needed.

## 2014-03-27 ENCOUNTER — Other Ambulatory Visit: Payer: Self-pay | Admitting: Family

## 2014-05-05 ENCOUNTER — Telehealth: Payer: Self-pay | Admitting: Hematology & Oncology

## 2014-05-05 NOTE — Telephone Encounter (Signed)
Pt aware moved 4-7 to 4-21

## 2014-05-19 ENCOUNTER — Telehealth: Payer: Self-pay | Admitting: *Deleted

## 2014-05-19 DIAGNOSIS — H269 Unspecified cataract: Secondary | ICD-10-CM

## 2014-05-19 DIAGNOSIS — C9000 Multiple myeloma not having achieved remission: Secondary | ICD-10-CM

## 2014-05-19 NOTE — Telephone Encounter (Signed)
Pt requests a referral to oncologist and ophthalmologist.Pt is already established at both offices.

## 2014-05-22 ENCOUNTER — Telehealth: Payer: Self-pay | Admitting: *Deleted

## 2014-05-22 NOTE — Telephone Encounter (Signed)
Nancy Parks called stating she needs to schedule cataract surgery and wants to know if Dr Marin Olp feels like it would be okay with her hematological condition. Spoke with Dr Marin Olp who states that Nancy Parks is fine to schedule surgery. Notified Nancy Parks who will go forward with scheduling procedure.

## 2014-05-25 ENCOUNTER — Other Ambulatory Visit: Payer: Self-pay | Admitting: Lab

## 2014-05-25 ENCOUNTER — Ambulatory Visit: Payer: Self-pay | Admitting: Hematology & Oncology

## 2014-05-31 DIAGNOSIS — H25012 Cortical age-related cataract, left eye: Secondary | ICD-10-CM | POA: Insufficient documentation

## 2014-05-31 DIAGNOSIS — H2512 Age-related nuclear cataract, left eye: Secondary | ICD-10-CM | POA: Insufficient documentation

## 2014-06-07 DIAGNOSIS — Z961 Presence of intraocular lens: Secondary | ICD-10-CM | POA: Insufficient documentation

## 2014-06-08 ENCOUNTER — Ambulatory Visit (HOSPITAL_BASED_OUTPATIENT_CLINIC_OR_DEPARTMENT_OTHER): Payer: 59

## 2014-06-08 ENCOUNTER — Ambulatory Visit (HOSPITAL_BASED_OUTPATIENT_CLINIC_OR_DEPARTMENT_OTHER): Payer: 59 | Admitting: Hematology & Oncology

## 2014-06-08 ENCOUNTER — Encounter: Payer: Self-pay | Admitting: Hematology & Oncology

## 2014-06-08 VITALS — BP 124/71 | HR 96 | Temp 98.2°F | Resp 14 | Ht 69.0 in | Wt 181.0 lb

## 2014-06-08 DIAGNOSIS — C9000 Multiple myeloma not having achieved remission: Secondary | ICD-10-CM | POA: Diagnosis not present

## 2014-06-08 DIAGNOSIS — D72819 Decreased white blood cell count, unspecified: Secondary | ICD-10-CM | POA: Diagnosis not present

## 2014-06-08 LAB — CBC WITH DIFFERENTIAL (CANCER CENTER ONLY)
BASO#: 0 10*3/uL (ref 0.0–0.2)
BASO%: 0 % (ref 0.0–2.0)
EOS%: 0.4 % (ref 0.0–7.0)
Eosinophils Absolute: 0 10*3/uL (ref 0.0–0.5)
HCT: 36.1 % (ref 34.8–46.6)
HEMOGLOBIN: 12.2 g/dL (ref 11.6–15.9)
LYMPH#: 1.7 10*3/uL (ref 0.9–3.3)
LYMPH%: 70.6 % — AB (ref 14.0–48.0)
MCH: 32.6 pg (ref 26.0–34.0)
MCHC: 33.8 g/dL (ref 32.0–36.0)
MCV: 97 fL (ref 81–101)
MONO#: 0.1 10*3/uL (ref 0.1–0.9)
MONO%: 5.9 % (ref 0.0–13.0)
NEUT#: 0.6 10*3/uL — ABNORMAL LOW (ref 1.5–6.5)
NEUT%: 23.1 % — ABNORMAL LOW (ref 39.6–80.0)
PLATELETS: 298 10*3/uL (ref 145–400)
RBC: 3.74 10*6/uL (ref 3.70–5.32)
RDW: 13.7 % (ref 11.1–15.7)
WBC: 2.4 10*3/uL — ABNORMAL LOW (ref 3.9–10.0)

## 2014-06-08 LAB — COMPREHENSIVE METABOLIC PANEL
ALT: 25 U/L (ref 0–35)
AST: 23 U/L (ref 0–37)
Albumin: 3.9 g/dL (ref 3.5–5.2)
Alkaline Phosphatase: 106 U/L (ref 39–117)
BUN: 9 mg/dL (ref 6–23)
CALCIUM: 8.7 mg/dL (ref 8.4–10.5)
CHLORIDE: 102 meq/L (ref 96–112)
CO2: 27 meq/L (ref 19–32)
CREATININE: 0.75 mg/dL (ref 0.50–1.10)
Glucose, Bld: 81 mg/dL (ref 70–99)
Potassium: 3.9 mEq/L (ref 3.5–5.3)
Sodium: 136 mEq/L (ref 135–145)
Total Bilirubin: 0.3 mg/dL (ref 0.2–1.2)
Total Protein: 6.4 g/dL (ref 6.0–8.3)

## 2014-06-08 NOTE — Progress Notes (Signed)
Hematology and Oncology Follow Up Visit  Nancy Parks [id number] [birth year] 64 y.o. 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  Nancy Parks is back for followup. We last saw her back in December. She had a good Christmas and a good winter.  She had cataract surgery a week or so ago. This is for her left eye. She is doing good with this.   She is working. She will be going to the beach over the weekend as she was ill to get a condominium down at [facility] that a client has an number is led her use as a bonus. She is really looking for to this. I told her to make sure that she wears sunscreen.  Her myeloma studies have trended upward slowly. Her last M spike in December was 0.8 g/dL. Her IgG level was 1310 mg/dL. Her lambda light chain was 1.16 mg/dL.  She's been totally asymptomatic. She has had no nausea or vomiting. There's been no change in bowel or bladder habits. She's had no rashes. She's had no leg swelling. She's had no cough. She had no fever.  Her performance status is ECOG 0.     Medications:  Current outpatient prescriptions:  .  amitriptyline (ELAVIL) 50 MG tablet, TAKE 1 TABLET(S) BY MOUTH EVERY NIGHT AT BEDTIME, Disp: 90 tablet, Rfl: 1 .  ketorolac (ACULAR) 0.4 % SOLN, Apply 1 drop to eye 4 (four) times daily. , Disp: , Rfl:  .  moxifloxacin (VIGAMOX) 0.5 % ophthalmic solution, Place 1 drop into the left eye 3 (three) times daily. , Disp: , Rfl:  .  NON FORMULARY, Take by mouth at bedtime. TRIPHALA, Disp: , Rfl:  .  prednisoLONE acetate (PRED FORTE) 1 % ophthalmic suspension, Place 1 drop into the left eye 4 (four) times daily. , Disp: , Rfl:  .  Specialty Vitamins Products (MAGNESIUM, AMINO ACID CHELATE,) 133 MG tablet, Take 1 tablet by mouth 2 (two) times daily. 1000 mg bid, Disp: , Rfl:  .  SUMAtriptan (IMITREX) 100 MG tablet,  Take 100 mg by mouth as needed for migraine., Disp: , Rfl:   Allergies:  Allergies  Allergen Reactions  . Latex Rash    REACTION: Rash REACTION: Rash REACTION: Rash  . Tape Rash    Past Medical History, Surgical history, Social history, and Family History were reviewed and updated.  Review of Systems: As above  Physical Exam:  height is 5\' 9"  (1.753 m) and weight is 181 lb (82.101 kg). Her oral temperature is 98.2 F (36.8 C). Her blood pressure is 124/71 and her pulse is 96. Her respiration is 14.   Lungs are clear. Cardiac exam regular rate and rhythm. She has no murmurs rubs or bruits. Abdomen is soft. She has good bowel sounds. There is no fluid wave. There is no palpable liver or spleen tip. Back exam no tenderness over the spine ribs or hips. Extremities shows no clubbing cyanosis or edema. Has good range of motion of her joints. Has good muscle strength. Skin exam shows no rashes ecchymosis or petechia. Neurological exam no focal neurological deficits. Head and neck exam shows no adenopathy in the neck. Thyroid is nonpalpable. No ocular or oral lesions are noted  Lab Results  Component Value Date   WBC 2.4* 06/08/2014   HGB 12.2 06/08/2014   HCT 36.1 06/08/2014  MCV 97 06/08/2014   PLT 298 06/08/2014     Chemistry      Component Value Date/Time   NA 144 01/19/2014 0908   NA 139 05/26/2013 1116   K 4.1 01/19/2014 0908   K 4.0 05/26/2013 1116   CL 104 01/19/2014 0908   CL 105 05/26/2013 1116   CO2 29 01/19/2014 0908   CO2 26 05/26/2013 1116   BUN 11 01/19/2014 0908   BUN 6 05/26/2013 1116   CREATININE 1.0 01/19/2014 0908   CREATININE 0.80 05/26/2013 1116      Component Value Date/Time   CALCIUM 9.1 01/19/2014 0908   CALCIUM 8.9 05/26/2013 1116   ALKPHOS 113* 01/19/2014 0908   ALKPHOS 124* 05/26/2013 1116   AST 24 01/19/2014 0908   AST 24 05/26/2013 1116   ALT 25 01/19/2014 0908   ALT 34 05/26/2013 1116   BILITOT 0.80 01/19/2014 0908   BILITOT 0.5  05/26/2013 1116       Impression and Plan: [name] is 64 year old white female. She has IgG lambda smoldering myeloma. She is looking quite good right now.   Again, her myeloma studies have trended upward slowly. She is asymptomatic.. We will just follow this along.  I looked at her blood smear. Her white cell count is down a little bit. I do not see any immature myeloid cells. She has decent neutrophils. She has mature appearing lymphocytes.  I think that we can get her back in another 4-5 months. She's done incredibly well. I don't see any need for any additional studies on her.   Nancy Napoleon, MD 4/21/20164:04 PM

## 2014-06-12 LAB — IGG, IGA, IGM
IGA: 62 mg/dL — AB (ref 69–380)
IgG (Immunoglobin G), Serum: 1230 mg/dL (ref 690–1700)
IgM, Serum: 91 mg/dL (ref 52–322)

## 2014-06-12 LAB — KAPPA/LAMBDA LIGHT CHAINS
Kappa free light chain: 1.21 mg/dL (ref 0.33–1.94)
Kappa:Lambda Ratio: 1.03 (ref 0.26–1.65)
LAMBDA FREE LGHT CHN: 1.18 mg/dL (ref 0.57–2.63)

## 2014-06-12 LAB — PROTEIN ELECTROPHORESIS, SERUM, WITH REFLEX
ABNORMAL PROTEIN BAND1: 0.6 g/dL
ALBUMIN ELP: 4 g/dL (ref 3.8–4.8)
ALPHA-2-GLOBULIN: 0.6 g/dL (ref 0.5–0.9)
Alpha-1-Globulin: 0.2 g/dL (ref 0.2–0.3)
BETA GLOBULIN: 0.3 g/dL — AB (ref 0.4–0.6)
Beta 2: 0.2 g/dL (ref 0.2–0.5)
GAMMA GLOBULIN: 1 g/dL (ref 0.8–1.7)
Total Protein, Serum Electrophoresis: 6.4 g/dL (ref 6.1–8.1)

## 2014-06-12 LAB — LACTATE DEHYDROGENASE: LDH: 136 U/L (ref 94–250)

## 2014-06-12 LAB — BETA 2 MICROGLOBULIN, SERUM: Beta-2 Microglobulin: 1.97 mg/L (ref <=2.51)

## 2014-06-12 LAB — IFE INTERPRETATION

## 2014-06-13 ENCOUNTER — Encounter: Payer: Self-pay | Admitting: *Deleted

## 2014-06-13 ENCOUNTER — Telehealth: Payer: Self-pay | Admitting: *Deleted

## 2014-06-13 NOTE — Telephone Encounter (Signed)
-----   Message from [name], MD sent at 06/12/2014  6:32 PM EDT ----- Call and let her know that the myeloma levels are very stable. Thanks

## 2014-08-28 ENCOUNTER — Ambulatory Visit (INDEPENDENT_AMBULATORY_CARE_PROVIDER_SITE_OTHER): Payer: 59

## 2014-08-28 ENCOUNTER — Telehealth: Payer: Self-pay | Admitting: Family Medicine

## 2014-08-28 ENCOUNTER — Encounter: Payer: Self-pay | Admitting: Family Medicine

## 2014-08-28 ENCOUNTER — Ambulatory Visit (INDEPENDENT_AMBULATORY_CARE_PROVIDER_SITE_OTHER): Payer: 59 | Admitting: Family Medicine

## 2014-08-28 VITALS — BP 129/79 | HR 91 | Ht 69.0 in | Wt 179.0 lb

## 2014-08-28 DIAGNOSIS — M4312 Spondylolisthesis, cervical region: Secondary | ICD-10-CM | POA: Diagnosis not present

## 2014-08-28 DIAGNOSIS — G43109 Migraine with aura, not intractable, without status migrainosus: Secondary | ICD-10-CM | POA: Diagnosis not present

## 2014-08-28 DIAGNOSIS — M542 Cervicalgia: Secondary | ICD-10-CM

## 2014-08-28 DIAGNOSIS — S161XXA Strain of muscle, fascia and tendon at neck level, initial encounter: Secondary | ICD-10-CM

## 2014-08-28 LAB — TSH: TSH: 1.458 u[IU]/mL (ref 0.350–4.500)

## 2014-08-28 LAB — T4, FREE: FREE T4: 0.99 ng/dL (ref 0.80–1.80)

## 2014-08-28 NOTE — Telephone Encounter (Signed)
I called the patient about her xray results. Plan for PT

## 2014-08-28 NOTE — Assessment & Plan Note (Signed)
Refer to Neurology

## 2014-08-28 NOTE — Progress Notes (Signed)
Nancy Parks is a 64 y.o. female who presents to [facility]  today for   1) Anterior neck swelling and pain. This problem has been ongoing for about 5 weeks. She has tried some chiropractor visits which have helped for a short while. She denies any trouble swallowing or breathing. No fever, chills, NVD or weight loss. Her multiple myleoma is well controlled. She has not tried any medicines. No radiating pain weakness or numbness.   2) Migraine Headaches. The patient has a history of atypical migraines with auora. She notes right temporal headache with confused speech and fogginess. This has been evaluated by neurology and she currently take amitriptyline and sumatriptan and ibuprofen. Her headaches have worsened recently for the last few weeks. She denies any permanent neurological defect. She would like to be referred back to her neurologist.    PMH:  Past Medical History  Diagnosis Date  . Headache(784.0)     Migraines  . Leukopenia   . Depression   . Multiple myeloma, without mention of having achieved remission 07/13/2012  . Leukopenia 07/13/2012     History  Substance Use Topics  . Smoking status: Never Smoker   . Smokeless tobacco: Never Used     Comment: never used tobacco  . Alcohol Use: No   ROS as above  Medications reviewed. Current Outpatient Prescriptions  Medication Sig Dispense Refill  . amitriptyline (ELAVIL) 50 MG tablet TAKE 1 TABLET(S) BY MOUTH EVERY NIGHT AT BEDTIME 90 tablet 1  . NON FORMULARY Take by mouth at bedtime. TRIPHALA    . Specialty Vitamins Products (MAGNESIUM, AMINO ACID CHELATE,) 133 MG tablet Take 1 tablet by mouth 2 (two) times daily. 1000 mg bid    . SUMAtriptan (IMITREX) 100 MG tablet Take 100 mg by mouth as needed for migraine.     No current facility-administered medications for this visit.    Exam:  BP 129/79 mmHg  Pulse 91  Ht 5' 9"  (1.753 m)  Wt 179 lb (81.194 kg)  BMI 26.42 kg/m2 Gen: Well  NAD HEENT: EOMI,  MMM, PERRLA Lungs: CTABL Nl WOB Heart: RRR no MRG Abd: NABS, NT, ND Exts: Non edematous BL  LE, warm and well perfused.  Neuro: AOx3. Normal coordination ,gait and strength.  Neck: Non-tender to midline. Normal ROM. UE strength intact BL.  No masses or swelling.

## 2014-08-28 NOTE — Patient Instructions (Signed)
Please return in about 1 month. I will order PT.  Return to Neurology.  Come back sooner if needed.

## 2014-08-28 NOTE — Assessment & Plan Note (Signed)
Likely myofascial.  Check TSH and Free T4 Obtain Cervical spine xray.  If no multiple myeloma involvement will proceed with PT.

## 2014-08-31 ENCOUNTER — Ambulatory Visit: Payer: 59 | Attending: Family Medicine | Admitting: Physical Therapy

## 2014-08-31 DIAGNOSIS — M436 Torticollis: Secondary | ICD-10-CM | POA: Diagnosis present

## 2014-08-31 DIAGNOSIS — M542 Cervicalgia: Secondary | ICD-10-CM | POA: Insufficient documentation

## 2014-08-31 NOTE — Therapy (Addendum)
[facility] [address]  [place], [place], [address] Phone: [phone]   Fax:  [phone]  Physical Therapy Evaluation  Patient Details  Name: Nancy Parks MRN: [id number] Date of Birth: 01/13/1951 Referring Provider:  Gregor Hams, MD  Encounter Date: 08/31/2014      PT End of Session - 08/31/14 0911    Visit Number 1   Number of Visits 16   Date for PT Re-Evaluation 10/26/14   PT Start Time 0807   PT Stop Time 0856   PT Time Calculation (min) 49 min   Activity Tolerance Patient tolerated treatment well   Behavior During Therapy [facility] for tasks assessed/performed      Past Medical History  Diagnosis Date  . Headache(784.0)     Migraines  . Leukopenia   . Depression   . Multiple myeloma, without mention of having achieved remission 07/13/2012  . Leukopenia 07/13/2012    Past Surgical History  Procedure Laterality Date  . Laparoscopic endometriosis fulguration  1979  . Cataract extraction  09/2007  . Basal cell carcinoma excision  2013    removed from nose    There were no vitals filed for this visit.  Visit Diagnosis:  Cervical pain (neck) - Plan: PT plan of care cert/re-cert  Stiffness of cervical spine - Plan: PT plan of care cert/re-cert      Subjective Assessment - 08/31/14 0811    Subjective Patient presents with c/o neck pain with tender points in bilateral suboccipals. Patient states pain worsens as the day progresses and neck starts to swell late in the day with increased stiffness in lateral neck. Saw a chiropracter who was able to give short lived relief but pain would be back within 1 day. Reports diffiucltyfinding comfortable sleeping position with pillow. Reports increased incidience of migraines since June 22, at times daily.   Pertinent History h/o migraines (usually 1 every 3-4 months)   Limitations Other (comment);Sitting  sleeping ("more tossing and turning" but sleep time not  limited)   How long can you sit comfortably? 45 minutes   Patient Stated Goals Know how to exercise to maintain flexibility in the neck   Currently in Pain? Yes   Pain Score 0-No pain  Least 0/10, Avg 8/10, Worst 9/10   Pain Location Neck   Pain Orientation Posterior  Left > right   Pain Descriptors / Indicators Dull;Sharp  starts as dull pain, progressing to sharp   Pain Radiating Towards radiates from neck to posterior skull   Pain Onset More than a month ago   Pain Frequency Intermittent  at least daily   Aggravating Factors  Left neck rotation, pressure from pillow, looking overhead   Pain Relieving Factors Essential oils, curcumin; Ibuprofen for migraine   Effect of Pain on Daily Activities Increased sleep disturbance, Increased need for breaks while working   Multiple Pain Sites No            [facility] PT Assessment - 08/31/14 0001    Assessment   Medical Diagnosis Cervical strain   Onset Date/Surgical Date 07/01/14   Hand Dominance Right   Next MD Visit 30 days   Prior Therapy chiropracter   Balance Screen   Has the patient fallen in the past 6 months No   Has the patient had a decrease in activity level because of a fear of falling?  No   Is the patient reluctant to leave their home because of a fear of  falling?  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   Leisure water aerobics 2x/wk   Observation/Other Assessments   Focus on Therapeutic Outcomes (FOTO)  51% (49% limited), predicted 65% (35% limited)   Posture/Postural Control   Posture/Postural Control Postural limitations   Postural Limitations Forward head;Rounded Shoulders   Posture Comments L shoulder elevated relative to right   ROM / Strength   AROM / PROM / Strength AROM   AROM   Overall AROM Comments Bilateral shoulder ROM WFL/WNL   AROM Assessment Site Cervical   Right/Left Shoulder --    Cervical Flexion 34   Cervical Extension 35   Cervical - Right Side Bend 18   Cervical - Left Side Bend 21   Cervical - Right Rotation 44   Cervical - Left Rotation 45   Palpation   Palpation comment Tender to palpation in suboccipitals; tightness in left SCM and uppper trapezius               [facility] Adult PT Treatment/Exercise - 08/31/14 0001    Neck Exercises: Seated   Shoulder Shrugs 10 reps   Shoulder Shrugs Limitations emphasis on shoulder depression   Shoulder Rolls Forwards;Backwards;10 reps   Other Seated Exercise scapular retraction, 10 reps (no resistance)   Neck Exercises: Supine   Neck Retraction 10 reps;3 secs                PT Education - 08/31/14 0912    Education provided Yes   Education Details Postural exercise HEP   Person(s) Educated Patient   Methods Explanation;Demonstration;Verbal cues;Handout   Comprehension Verbalized understanding;Returned demonstration;Verbal cues required;Need further instruction          PT Short Term Goals - 08/31/14 0932    PT SHORT TERM GOAL #1   Title Indepenent with initial HEP (09/28/14)   Time 4   Period Weeks   Status New           PT Long Term Goals - 08/31/14 0932    PT LONG TERM GOAL #1   Title Independent with advanced HEP (10/26/14)   Time 8   Period Weeks   Status New   PT LONG TERM GOAL #2   Title Cervical ROM to >/= 75% of normal range without pain (10/26/14)   Time 8   Period Weeks   Status New   PT LONG TERM GOAL #3   Title Patient without sleep disturbances related to cervical/neck pain (10/26/14)   Time 8   Period Weeks   Status New   PT LONG TERM GOAL #4   Title Patient able to perform overhead activities without increased neck pain   Time 8   Period Weeks   Status New               Plan - 08/31/14 0913    Clinical Impression Statement Patient referred for OPPT for cervical strain and spasm including SCM. Patient presents with decreased postural alignment with forward  head and rounded shoulders with left shoulder elevated relative to right. Cervical ROM limited up to 50% of normal range with greatest restirction in lateral flexion bilaterally. Increased tension noted in paraspinals and superior shoudler muscles most notably in suboccipitals, L SCM and L upper trapezius.presents with c/o neck pain with tender points in bilateral suboccipals. Patient localizes pain primarily to suboccipitals and L SCM with pain worsening as  the day progresses and associated with increased edema late in the day. Pain limits ability to find comfortable sleeping position with pillow, tolerance and for sitting in front of computer for job requirement, and activities requiring turning head (left>right) or looking overhead. Increased incidience of migraines reported since June 22, at times daily.   Pt will benefit from skilled therapeutic intervention in order to improve on the following deficits Pain;Decreased range of motion;Impaired flexibility;Increased muscle spasms;Postural dysfunction;Improper body mechanics;Hypomobility;Impaired perceived functional ability;Decreased activity tolerance;Decreased mobility;Decreased endurance   Rehab Potential Good   PT Frequency 2x / week   PT Duration 8 weeks   PT Treatment/Interventions Cryotherapy;Electrical Stimulation;Moist Heat;Traction;Ultrasound;Functional mobility training;Therapeutic activities;Therapeutic exercise;Neuromuscular re-education;Manual techniques;Passive range of motion;Taping;Patient/family education   PT Next Visit Plan Review of HEP, progression to gentle stretching, postural training   Consulted and Agree with Plan of Care Patient         Problem List Patient Active Problem List   Diagnosis Date Noted  . Neck pain 08/28/2014  . Multiple myeloma 07/13/2012  . Leukopenia 07/13/2012  . Right lateral epicondylitis 01/09/2012  . Major depressive disorder, recurrent episode, moderate 10/02/2011  . Depression, acute  02/04/2011  . OSTEOPOROSIS 01/08/2009  . Migraine 12/29/2008    Percival Spanish, PT, MPT 08/31/2014, 9:44 AM  [facility] [address]  [place], [place], [address] Phone: [phone]   Fax:  [phone]

## 2014-09-05 ENCOUNTER — Ambulatory Visit: Payer: 59 | Admitting: Rehabilitation

## 2014-09-06 ENCOUNTER — Ambulatory Visit: Payer: 59 | Admitting: Rehabilitation

## 2014-09-06 DIAGNOSIS — M436 Torticollis: Secondary | ICD-10-CM

## 2014-09-06 DIAGNOSIS — M542 Cervicalgia: Secondary | ICD-10-CM

## 2014-09-06 NOTE — Therapy (Signed)
[facility] [address]  [place], [place], [address] Phone: [phone]   Fax:  [phone]  Physical Therapy Treatment  Patient Details  Name: Nancy Parks MRN: [id number] Date of Birth: August 25, 1950 Referring Provider:  Gregor Hams, MD  Encounter Date: 09/06/2014      PT End of Session - 09/06/14 0938    Visit Number 2   Number of Visits 16   Date for PT Re-Evaluation 10/26/14   PT Start Time 0936   PT Stop Time 6168   PT Time Calculation (min) 38 min      Past Medical History  Diagnosis Date  . Headache(784.0)     Migraines  . Leukopenia   . Depression   . Multiple myeloma, without mention of having achieved remission 07/13/2012  . Leukopenia 07/13/2012    Past Surgical History  Procedure Laterality Date  . Laparoscopic endometriosis fulguration  1979  . Cataract extraction  09/2007  . Basal cell carcinoma excision  2013    removed from nose    There were no vitals filed for this visit.  Visit Diagnosis:  Cervical pain (neck)  Stiffness of cervical spine      Subjective Assessment - 09/06/14 0938    Subjective Denies pain but does have a migraine today.    Currently in Pain? No/denies                         [facility] Adult PT Treatment/Exercise - 09/06/14 0001    Exercises   Exercises Neck   Neck Exercises: Machines for Strengthening   UBE (Upper Arm Bike) level 1.0 3'/3' fwd/bwd    Neck Exercises: Theraband   Rows 15 reps;Green   Rows Limitations standing   Shoulder External Rotation 10 reps;Green   Shoulder External Rotation Limitations supine   Horizontal ABduction 10 reps;Green   Horizontal ABduction Limitations supine   Neck Exercises: Supine   Neck Retraction 10 reps;3 secs   Manual Therapy   Manual Therapy Soft tissue mobilization   Soft tissue mobilization to bilateral (Lt>Rt) upper trap, suboccipitals, SCM, levator, c-spine parapsinals, SOR   Neck Exercises:  Stretches   Upper Trapezius Stretch 2 reps;20 seconds   Upper Trapezius Stretch Limitations manually   Levator Stretch 2 reps;20 seconds   Levator Stretch Limitations manually   Other Neck Stretches SCM 2 sets 20 seconds, manually                PT Education - 09/06/14 1014    Education provided Yes   Education Details HEP   Person(s) Educated Patient   Methods Demonstration;Explanation;Handout   Comprehension Verbalized understanding;Returned demonstration          PT Short Term Goals - 08/31/14 0932    PT SHORT TERM GOAL #1   Title Indepenent with initial HEP (09/28/14)   Time 4   Period Weeks   Status New           PT Long Term Goals - 08/31/14 3729    PT LONG TERM GOAL #1   Title Independent with advanced HEP (10/26/14)   Time 8   Period Weeks   Status New   PT LONG TERM GOAL #2   Title Cervical ROM to >/= 75% of normal range without pain (10/26/14)   Time 8   Period Weeks   Status New   PT LONG TERM GOAL #3   Title Patient without sleep disturbances related  to cervical/neck pain (10/26/14)   Time 8   Period Weeks   Status New   PT LONG TERM GOAL #4   Title Patient able to perform overhead activities without increased neck pain   Time 8   Period Weeks   Status New               Plan - 09/06/14 1015    Clinical Impression Statement Focused on manual work today due to pt having migrane when she came in. Pt noted good response to manual work and no increase in pain with exercises. Gave pt upper trap, levator and SCM stretches for home. Did not have a picture for SCM but instructed pt how to perform.     PT Next Visit Plan Continue posture exercises and cervical stretching   Consulted and Agree with Plan of Care Patient        Problem List Patient Active Problem List   Diagnosis Date Noted  . Neck pain 08/28/2014  . Multiple myeloma 07/13/2012  . Leukopenia 07/13/2012  . Right lateral epicondylitis 01/09/2012  . Major depressive  disorder, recurrent episode, moderate 10/02/2011  . Depression, acute 02/04/2011  . OSTEOPOROSIS 01/08/2009  . Migraine 12/29/2008    Barbette Hair, PTA 09/06/2014, 10:17 AM  [facility] [address]  [place], [place], [address] Phone: [phone]   Fax:  [phone]

## 2014-09-07 ENCOUNTER — Ambulatory Visit: Payer: 59 | Admitting: Physical Therapy

## 2014-09-07 DIAGNOSIS — M542 Cervicalgia: Secondary | ICD-10-CM | POA: Diagnosis not present

## 2014-09-07 DIAGNOSIS — M436 Torticollis: Secondary | ICD-10-CM

## 2014-09-07 NOTE — Therapy (Signed)
[facility] [address]  [place], [place], [address] Phone: [phone]   Fax:  [phone]  Physical Therapy Treatment  Patient Details  Name: Nancy Parks MRN: [id number] Date of Birth: March 01, 1950 Referring Provider:  Gregor Hams, MD  Encounter Date: 09/07/2014      PT End of Session - 09/07/14 0807    Visit Number 3   Number of Visits 16   PT Start Time 0759   PT Stop Time 0844   PT Time Calculation (min) 45 min   Activity Tolerance Patient tolerated treatment well   Behavior During Therapy [facility] for tasks assessed/performed      Past Medical History  Diagnosis Date  . Headache(784.0)     Migraines  . Leukopenia   . Depression   . Multiple myeloma, without mention of having achieved remission 07/13/2012  . Leukopenia 07/13/2012    Past Surgical History  Procedure Laterality Date  . Laparoscopic endometriosis fulguration  1979  . Cataract extraction  09/2007  . Basal cell carcinoma excision  2013    removed from nose    There were no vitals filed for this visit.  Visit Diagnosis:  Cervical pain (neck)  Stiffness of cervical spine      Subjective Assessment - 09/07/14 0804    Subjective Patient reports completing HEP along with yoga moves already this morning.   Currently in Pain? Yes   Pain Score 1    Pain Location Neck   Pain Orientation Posterior   Pain Descriptors / Indicators Dull   Pain Frequency Intermittent   Multiple Pain Sites No                 [facility] Adult PT Treatment/Exercise - 09/07/14 0808    Exercises   Exercises Neck   Neck Exercises: Machines for Strengthening   UBE (Upper Arm Bike) level 1.0 3'/3' fwd/bwd    Neck Exercises: Theraband   Rows 15 reps;Green   Rows Limitations standing   Shoulder External Rotation 10 reps;Green   Shoulder External Rotation Limitations supine   Horizontal ABduction 10 reps;Green   Horizontal ABduction Limitations supine on 1/2  foam roll   Neck Exercises: Seated   Neck Retraction 15 reps;3 secs   Shoulder Shrugs 15 reps   Shoulder Rolls Backwards;Forwards;15 reps   Neck Exercises: Supine   X to V 10 reps   X to V Weights (lbs) 0#   X to V Limitations on 1/2 foam roll   Manual Therapy   Manual Therapy Soft tissue mobilization   Soft tissue mobilization to bilateral (Lt>Rt) upper trap, suboccipitals, SCM, levator, c-spine parapsinals, SOR   Neck Exercises: Stretches   Upper Trapezius Stretch 2 reps;30 seconds   Upper Trapezius Stretch Limitations manually   Levator Stretch 2 reps;30 seconds   Levator Stretch Limitations manually   Chest Stretch --  2 minutes   Chest Stretch Limitations arms abducted to 90 degrees on half foam roll   Other Neck Stretches SCM 2 sets 30 seconds, manually                PT Education - 09/06/14 1014    Education provided Yes   Education Details HEP   Person(s) Educated Patient   Methods Demonstration;Explanation;Handout   Comprehension Verbalized understanding;Returned demonstration          PT Short Term Goals - 09/07/14 0848    PT SHORT TERM GOAL #1   Title Indepenent with initial HEP (  09/28/14)   Status On-going           PT Long Term Goals - 09/07/14 0849    PT LONG TERM GOAL #1   Title Independent with advanced HEP (10/26/14)   Status On-going   PT LONG TERM GOAL #2   Title Cervical ROM to >/= 75% of normal range without pain (10/26/14)   Status On-going   PT LONG TERM GOAL #3   Title Patient without sleep disturbances related to cervical/neck pain (10/26/14)   Status On-going   PT LONG TERM GOAL #4   Title Patient able to perform overhead activities without increased neck pain   Status On-going               Plan - 09/07/14 2393    Clinical Impression Statement Reviewed HEP and corrected form and pacing with exercises and stretches to better isolate desired movements. Manual therapy including STM and stretching with manual overpressure  for SCM, upper trap, levator and suboccipitals with improved posture (decreased left shoulder elevation) noted at end of session.   PT Next Visit Plan posture exercises and cervical stretching, manual therapy, modalities PRN   Consulted and Agree with Plan of Care Patient        Problem List Patient Active Problem List   Diagnosis Date Noted  . Neck pain 08/28/2014  . Multiple myeloma 07/13/2012  . Leukopenia 07/13/2012  . Right lateral epicondylitis 01/09/2012  . Major depressive disorder, recurrent episode, moderate 10/02/2011  . Depression, acute 02/04/2011  . OSTEOPOROSIS 01/08/2009  . Migraine 12/29/2008    Percival Spanish, PT, MPT 09/07/2014, 8:53 AM  [facility] [address]  [place], [place], [address] Phone: [phone]   Fax:  [phone]

## 2014-09-11 ENCOUNTER — Ambulatory Visit: Payer: 59 | Admitting: Physical Therapy

## 2014-09-11 DIAGNOSIS — M542 Cervicalgia: Secondary | ICD-10-CM | POA: Diagnosis not present

## 2014-09-11 DIAGNOSIS — M436 Torticollis: Secondary | ICD-10-CM

## 2014-09-11 NOTE — Therapy (Signed)
[facility] [address]  [place], [place], [address] Phone: [phone]   Fax:  [phone]  Physical Therapy Treatment  Patient Details  Name: Nancy Parks MRN: [id number] Date of Birth: 20-Jan-1951 Referring Provider:  Gregor Hams, MD  Encounter Date: 09/11/2014      PT End of Session - 09/11/14 0806    Visit Number 4   Number of Visits 16   Date for PT Re-Evaluation 10/26/14   PT Start Time 0801   PT Stop Time 0843   PT Time Calculation (min) 42 min   Activity Tolerance Patient tolerated treatment well   Behavior During Therapy [facility] for tasks assessed/performed      Past Medical History  Diagnosis Date  . Headache(784.0)     Migraines  . Leukopenia   . Depression   . Multiple myeloma, without mention of having achieved remission 07/13/2012  . Leukopenia 07/13/2012    Past Surgical History  Procedure Laterality Date  . Laparoscopic endometriosis fulguration  1979  . Cataract extraction  09/2007  . Basal cell carcinoma excision  2013    removed from nose    There were no vitals filed for this visit.  Visit Diagnosis:  Cervical pain (neck)  Stiffness of cervical spine      Subjective Assessment - 09/11/14 0804    Subjective Patient states the stretching and the curcumin are really helping with the pain. "I'm much better than the last few times you saw me."   Currently in Pain? No/denies            [facility] PT Assessment - 09/11/14 0839    AROM   Overall AROM Comments Bilateral shoulder ROM WFL/WNL   AROM Assessment Site Cervical   Cervical Flexion 38   Cervical Extension 52   Cervical - Right Side Bend 22   Cervical - Left Side Bend 24   Cervical - Right Rotation 60   Cervical - Left Rotation 52                     [facility] Adult PT Treatment/Exercise - 09/11/14 0806    Exercises   Exercises Neck   Neck Exercises: Machines for Strengthening   UBE (Upper Arm Bike) level 1.5  3'/3' fwd/bwd    Neck Exercises: Theraband   Rows 15 reps;Green   Rows Limitations standing   Shoulder External Rotation 15 reps;Green   Shoulder External Rotation Limitations supine on 1/2 foam roll   Horizontal ABduction 15 reps;Green   Horizontal ABduction Limitations supine on 1/2 foam roll   Neck Exercises: Standing   Wall Push Ups 10 reps   Neck Exercises: Supine   X to V 10 reps;Weight   X to V Weights (lbs) 1#   X to V Limitations on 1/2 foam roll   Shoulder Flexion Both;10 reps;Weights   Shoulder Flexion Weights (lbs) 2#   Shoulder Flexion Limitations pullover on 1/2 foam roll   Manual Therapy   Manual Therapy Soft tissue mobilization   Soft tissue mobilization to bilateral (Lt>Rt) upper trap, suboccipitals, SCM, levator, c-spine parapsinals, SOR   Neck Exercises: Stretches   Corner Stretch 20 seconds;3 reps   Chest Stretch --  2 minutes   Chest Stretch Limitations arms abducted to 90 degrees on half foam roll                  PT Short Term Goals - 09/07/14 0848    PT  SHORT TERM GOAL #1   Title Indepenent with initial HEP (09/28/14)   Status On-going           PT Long Term Goals - 09/07/14 0849    PT LONG TERM GOAL #1   Title Independent with advanced HEP (10/26/14)   Status On-going   PT LONG TERM GOAL #2   Title Cervical ROM to >/= 75% of normal range without pain (10/26/14)   Status On-going   PT LONG TERM GOAL #3   Title Patient without sleep disturbances related to cervical/neck pain (10/26/14)   Status On-going   PT LONG TERM GOAL #4   Title Patient able to perform overhead activities without increased neck pain   Status On-going               Plan - 09/11/14 0165    Clinical Impression Statement Patient with improving posture and postural awareness both in therapy and reported when working on computer at home. Pain reduced with decreasing sleep interference reported by patient.   PT Next Visit Plan posture exercises and cervical  stretching, ROM/strengthening exercises, postural training, manual therapy, modalities PRN   Consulted and Agree with Plan of Care Patient        Problem List Patient Active Problem List   Diagnosis Date Noted  . Neck pain 08/28/2014  . Multiple myeloma 07/13/2012  . Leukopenia 07/13/2012  . Right lateral epicondylitis 01/09/2012  . Major depressive disorder, recurrent episode, moderate 10/02/2011  . Depression, acute 02/04/2011  . OSTEOPOROSIS 01/08/2009  . Migraine 12/29/2008    Percival Spanish, PT, MPT 09/11/2014, 8:47 AM  [facility] [address]  [place], [place], [address] Phone: [phone]   Fax:  [phone]

## 2014-09-13 ENCOUNTER — Ambulatory Visit: Payer: 59 | Admitting: Rehabilitation

## 2014-09-13 DIAGNOSIS — M542 Cervicalgia: Secondary | ICD-10-CM | POA: Diagnosis not present

## 2014-09-13 DIAGNOSIS — M436 Torticollis: Secondary | ICD-10-CM

## 2014-09-13 NOTE — Therapy (Signed)
[facility] [address]  [place], [place], [address] Phone: [phone]   Fax:  [phone]  Physical Therapy Treatment  Patient Details  Name: Nancy Parks MRN: [id number] Date of Birth: 29-Jun-1950 Referring Provider:  Gregor Hams, MD  Encounter Date: 09/13/2014      PT End of Session - 09/13/14 0804    Visit Number 5   Number of Visits 16   Date for PT Re-Evaluation 10/26/14   PT Start Time 0802   PT Stop Time 0845   PT Time Calculation (min) 43 min   Activity Tolerance Patient tolerated treatment well   Behavior During Therapy [facility] for tasks assessed/performed      Past Medical History  Diagnosis Date  . Headache(784.0)     Migraines  . Leukopenia   . Depression   . Multiple myeloma, without mention of having achieved remission 07/13/2012  . Leukopenia 07/13/2012    Past Surgical History  Procedure Laterality Date  . Laparoscopic endometriosis fulguration  1979  . Cataract extraction  09/2007  . Basal cell carcinoma excision  2013    removed from nose    There were no vitals filed for this visit.  Visit Diagnosis:  Cervical pain (neck)  Stiffness of cervical spine      Subjective Assessment - 09/13/14 0803    Subjective States her pain keeps getting better and better and is very pleased with her progress.    Currently in Pain? No/denies                         [facility] Adult PT Treatment/Exercise - 09/13/14 0805    Exercises   Exercises Neck   Neck Exercises: Machines for Strengthening   UBE (Upper Arm Bike) level 2.0 3'/3' fwd/bwd   Neck Exercises: Theraband   Shoulder Extension 15 reps;Green   Shoulder Extension Limitations standing   Rows 15 reps;Green   Rows Limitations standing   Horizontal ABduction 15 reps;Green   Horizontal ABduction Limitations supine on 1/2 foam roll   Neck Exercises: Supine   X to V 10 reps;Weight   X to V Weights (lbs) 1#   X to V  Limitations on 1/2 foam roll   Shoulder Flexion Both;Weights;15 reps   Shoulder Flexion Weights (lbs) 2#   Shoulder Flexion Limitations pullover on 1/2 foam roll   Shoulder ABduction 10 reps;Both   Shoulder Abduction Weights (lbs) 1   Shoulder Abduction Limitations Alternating horizontal Abduction on 1/2 foam roll   Manual Therapy   Manual Therapy Soft tissue mobilization   Soft tissue mobilization to bilateral (Lt>Rt) upper trap, suboccipitals, SCM, levator, c-spine parapsinals, SOR   Neck Exercises: Stretches   Corner Stretch 20 seconds;3 reps   Chest Stretch --  2 minutes   Chest Stretch Limitations arms abducted to 90 degrees on half foam roll                  PT Short Term Goals - 09/07/14 0848    PT SHORT TERM GOAL #1   Title Indepenent with initial HEP (09/28/14)   Status On-going           PT Long Term Goals - 09/07/14 0849    PT LONG TERM GOAL #1   Title Independent with advanced HEP (10/26/14)   Status On-going   PT LONG TERM GOAL #2   Title Cervical ROM to >/= 75% of normal range without pain (10/26/14)  Status On-going   PT LONG TERM GOAL #3   Title Patient without sleep disturbances related to cervical/neck pain (10/26/14)   Status On-going   PT LONG TERM GOAL #4   Title Patient able to perform overhead activities without increased neck pain   Status On-going               Plan - 09/13/14 0849    Clinical Impression Statement Excellent tolerance to exercises today as well as new horizontal abduction exercise. Pt seems to be pleased with current progress and complaint with HEP.    PT Next Visit Plan posture exercises and cervical stretching, ROM/strengthening exercises, postural training, manual therapy, modalities PRN   Consulted and Agree with Plan of Care Patient        Problem List Patient Active Problem List   Diagnosis Date Noted  . Neck pain 08/28/2014  . Multiple myeloma 07/13/2012  . Leukopenia 07/13/2012  . Right lateral  epicondylitis 01/09/2012  . Major depressive disorder, recurrent episode, moderate 10/02/2011  . Depression, acute 02/04/2011  . OSTEOPOROSIS 01/08/2009  . Migraine 12/29/2008    Barbette Hair, PTA 09/13/2014, 8:50 AM  [facility] [address]  [place], [place], [address] Phone: [phone]   Fax:  [phone]

## 2014-09-18 ENCOUNTER — Ambulatory Visit: Payer: 59 | Attending: Family Medicine | Admitting: Physical Therapy

## 2014-09-18 DIAGNOSIS — M542 Cervicalgia: Secondary | ICD-10-CM | POA: Insufficient documentation

## 2014-09-18 DIAGNOSIS — M436 Torticollis: Secondary | ICD-10-CM

## 2014-09-18 NOTE — Therapy (Signed)
[facility] [address]  [place], [place], [address] Phone: [phone]   Fax:  [phone]  Physical Therapy Treatment  Patient Details  Name: Nancy Parks MRN: [id number] Date of Birth: October 08, 1950 Referring Provider:  Gregor Hams, MD  Encounter Date: 09/18/2014      PT End of Session - 09/18/14 0803    Visit Number 6   Number of Visits 16   Date for PT Re-Evaluation 10/26/14   PT Start Time 0800   PT Stop Time 0842   PT Time Calculation (min) 42 min      Past Medical History  Diagnosis Date  . Headache(784.0)     Migraines  . Leukopenia   . Depression   . Multiple myeloma, without mention of having achieved remission 07/13/2012  . Leukopenia 07/13/2012    Past Surgical History  Procedure Laterality Date  . Laparoscopic endometriosis fulguration  1979  . Cataract extraction  09/2007  . Basal cell carcinoma excision  2013    removed from nose    There were no vitals filed for this visit.  Visit Diagnosis:  Cervical pain (neck)  Stiffness of cervical spine      Subjective Assessment - 09/18/14 0801    Subjective Patient states she "feels things are gettng looser, and not as tight."   Currently in Pain? Yes   Pain Score 4   3-4/10   Pain Location Neck   Pain Orientation Posterior;Proximal;Left;Distal   Multiple Pain Sites No            [facility] PT Assessment - 09/18/14 0001    AROM   Overall AROM Comments Bilateral shoulder ROM WFL/WNL   AROM Assessment Site Cervical   Cervical Flexion 40   Cervical Extension 51   Cervical - Right Side Bend 24   Cervical - Left Side Bend 23   Cervical - Right Rotation 62   Cervical - Left Rotation 60                [facility] Adult PT Treatment/Exercise - 09/18/14 0803    Exercises   Exercises Neck   Neck Exercises: Machines for Strengthening   UBE (Upper Arm Bike) level 2.0 3'/3' fwd/bwd   Cybex Row Low row 20# x 10   Neck Exercises:  Theraband   Shoulder Extension 15 reps;Green   Shoulder Extension Limitations standing   Rows 15 reps;Green   Rows Limitations standing   Shoulder External Rotation 10 reps;Green   Shoulder External Rotation Limitations standing   Horizontal ABduction 15 reps;Green   Horizontal ABduction Limitations hooklying on 1/2 foam roll   Neck Exercises: Standing   Wall Push Ups 10 reps   Neck Exercises: Seated   W Back 10 reps   W Back Weights (lbs) 0   Neck Exercises: Supine   X to V 15 reps;Weight   X to V Weights (lbs) 1#   X to V Limitations on 1/2 foam roll   Shoulder Flexion Both;Weights;15 reps   Shoulder Flexion Weights (lbs) 3#   Shoulder Flexion Limitations pullover on 1/2 foam roll   Shoulder ABduction 15 reps;Both   Shoulder Abduction Weights (lbs) 1#   Shoulder Abduction Limitations Alternating Horizontal Abd, hooklying on 1/2 foam roll   Manual Therapy   Manual Therapy Soft tissue mobilization   Soft tissue mobilization to bilateral (Lt>Rt) upper trap, suboccipitals, SCM, levator, c-spine parapsinals, SOR   Neck Exercises: Stretches   Corner Stretch 20 seconds;5 reps  Chest Stretch --  2 minutes   Chest Stretch Limitations arms abducted to 90 degrees on half foam roll                  PT Short Term Goals - 09/18/14 0842    PT SHORT TERM GOAL #1   Title Indepenent with initial HEP (09/28/14)   Status Achieved           PT Long Term Goals - 09/18/14 9643    PT LONG TERM GOAL #1   Title Independent with advanced HEP (10/26/14)   Status On-going   PT LONG TERM GOAL #2   Title Cervical ROM to >/= 75% of normal range without pain (10/26/14)   Status On-going   PT LONG TERM GOAL #3   Title Patient without sleep disturbances related to cervical/neck pain (10/26/14)   Status On-going   PT LONG TERM GOAL #4   Title Patient able to perform overhead activities without increased neck pain   Status On-going               Plan - 09/18/14 0836    Clinical  Impression Statement Continued good compliance with HEP and tolerance for progression of therapeutic exercises/activities resulting in improving ROM with decreased pain.   PT Next Visit Plan posture exercises and cervical stretching, ROM/strengthening exercises, postural training, manual therapy, modalities PRN   Consulted and Agree with Plan of Care Patient        Problem List Patient Active Problem List   Diagnosis Date Noted  . Neck pain 08/28/2014  . Multiple myeloma 07/13/2012  . Leukopenia 07/13/2012  . Right lateral epicondylitis 01/09/2012  . Major depressive disorder, recurrent episode, moderate 10/02/2011  . Depression, acute 02/04/2011  . OSTEOPOROSIS 01/08/2009  . Migraine 12/29/2008    Percival Spanish, PT, MPT 09/18/2014, 8:44 AM  [facility] [address]  [place], [place], [address] Phone: [phone]   Fax:  [phone]

## 2014-09-20 ENCOUNTER — Ambulatory Visit: Payer: 59 | Admitting: Rehabilitation

## 2014-09-20 DIAGNOSIS — M436 Torticollis: Secondary | ICD-10-CM

## 2014-09-20 DIAGNOSIS — M542 Cervicalgia: Secondary | ICD-10-CM | POA: Diagnosis not present

## 2014-09-20 NOTE — Therapy (Signed)
[facility] [address]  [place], [place], [address] Phone: [phone]   Fax:  [phone]  Physical Therapy Treatment  Patient Details  Name: Nancy Parks MRN: [id number] Date of Birth: 1950-06-03 Referring Provider:  Gregor Hams, MD  Encounter Date: 09/20/2014      PT End of Session - 09/20/14 0758    Visit Number 7   Number of Visits 16   Date for PT Re-Evaluation 10/26/14   PT Start Time 0758   PT Stop Time 8768   PT Time Calculation (min) 56 min      Past Medical History  Diagnosis Date  . Headache(784.0)     Migraines  . Leukopenia   . Depression   . Multiple myeloma, without mention of having achieved remission 07/13/2012  . Leukopenia 07/13/2012    Past Surgical History  Procedure Laterality Date  . Laparoscopic endometriosis fulguration  1979  . Cataract extraction  09/2007  . Basal cell carcinoma excision  2013    removed from nose    There were no vitals filed for this visit.  Visit Diagnosis:  Cervical pain (neck)  Stiffness of cervical spine      Subjective Assessment - 09/20/14 0800    Subjective "Today isn't one of my good days." Notes pain around a 4-5/10 today, unknown reason. Still reports benefit of HEP and states she performs it 2-4 times a day. Did go see the chiropractor but forgot to bring his notes in. Thinks she might start going back to water aerobics 2 times a week.    Currently in Pain? Yes   Pain Score --  4-5/10   Pain Location Neck   Pain Orientation Posterior;Left                         [facility] Adult PT Treatment/Exercise - 09/20/14 0800    Neck Exercises: Machines for Strengthening   UBE (Upper Arm Bike) level 2.5 3'/3' fwd/bwd   Cybex Row Low row 20# x 10   Neck Exercises: Theraband   Shoulder External Rotation 15 reps;Green   Shoulder External Rotation Limitations bilateral ER, supine on 1/2 foam roll   Horizontal ABduction 15 reps;Green    Horizontal ABduction Limitations supine on 1/2 foam roll   Neck Exercises: Standing   Wall Push Ups 15 reps   Neck Exercises: Supine   X to V 15 reps;Weight   X to V Weights (lbs) 1#   X to V Limitations on 1/2 foam roll   Shoulder Flexion Both;Weights;15 reps   Shoulder Flexion Weights (lbs) 4#   Shoulder Flexion Limitations pullover on 1/2 foam roll   Shoulder ABduction 15 reps;Both   Shoulder Abduction Weights (lbs) 1#   Shoulder Abduction Limitations Alternating horizontal Abduction on 1/2 foam roll   Modalities   Modalities Traction   Traction   Type of Traction Cervical   Min (lbs) 5   Max (lbs) 10   Hold Time 60   Rest Time 20   Time 15' at a 10 degree pull   Manual Therapy   Manual Therapy Soft tissue mobilization   Soft tissue mobilization to bilateral (Lt>Rt) upper trap, suboccipitals, SCM, levator, c-spine parapsinals, SOR   Neck Exercises: Stretches   Corner Stretch 20 seconds;5 reps   Chest Stretch --  2 minutes   Chest Stretch Limitations arms abducted to 90 degrees on half foam roll  PT Short Term Goals - 09/18/14 0842    PT SHORT TERM GOAL #1   Title Indepenent with initial HEP (09/28/14)   Status Achieved           PT Long Term Goals - 09/18/14 6440    PT LONG TERM GOAL #1   Title Independent with advanced HEP (10/26/14)   Status On-going   PT LONG TERM GOAL #2   Title Cervical ROM to >/= 75% of normal range without pain (10/26/14)   Status On-going   PT LONG TERM GOAL #3   Title Patient without sleep disturbances related to cervical/neck pain (10/26/14)   Status On-going   PT LONG TERM GOAL #4   Title Patient able to perform overhead activities without increased neck pain   Status On-going               Plan - 09/20/14 3474    Clinical Impression Statement Good tolerance to all exercises and manual work. Attempted traction today since pt reports relief with SOR.    PT Next Visit Plan Assess response to  traction, posture exercises and cervical stretching, ROM/strengthening exercises, manual therapy, modalities PRN   Consulted and Agree with Plan of Care Patient        Problem List Patient Active Problem List   Diagnosis Date Noted  . Neck pain 08/28/2014  . Multiple myeloma 07/13/2012  . Leukopenia 07/13/2012  . Right lateral epicondylitis 01/09/2012  . Major depressive disorder, recurrent episode, moderate 10/02/2011  . Depression, acute 02/04/2011  . OSTEOPOROSIS 01/08/2009  . Migraine 12/29/2008    Barbette Hair, PTA 09/20/2014, 9:31 AM  [facility] [address]  [place], [place], [address] Phone: [phone]   Fax:  [phone]

## 2014-09-25 ENCOUNTER — Ambulatory Visit: Payer: 59 | Admitting: Physical Therapy

## 2014-09-25 DIAGNOSIS — M542 Cervicalgia: Secondary | ICD-10-CM

## 2014-09-25 DIAGNOSIS — M436 Torticollis: Secondary | ICD-10-CM

## 2014-09-25 NOTE — Therapy (Signed)
[facility] [address]  [place], [place], [address] Phone: [phone]   Fax:  [phone]  Physical Therapy Treatment  Patient Details  Name: Nancy Parks MRN: [id number] Date of Birth: 07-Jul-1950 Referring Provider:  Gregor Hams, MD  Encounter Date: 09/25/2014      PT End of Session - 09/25/14 0852    Visit Number 8   Number of Visits 16   Date for PT Re-Evaluation 10/26/14   PT Start Time 0801   PT Stop Time 0901   PT Time Calculation (min) 60 min   Activity Tolerance Patient tolerated treatment well   Behavior During Therapy [facility] for tasks assessed/performed      Past Medical History  Diagnosis Date  . Headache(784.0)     Migraines  . Leukopenia   . Depression   . Multiple myeloma, without mention of having achieved remission 07/13/2012  . Leukopenia 07/13/2012    Past Surgical History  Procedure Laterality Date  . Laparoscopic endometriosis fulguration  1979  . Cataract extraction  09/2007  . Basal cell carcinoma excision  2013    removed from nose    There were no vitals filed for this visit.  Visit Diagnosis:  Cervical pain (neck)  Stiffness of cervical spine      Subjective Assessment - 09/25/14 0804    Subjective "I'm not awake yet this morning. I overslept and so I haven't done my stretches or exercises this morning."   Currently in Pain? Yes   Pain Score 4   3-4/10   Pain Location Neck   Pain Orientation Left;Posterior   Multiple Pain Sites No                [facility] Adult PT Treatment/Exercise - 09/25/14 0803    Exercises   Exercises Neck   Neck Exercises: Machines for Strengthening   UBE (Upper Arm Bike) level 2.5 3'/3' fwd/bwd   Cybex Row Low row 20# x10, 25# x10   Neck Exercises: Theraband   Shoulder Extension 15 reps;Green   Shoulder Extension Limitations standing   Rows 15 reps;Green   Rows Limitations standing   Shoulder External Rotation 15 reps;Green   Shoulder External Rotation Limitations bilateral ER, supine on 1/2 foam roll   Horizontal ABduction 15 reps;Green   Horizontal ABduction Limitations supine hooklying on 1/2 foam roll   Neck Exercises: Standing   Wall Push Ups 15 reps   Wall Push Ups Limitations TRX   Neck Exercises: Supine   X to V 15 reps;Weight   X to V Weights (lbs) 2#   X to V Limitations on 1/2 foam roll   Shoulder Flexion Both;Weights;15 reps   Shoulder Flexion Weights (lbs) 4#   Shoulder Flexion Limitations pullover on 1/2 foam roll   Shoulder ABduction 15 reps;Both   Shoulder Abduction Weights (lbs) 2#   Shoulder Abduction Limitations Alternating Horizontal Abduction, hooklying on 1/2 foam roll   Modalities   Modalities Traction   Traction   Type of Traction Cervical   Min (lbs) 6   Max (lbs) 12   Hold Time 60   Rest Time 20   Time 15' at a 10 degree pull   Manual Therapy   Manual Therapy Soft tissue mobilization   Soft tissue mobilization to bilateral (Lt>Rt) upper trap, suboccipitals, SCM, levator, c-spine parapsinals, SOR   Neck Exercises: Stretches   Upper Trapezius Stretch 2 reps;30 seconds   Upper Trapezius Stretch Limitations manually   Levator  Stretch 2 reps;30 seconds   Levator Stretch Limitations manually   Chest Stretch --  2 minutes   Chest Stretch Limitations arms abducted to 90 degrees on half foam roll   Other Neck Stretches SCM 2 sets 30 seconds, manually                PT Education - 09/25/14 0856    Education Details TB exercises added to HEP   Person(s) Educated Patient   Methods Explanation;Demonstration   Comprehension Verbalized understanding;Returned demonstration          PT Short Term Goals - 09/18/14 0842    PT SHORT TERM GOAL #1   Title Indepenent with initial HEP (09/28/14)   Status Achieved           PT Long Term Goals - 09/18/14 2111    PT LONG TERM GOAL #1   Title Independent with advanced HEP (10/26/14)   Status On-going   PT LONG TERM GOAL  #2   Title Cervical ROM to >/= 75% of normal range without pain (10/26/14)   Status On-going   PT LONG TERM GOAL #3   Title Patient without sleep disturbances related to cervical/neck pain (10/26/14)   Status On-going   PT LONG TERM GOAL #4   Title Patient able to perform overhead activities without increased neck pain   Status On-going               Plan - 09/25/14 5520    Clinical Impression Statement Patient with asymmetrical posture today with upper trunk shifted to right with left shoulder elevated, therefore postural correction incorporated into exercises today. Patient reporting relief from traction lasted all day after last therapy visit, therefore included in visit today.   PT Next Visit Plan posture exercises and cervical stretching, ROM/strengthening exercises, manual therapy, traction, modalities PRN   Consulted and Agree with Plan of Care Patient        Problem List Patient Active Problem List   Diagnosis Date Noted  . Neck pain 08/28/2014  . Multiple myeloma 07/13/2012  . Leukopenia 07/13/2012  . Right lateral epicondylitis 01/09/2012  . Major depressive disorder, recurrent episode, moderate 10/02/2011  . Depression, acute 02/04/2011  . OSTEOPOROSIS 01/08/2009  . Migraine 12/29/2008    Percival Spanish, PT, MPT 09/25/2014, 12:37 PM  [facility] [address]  [place], [place], [address] Phone: [phone]   Fax:  [phone]

## 2014-09-27 ENCOUNTER — Ambulatory Visit: Payer: 59 | Admitting: Physical Therapy

## 2014-09-27 DIAGNOSIS — M542 Cervicalgia: Secondary | ICD-10-CM

## 2014-09-27 DIAGNOSIS — M436 Torticollis: Secondary | ICD-10-CM

## 2014-09-27 NOTE — Therapy (Signed)
[facility] [address]  [place], [place], [address] Phone: [phone]   Fax:  [phone]  Physical Therapy Treatment  Patient Details  Name: Nancy Parks MRN: [id number] Date of Birth: 1950/07/16 Referring Provider:  Gregor Hams, MD  Encounter Date: 09/27/2014      PT End of Session - 09/27/14 0842    Visit Number 9   Number of Visits 16   Date for PT Re-Evaluation 10/26/14   PT Start Time 0800   PT Stop Time 0858   PT Time Calculation (min) 58 min   Activity Tolerance Patient tolerated treatment well   Behavior During Therapy [facility] for tasks assessed/performed      Past Medical History  Diagnosis Date  . Headache(784.0)     Migraines  . Leukopenia   . Depression   . Multiple myeloma, without mention of having achieved remission 07/13/2012  . Leukopenia 07/13/2012    Past Surgical History  Procedure Laterality Date  . Laparoscopic endometriosis fulguration  1979  . Cataract extraction  09/2007  . Basal cell carcinoma excision  2013    removed from nose    There were no vitals filed for this visit.  Visit Diagnosis:  Cervical pain (neck)  Stiffness of cervical spine      Subjective Assessment - 09/27/14 0803    Subjective Reports increased soreness and stiffness despite already completeing stretches and exercises. States it may be a result of not sleepig well last night. States pain is reactive to movement, 2/10 if keeps head still, but 5-6/10 with head movements. Patient feels like she may be developing a migraine. Reports yesterday was a really good day.   Currently in Pain? Yes   Pain Score 6   5-6/10   Pain Location Neck   Pain Orientation Left;Posterior   Pain Descriptors / Indicators Patsi Sears PT Assessment - 09/27/14 0801    Assessment   Next MD Visit 09/28/14                  [facility] Adult PT Treatment/Exercise - 09/27/14 0801    Exercises   Exercises  Neck   Neck Exercises: Machines for Strengthening   UBE (Upper Arm Bike) level 2.5 3'/3' fwd/bwd   Neck Exercises: Theraband   Shoulder External Rotation 15 reps;Green   Shoulder External Rotation Limitations bilateral ER, sitting   Horizontal ABduction 15 reps;Green   Horizontal ABduction Limitations sitting   Neck Exercises: Supine   X to V 15 reps;Weight   X to V Weights (lbs) 2#   X to V Limitations on 1/2 foam roll   Shoulder Flexion Both;Weights;15 reps   Shoulder Flexion Weights (lbs) 4#   Shoulder Flexion Limitations pullover on 1/2 foam roll   Shoulder ABduction 15 reps;Both   Shoulder Abduction Weights (lbs) 2#   Shoulder Abduction Limitations Alternating Horizontal Abduction; hooklying on 1/2 foam roll   Neck Exercises: Prone   Shoulder Extension 10 reps;Weights   Shoulder Extension Weights (lbs) 2#   Shoulder Extension Limitations L UE - 2 sets, 2nd set with weight   Rows 10 reps;Weights   Rows Weights (lbs) 2#   Rows Limitations L UE - 2 sets, 2nd set with weight   Modalities   Modalities Traction   Traction   Type of Traction Cervical   Min (lbs) 6   Max (lbs) 12   Hold Time 60  Rest Time 20   Time 15' at a 10 degree pull   Manual Therapy   Manual Therapy Soft tissue mobilization   Soft tissue mobilization to bilateral (Lt>Rt) upper trap, suboccipitals, SCM, levator, c-spine parapsinals, SOR   Neck Exercises: Stretches   Upper Trapezius Stretch 2 reps;30 seconds   Upper Trapezius Stretch Limitations manually   Levator Stretch 2 reps;30 seconds   Levator Stretch Limitations manually   Chest Stretch --  2 minutes   Chest Stretch Limitations arms abducted to 90 degrees on half foam roll                  PT Short Term Goals - 09/27/14 1252    PT SHORT TERM GOAL #1   Title Independent with initial HEP (09/28/14)   Status Achieved           PT Long Term Goals - 09/27/14 1252    PT LONG TERM GOAL #1   Title Independent with advanced HEP  (10/26/14)   Status On-going   PT LONG TERM GOAL #2   Title Cervical ROM to >/= 75% of normal range without pain (10/26/14)   Status On-going   PT LONG TERM GOAL #3   Title Patient without sleep disturbances related to cervical/neck pain (10/26/14)   Status On-going   PT LONG TERM GOAL #4   Title Patient able to perform overhead activities without increased neck pain   Status On-going               Plan - 09/27/14 1249    Clinical Impression Statement Improved trunk symmetry today, but still with elevated left shoulder and guarding in upper trap therefore therapy focused on  soft tissue mobilization and stretching along with exercise targeting improved neutral shoulder alignment. Pain somewhat increased today but patient reporting poor sleep last night and potential onset of a migraine. Overall patient reporting improvement with pain and flexibilty and seems pleased with her progress.   PT Next Visit Plan posture exercises and cervical stretching, ROM/strengthening exercises, manual therapy, traction, modalities PRN, ROM check next visit   Consulted and Agree with Plan of Care Patient        Problem List Patient Active Problem List   Diagnosis Date Noted  . Neck pain 08/28/2014  . Multiple myeloma 07/13/2012  . Leukopenia 07/13/2012  . Right lateral epicondylitis 01/09/2012  . Major depressive disorder, recurrent episode, moderate 10/02/2011  . Depression, acute 02/04/2011  . OSTEOPOROSIS 01/08/2009  . Migraine 12/29/2008    Percival Spanish, PT, MPT 09/27/2014, 1:02 PM  [facility] [address]  [place], [place], [address] Phone: [phone]   Fax:  [phone]

## 2014-09-28 ENCOUNTER — Encounter: Payer: Self-pay | Admitting: Family Medicine

## 2014-09-28 ENCOUNTER — Ambulatory Visit (INDEPENDENT_AMBULATORY_CARE_PROVIDER_SITE_OTHER): Payer: 59 | Admitting: Family Medicine

## 2014-09-28 VITALS — BP 116/70 | HR 56 | Wt 176.0 lb

## 2014-09-28 DIAGNOSIS — M542 Cervicalgia: Secondary | ICD-10-CM

## 2014-09-28 NOTE — Progress Notes (Addendum)
Nancy Parks is a 64 y.o. female who presents to [facility]  today for follow-up neck pain. Patient is doing much better since her last visit. She is attended about 9 physical therapy sessions. She would like to continue intermittent physical therapy as needed. She's using e-stim unit in the past and is interested in a TENS unit. No fevers chills nausea vomiting diarrhea weakness or numbness.   Past Medical History  Diagnosis Date  . Headache(784.0)     Migraines  . Leukopenia   . Depression   . Multiple myeloma, without mention of having achieved remission 07/13/2012  . Leukopenia 07/13/2012   Past Surgical History  Procedure Laterality Date  . Laparoscopic endometriosis fulguration  1979  . Cataract extraction  09/2007  . Basal cell carcinoma excision  2013    removed from nose   Social History  Substance Use Topics  . Smoking status: Never Smoker   . Smokeless tobacco: Never Used     Comment: never used tobacco  . Alcohol Use: No   ROS as above Medications: Current Outpatient Prescriptions  Medication Sig Dispense Refill  . propranolol ER (INDERAL LA) 80 MG 24 hr capsule Take 80 mg by mouth.    Marland Kitchen amitriptyline (ELAVIL) 50 MG tablet TAKE 1 TABLET(S) BY MOUTH EVERY NIGHT AT BEDTIME 90 tablet 1  . Misc Natural Products (TURMERIC CURCUMIN) CAPS Take 150 capsules by mouth.    . NON FORMULARY Take by mouth at bedtime. TRIPHALA    . Specialty Vitamins Products (MAGNESIUM, AMINO ACID CHELATE,) 133 MG tablet Take 1 tablet by mouth 2 (two) times daily. 1000 mg bid    . SUMAtriptan (IMITREX) 100 MG tablet Take 100 mg by mouth as needed for migraine.     No current facility-administered medications for this visit.   Allergies  Allergen Reactions  . Latex Rash    REACTION: Rash REACTION: Rash REACTION: Rash  . Tape Rash     Exam:  BP 116/70 mmHg  Pulse 56  Wt 176 lb (79.833 kg) Gen: Well NAD HEENT: EOMI,  MMM Lungs: Normal work of  breathing. CTABL Heart: RRR no MRG Abd: NABS, Soft. Nondistended, Nontender Exts: Brisk capillary refill, warm and well perfused.   neck is nontender  No results found for this or any previous visit (from the past 24 hour(s)). No results found.   Please see individual assessment and plan sections.

## 2014-09-28 NOTE — Assessment & Plan Note (Signed)
Improving with physical therapy. Provided patient information for TENS unit. Warned about risk TENS unit and cancers. We will reauthorize physical therapy as needed.

## 2014-09-28 NOTE — Patient Instructions (Signed)
Thank you for coming in today. Return as needed.   TENS UNIT: This is helpful for muscle pain and spasm.   Search and Purchase a TENS 7000 2nd edition at www.tenspros.com. It should be less than $30.     TENS unit instructions: Do not shower or bathe with the unit on Turn the unit off before removing electrodes or batteries If the electrodes lose stickiness add a drop of water to the electrodes after they are disconnected from the unit and place on plastic sheet. If you continued to have difficulty, call the TENS unit company to purchase more electrodes. Do not apply lotion on the skin area prior to use. Make sure the skin is clean and dry as this will help prolong the life of the electrodes. After use, always check skin for unusual red areas, rash or other skin difficulties. If there are any skin problems, does not apply electrodes to the same area. Never remove the electrodes from the unit by pulling the wires. Do not use the TENS unit or electrodes other than as directed. Do not change electrode placement without consultating your therapist or physician. Keep 2 fingers with between each electrode. Wear time ratio is 2:1, on to off times.    For example on for 30 minutes off for 15 minutes and then on for 30 minutes off for 15 minutes

## 2014-09-28 NOTE — Addendum Note (Signed)
Addended by: Gregor Hams on: 09/28/2014 08:53 AM   Modules accepted: Medications

## 2014-10-02 ENCOUNTER — Ambulatory Visit: Payer: 59 | Admitting: Rehabilitation

## 2014-10-02 DIAGNOSIS — M542 Cervicalgia: Secondary | ICD-10-CM

## 2014-10-02 DIAGNOSIS — M436 Torticollis: Secondary | ICD-10-CM

## 2014-10-02 NOTE — Therapy (Signed)
[facility] [address]  [place], [place], [address] Phone: [phone]   Fax:  [phone]  Physical Therapy Treatment  Patient Details  Name: Nancy Parks MRN: [id number] Date of Birth: Apr 21, 1950 Referring Provider:  Gregor Hams, MD  Encounter Date: 10/02/2014      PT End of Session - 10/02/14 1618    Visit Number 10   Number of Visits 16   Date for PT Re-Evaluation 10/26/14   PT Start Time 2947   PT Stop Time 1714   PT Time Calculation (min) 61 min      Past Medical History  Diagnosis Date  . Headache(784.0)     Migraines  . Leukopenia   . Depression   . Multiple myeloma, without mention of having achieved remission 07/13/2012  . Leukopenia 07/13/2012    Past Surgical History  Procedure Laterality Date  . Laparoscopic endometriosis fulguration  1979  . Cataract extraction  09/2007  . Basal cell carcinoma excision  2013    removed from nose    There were no vitals filed for this visit.  Visit Diagnosis:  Cervical pain (neck)  Stiffness of cervical spine      Subjective Assessment - 10/02/14 1616    Subjective Reports pain has done really well over the weekend and is doing well today. MD told her to continue therapy as needed.    Currently in Pain? Yes   Pain Score 3    Pain Location Neck   Pain Orientation Left;Posterior                         [facility] Adult PT Treatment/Exercise - 10/02/14 1618    Exercises   Exercises Neck   Neck Exercises: Machines for Strengthening   UBE (Upper Arm Bike) level 2.5 3'/3' fwd/bwd   Neck Exercises: Supine   X to V 15 reps;Weight   X to V Weights (lbs) 2#   X to V Limitations on 1/2 foam roll   Shoulder Flexion Both;Weights;15 reps   Shoulder Flexion Weights (lbs) 4#   Shoulder Flexion Limitations pullover on 1/2 foam roll   Shoulder ABduction 15 reps;Both   Shoulder Abduction Weights (lbs) 2#   Shoulder Abduction Limitations  Alternating horizontal Abduction on 1/2 foam roll   Neck Exercises: Prone   Shoulder Extension 15 reps;Weights   Shoulder Extension Weights (lbs) 2#   Shoulder Extension Limitations Lt UE    Rows 10 reps;Weights   Rows Weights (lbs) 2#   Rows Limitations Lt UE   Modalities   Modalities Traction   Traction   Type of Traction Cervical   Min (lbs) 6   Max (lbs) 12   Hold Time 60   Rest Time 20   Time 15' at a 10 degree pull   Manual Therapy   Manual Therapy Soft tissue mobilization   Soft tissue mobilization to bilateral (Lt>Rt) upper trap, suboccipitals, SCM, levator, c-spine parapsinals, SOR   Neck Exercises: Stretches   Upper Trapezius Stretch 2 reps;30 seconds   Upper Trapezius Stretch Limitations manually   Levator Stretch 2 reps;30 seconds   Levator Stretch Limitations manually   Chest Stretch --  2 minute   Chest Stretch Limitations arms abducted to 90 degrees on half foam roll                  PT Short Term Goals - 09/27/14 1252    PT SHORT TERM  GOAL #1   Title Independent with initial HEP (09/28/14)   Status Achieved           PT Long Term Goals - 09/27/14 1252    PT LONG TERM GOAL #1   Title Independent with advanced HEP (10/26/14)   Status On-going   PT LONG TERM GOAL #2   Title Cervical ROM to >/= 75% of normal range without pain (10/26/14)   Status On-going   PT LONG TERM GOAL #3   Title Patient without sleep disturbances related to cervical/neck pain (10/26/14)   Status On-going   PT LONG TERM GOAL #4   Title Patient able to perform overhead activities without increased neck pain   Status On-going               Plan - 10/02/14 1658    Clinical Impression Statement Good tolerance to treatment noting only slight increase in neck with with prone rows. Pt's MD told her about a TENS unit she can get to help with the pain but she isn't sure if she wants to use it if it isn't a long term solution. Told her we can try it if she would like.  Continued with focus on manual work.    PT Next Visit Plan posture exercises and cervical stretching, ROM/strengthening exercises, manual therapy, traction, modalities PRN, ROM check next visit   Consulted and Agree with Plan of Care Patient        Problem List Patient Active Problem List   Diagnosis Date Noted  . Neck pain 08/28/2014  . Multiple myeloma 07/13/2012  . Leukopenia 07/13/2012  . Right lateral epicondylitis 01/09/2012  . Major depressive disorder, recurrent episode, moderate 10/02/2011  . Depression, acute 02/04/2011  . OSTEOPOROSIS 01/08/2009  . Migraine 12/29/2008    Barbette Hair, PTA 10/02/2014, 4:59 PM  [facility] [address]  [place], [place], [address] Phone: [phone]   Fax:  [phone]

## 2014-10-04 ENCOUNTER — Ambulatory Visit: Payer: 59 | Admitting: Physical Therapy

## 2014-10-04 DIAGNOSIS — M542 Cervicalgia: Secondary | ICD-10-CM | POA: Diagnosis not present

## 2014-10-04 DIAGNOSIS — M436 Torticollis: Secondary | ICD-10-CM

## 2014-10-04 NOTE — Therapy (Signed)
[facility] [address]  [place], [place], [address] Phone: [phone]   Fax:  [phone]  Physical Therapy Treatment  Patient Details  Name: Nancy Parks MRN: [id number] Date of Birth: 18-Oct-1950 Referring Provider:  Gregor Hams, MD  Encounter Date: 10/04/2014      PT End of Session - 10/04/14 0903    Visit Number 11   Number of Visits 16   Date for PT Re-Evaluation 10/26/14   PT Start Time 6568   PT Stop Time 0944   PT Time Calculation (min) 57 min   Activity Tolerance Patient tolerated treatment well   Behavior During Therapy [facility] for tasks assessed/performed      Past Medical History  Diagnosis Date  . Headache(784.0)     Migraines  . Leukopenia   . Depression   . Multiple myeloma, without mention of having achieved remission 07/13/2012  . Leukopenia 07/13/2012    Past Surgical History  Procedure Laterality Date  . Laparoscopic endometriosis fulguration  1979  . Cataract extraction  09/2007  . Basal cell carcinoma excision  2013    removed from nose    There were no vitals filed for this visit.  Visit Diagnosis:  Cervical pain (neck)  Stiffness of cervical spine      Subjective Assessment - 10/04/14 0930    Subjective Patient noting increased left shoulder elevation but corrected by chiropracter yesterday. Reports muscles "screaming" (5-6/10 pain) in left side of neck to upper trap last night but somewhat relieved with application of essential oils.   Currently in Pain? Yes   Pain Score 4    Pain Location Neck   Pain Orientation Left;Posterior            [facility] PT Assessment - 10/04/14 0903    Observation/Other Assessments   Focus on Therapeutic Outcomes (FOTO)  58% (42% limitation)                 [facility] Adult PT Treatment/Exercise - 10/04/14 0903    Exercises   Exercises Neck   Neck Exercises: Machines for Strengthening   UBE (Upper Arm Bike) level 2.5 3'/3' fwd/bwd    Neck Exercises: Supine   X to V 15 reps;Weight   X to V Weights (lbs) 3#   X to V Limitations on 1/2 foam roll   Shoulder Flexion Both;Weights;15 reps   Shoulder Flexion Weights (lbs) 5#   Shoulder Flexion Limitations pullover on 1/2 foam roll   Shoulder ABduction 15 reps;Both   Shoulder Abduction Weights (lbs) 2#   Shoulder Abduction Limitations Alternating Horiz Abd on 1/2 foam roll   Neck Exercises: Prone   Shoulder Extension 15 reps;Weights   Shoulder Extension Weights (lbs) 2#   Shoulder Extension Limitations Lt UE   Rows 15 reps;Weights   Rows Weights (lbs) 2#   Rows Limitations Lt UE   Upper Extremity Flexion with Stabilization 10 reps   UE Flexion with Stabilization Limitations Lt UE   Modalities   Modalities Traction   Traction   Type of Traction Cervical   Min (lbs) 7   Max (lbs) 14   Hold Time 60   Rest Time 20   Time 15' at a 10 degree pull   Manual Therapy   Manual Therapy Soft tissue mobilization   Soft tissue mobilization to bilateral (Lt>Rt) upper trap, suboccipitals, SCM, levator, c-spine parapsinals, SOR  PT Short Term Goals - 09/27/14 1252    PT SHORT TERM GOAL #1   Title Independent with initial HEP (09/28/14)   Status Achieved           PT Long Term Goals - 10/04/14 1321    PT LONG TERM GOAL #1   Title Independent with advanced HEP (10/26/14)   Status On-going   PT LONG TERM GOAL #2   Title Cervical ROM to >/= 75% of normal range without pain (10/26/14)   Status On-going   PT LONG TERM GOAL #3   Title Patient without sleep disturbances related to cervical/neck pain (10/26/14)   Status On-going   PT LONG TERM GOAL #4   Title Patient able to perform overhead activities without increased neck pain   Status On-going               Plan - 10/04/14 0940    Clinical Impression Statement Patient with increased irritation/pain last night after chiropracter visit yesterday, but calming down some this morning.  Tolerating progression of resstance without increased pain today.   PT Next Visit Plan posture exercises and cervical stretching, ROM/strengthening exercises, manual therapy, traction, modalities PRN, ROM check next visit   Consulted and Agree with Plan of Care Patient        Problem List Patient Active Problem List   Diagnosis Date Noted  . Neck pain 08/28/2014  . Multiple myeloma 07/13/2012  . Leukopenia 07/13/2012  . Right lateral epicondylitis 01/09/2012  . Major depressive disorder, recurrent episode, moderate 10/02/2011  . Depression, acute 02/04/2011  . OSTEOPOROSIS 01/08/2009  . Migraine 12/29/2008    Percival Spanish, PT, MPT 10/04/2014, 1:22 PM  [facility] [address]  [place], [place], [address] Phone: [phone]   Fax:  [phone]

## 2014-10-09 ENCOUNTER — Ambulatory Visit: Payer: 59 | Admitting: Physical Therapy

## 2014-10-09 DIAGNOSIS — M542 Cervicalgia: Secondary | ICD-10-CM

## 2014-10-09 DIAGNOSIS — M436 Torticollis: Secondary | ICD-10-CM

## 2014-10-09 NOTE — Therapy (Signed)
[facility] [address]  [place], [place], [address] Phone: [phone]   Fax:  [phone]  Physical Therapy Treatment  Patient Details  Name: Nancy Parks MRN: [id number] Date of Birth: [birth year] Referring Provider:  Gregor Hams, MD  Encounter Date: 10/09/2014      PT End of Session - 10/09/14 0930    Visit Number 12   Number of Visits 16   Date for PT Re-Evaluation 10/26/14   PT Start Time 0849   PT Stop Time 0947   PT Time Calculation (min) 58 min   Activity Tolerance Patient tolerated treatment well   Behavior During Therapy [facility] for tasks assessed/performed      Past Medical History  Diagnosis Date  . Headache(784.0)     Migraines  . Leukopenia   . Depression   . Multiple myeloma, without mention of having achieved remission 07/13/2012  . Leukopenia 07/13/2012    Past Surgical History  Procedure Laterality Date  . Laparoscopic endometriosis fulguration  1979  . Cataract extraction  09/2007  . Basal cell carcinoma excision  2013    removed from nose    There were no vitals filed for this visit.  Visit Diagnosis:  Cervical pain (neck)  Stiffness of cervical spine      Subjective Assessment - 10/09/14 0854    Subjective Patient reports her neck is still "talking to her", mostly on the right. Continues to report difficulty turning head to left.   Currently in Pain? Yes   Pain Score 3    Pain Location Neck   Pain Orientation Posterior;Right            [facility] PT Assessment - 10/09/14 0849    ROM / Strength   AROM / PROM / Strength AROM;Strength   AROM   Overall AROM Comments Bilateral shoulder ROM WFL/WNL   AROM Assessment Site Cervical   Cervical Flexion 42   Cervical Extension 52   Cervical - Right Side Bend 25   Cervical - Left Side Bend 26   Cervical - Right Rotation 67   Cervical - Left Rotation 60   Strength   Overall Strength Within functional limits for tasks performed   Bilateral UE                     [facility] Adult PT Treatment/Exercise - 10/09/14 0849    Exercises   Exercises Neck   Neck Exercises: Machines for Strengthening   UBE (Upper Arm Bike) level 3 3'/3' fwd/bwd   Neck Exercises: Supine   X to V 15 reps;Weight   X to V Weights (lbs) 3#   X to V Limitations on 1/2 foam roll   Shoulder Flexion Both;Weights;15 reps   Shoulder Flexion Weights (lbs) 5#   Shoulder Flexion Limitations pullover on 1/2 foam roll   Shoulder ABduction 15 reps;Both   Shoulder Abduction Weights (lbs) 3#   Shoulder Abduction Limitations Lternating Horiz Abd on 1/2 foam roll   Traction   Type of Traction Cervical   Min (lbs) 7   Max (lbs) 14   Hold Time 60   Rest Time 20   Time 15' at a 10 degree pull   Manual Therapy   Manual Therapy Soft tissue mobilization   Soft tissue mobilization to bilateral (Lt>Rt) upper trap, suboccipitals, SCM, levator, c-spine parapsinals, SOR   Neck Exercises: Stretches   Upper Trapezius Stretch 2 reps;30 seconds   Upper Trapezius Stretch Limitations  manually   Levator Stretch 2 reps;30 seconds   Levator Stretch Limitations manually                  PT Short Term Goals - 09/27/14 1252    PT SHORT TERM GOAL #1   Title Independent with initial HEP (09/28/14)   Status Achieved           PT Long Term Goals - 10/04/14 1321    PT LONG TERM GOAL #1   Title Independent with advanced HEP (10/26/14)   Status On-going   PT LONG TERM GOAL #2   Title Cervical ROM to >/= 75% of normal range without pain (10/26/14)   Status On-going   PT LONG TERM GOAL #3   Title Patient without sleep disturbances related to cervical/neck pain (10/26/14)   Status On-going   PT LONG TERM GOAL #4   Title Patient able to perform overhead activities without increased neck pain   Status On-going               Plan - 10/09/14 0931    Clinical Impression Statement Patient with essentially symmetrical cervical ROM except slight  limitation in left rotation relative to right rotation, with ROM WFL in all planes. Patient tolerating all exercises wihtout complaints of pain, although continues to present to therapy with c/o pain nearly every visit, with pain fluctuating in intensity and location.   PT Next Visit Plan posture exercises and cervical stretching, ROM/strengthening exercises, manual therapy, traction, modalities PRN, ROM check next visit   Consulted and Agree with Plan of Care Patient        Problem List Patient Active Problem List   Diagnosis Date Noted  . Neck pain 08/28/2014  . Multiple myeloma 07/13/2012  . Leukopenia 07/13/2012  . Right lateral epicondylitis 01/09/2012  . Major depressive disorder, recurrent episode, moderate 10/02/2011  . Depression, acute 02/04/2011  . OSTEOPOROSIS 01/08/2009  . Migraine 12/29/2008    Percival Spanish, PT, MPT 10/09/2014, 10:10 AM  [facility] [address]  [place], [place], [address] Phone: [phone]   Fax:  [phone]

## 2014-10-11 ENCOUNTER — Ambulatory Visit: Payer: 59 | Admitting: Physical Therapy

## 2014-10-11 DIAGNOSIS — M542 Cervicalgia: Secondary | ICD-10-CM

## 2014-10-11 DIAGNOSIS — M436 Torticollis: Secondary | ICD-10-CM

## 2014-10-11 NOTE — Therapy (Signed)
[facility] [address]  [place], [place], [address] Phone: [phone]   Fax:  [phone]  Physical Therapy Treatment  Patient Details  Name: Nancy Parks MRN: [id number] Date of Birth: 01/15/1951 Referring Provider:  Gregor Hams, MD  Encounter Date: 10/11/2014      PT End of Session - 10/11/14 0819    Visit Number 13   Number of Visits 16   Date for PT Re-Evaluation 10/26/14   PT Start Time 0802   PT Stop Time 0856   PT Time Calculation (min) 54 min   Activity Tolerance Patient tolerated treatment well   Behavior During Therapy [facility] for tasks assessed/performed      Past Medical History  Diagnosis Date  . Headache(784.0)     Migraines  . Leukopenia   . Depression   . Multiple myeloma, without mention of having achieved remission 07/13/2012  . Leukopenia 07/13/2012    Past Surgical History  Procedure Laterality Date  . Laparoscopic endometriosis fulguration  1979  . Cataract extraction  09/2007  . Basal cell carcinoma excision  2013    removed from nose    There were no vitals filed for this visit.  Visit Diagnosis:  Cervical pain (neck)  Stiffness of cervical spine      Subjective Assessment - 10/11/14 0805    Subjective Patient states pain minimal today but notes swelling in her neck. States increased awarenesss (not pain) when moving/turning neck. Saw chiropracter after last PT visit, and reports chiropracter focused mostly on her back but did work some on her neck.   Currently in Pain? Yes   Pain Score 2    Pain Location Neck                  [facility] Adult PT Treatment/Exercise - 10/11/14 0803    Exercises   Exercises Neck   Neck Exercises: Machines for Strengthening   UBE (Upper Arm Bike) level 3 3'/3' fwd/bwd   Neck Exercises: Supine   Cervical Isometrics Right lateral flexion;Left lateral flexion;Right rotation;Left rotation;5 secs;5 reps   X to V 15 reps;Weight   X to V  Weights (lbs) 3#   X to V Limitations on 1/2 foam roll   Shoulder Flexion Both;Weights;15 reps   Shoulder Flexion Weights (lbs) 5#   Shoulder Flexion Limitations pullover on 1/2 foam roll   Shoulder ABduction 15 reps;Both   Shoulder Abduction Weights (lbs) 3#   Shoulder Abduction Limitations Alternating horiz abd on 1/2 foam roll   Neck Exercises: Prone   Shoulder Extension 15 reps;Weights   Shoulder Extension Weights (lbs) 3#   Shoulder Extension Limitations Lt UE   Rows 15 reps;Weights   Rows Weights (lbs) 3#   Rows Limitations Lt UE   Other Prone Exercise "T" - Lt UE 1# x15   Modalities   Modalities Traction   Traction   Type of Traction Cervical   Min (lbs) 7   Max (lbs) 14   Hold Time 60   Rest Time 20   Time 15' at a 10 degree pull   Manual Therapy   Manual Therapy Soft tissue mobilization   Soft tissue mobilization to bilateral (Lt>Rt) upper trap, suboccipitals, SCM, levator, c-spine parapsinals, SOR   Neck Exercises: Stretches   Upper Trapezius Stretch 2 reps;30 seconds   Upper Trapezius Stretch Limitations manually   Levator Stretch 2 reps;30 seconds   Levator Stretch Limitations manually   Other Neck Stretches SCM 2  sets 30 seconds, manually   Other Neck Stretches SCM self-stretch 3x20"                  PT Short Term Goals - 09/27/14 1252    PT SHORT TERM GOAL #1   Title Independent with initial HEP (09/28/14)   Status Achieved           PT Long Term Goals - 10/11/14 0845    PT LONG TERM GOAL #1   Title Independent with advanced HEP (10/26/14)   Status On-going   PT LONG TERM GOAL #2   Title Cervical ROM to >/= 75% of normal range without pain (10/26/14)   Status On-going   PT LONG TERM GOAL #3   Title Patient without sleep disturbances related to cervical/neck pain (10/26/14)   Status On-going   PT LONG TERM GOAL #4   Title Patient able to perform overhead activities without increased neck pain   Status On-going               Plan -  10/11/14 0819    Clinical Impression Statement ROM restriction appears to be associated with tightness in left SCM, therefore resumed manual stretching and STM to SCM as well as provided instruction in self-stretch for SCM. Patient pleased with overall progress but worried about carryover when therapy finishes. Discussed option to reduce frequency to 1x/wk and continue with HEP to monitor ability to maintain therapy gains and evaluate any flare-ups.   PT Next Visit Plan posture exercises and cervical stretching, ROM/strengthening exercises, manual therapy, traction, modalities PRN, ROM check next visit   Consulted and Agree with Plan of Care Patient        Problem List Patient Active Problem List   Diagnosis Date Noted  . Neck pain 08/28/2014  . Multiple myeloma 07/13/2012  . Leukopenia 07/13/2012  . Right lateral epicondylitis 01/09/2012  . Major depressive disorder, recurrent episode, moderate 10/02/2011  . Depression, acute 02/04/2011  . OSTEOPOROSIS 01/08/2009  . Migraine 12/29/2008    Percival Spanish, PT, MPT 10/11/2014, 8:50 AM  [facility] [address]  [place], [place], [address] Phone: [phone]   Fax:  [phone]

## 2014-10-18 ENCOUNTER — Ambulatory Visit: Payer: 59 | Admitting: Rehabilitation

## 2014-10-25 ENCOUNTER — Ambulatory Visit: Payer: 59 | Attending: Family Medicine | Admitting: Physical Therapy

## 2014-10-25 DIAGNOSIS — M542 Cervicalgia: Secondary | ICD-10-CM | POA: Diagnosis not present

## 2014-10-25 DIAGNOSIS — M436 Torticollis: Secondary | ICD-10-CM

## 2014-10-25 NOTE — Therapy (Addendum)
[facility] [address]  [address] [place], [place], [address] Phone: [phone]   Fax:  [phone]  Physical Therapy Treatment  Patient Details  Name: Nancy Parks MRN: [id number] Date of Birth: Apr 06, 1950 Referring Provider:  Rodolph Bong, MD  Encounter Date: 10/25/2014      PT End of Session - 10/25/14 0810    Visit Number 14   Number of Visits 16   PT Start Time 0807   PT Stop Time 0904   PT Time Calculation (min) 57 min   Activity Tolerance Patient tolerated treatment well   Behavior During Therapy [facility] for tasks assessed/performed      Past Medical History  Diagnosis Date  . Headache(784.0)     Migraines  . Leukopenia   . Depression   . Multiple myeloma, without mention of having achieved remission 07/13/2012  . Leukopenia 07/13/2012    Past Surgical History  Procedure Laterality Date  . Laparoscopic endometriosis fulguration  1979  . Cataract extraction  09/2007  . Basal cell carcinoma excision  2013    removed from nose    There were no vitals filed for this visit.  Visit Diagnosis:  Cervical pain (neck)  Stiffness of cervical spine      Subjective Assessment - 10/25/14 0810    Subjective Patient states PT HEP and positional stretch over towel padded rolling pin (recommended by chiropracter) have been managing pain well and preventing it from "spiking into her head", with pain typically no more than 1-2/10.   Currently in Pain? No/denies            [facility] PT Assessment - 10/25/14 1564    Observation/Other Assessments   Focus on Therapeutic Outcomes (FOTO)  72% (28% limitation)   ROM / Strength   AROM / PROM / Strength AROM   AROM   Overall AROM Comments Bilateral shoulder ROM WFL/WNL   AROM Assessment Site Cervical   Cervical Flexion 46   Cervical Extension 51   Cervical - Right Side Bend 26   Cervical - Left Side Bend 26   Cervical - Right Rotation 58   Cervical - Left Rotation 61   Strength   Overall Strength Within functional limits for tasks performed  Bilateral UE                     [facility] Adult PT Treatment/Exercise - 10/25/14 0807    Exercises   Exercises Neck   Neck Exercises: Machines for Strengthening   UBE (Upper Arm Bike) level 3 3'/3' fwd/bwd   Neck Exercises: Supine   Cervical Isometrics Right lateral flexion;Left lateral flexion;Right rotation;Left rotation;5 secs;5 reps   Capital Flexion 10 reps;3 secs   X to V 15 reps;Weight   X to V Weights (lbs) 3#   X to V Limitations on 6" foam roll   Shoulder Flexion Both;15 reps;Weights   Shoulder Flexion Weights (lbs) 5#   Shoulder Flexion Limitations pullover on 6" foam roll   Neck Exercises: Sidelying   Lateral Flexion Both;10 reps   Neck Exercises: Prone   W Back 10 reps   W Back Limitations bilateral with cervical extension   Shoulder Extension 10 reps   Shoulder Extension Limitations bilateral with cervical extension   Modalities   Modalities Traction   Traction   Type of Traction Cervical   Min (lbs) 7   Max (lbs) 14   Hold Time 60   Rest Time 20  Time 26' at a 10 degree pull                  PT Short Term Goals - 09/27/14 1252    PT SHORT TERM GOAL #1   Title Independent with initial HEP (09/28/14)   Status Achieved           PT Long Term Goals - 10/25/14 9147    PT LONG TERM GOAL #1   Title Independent with advanced HEP (10/26/14)   Status Achieved   PT LONG TERM GOAL #2   Title Cervical ROM to >/= 75% of normal range without pain (10/26/14)   Status Achieved   PT LONG TERM GOAL #3   Title Patient without sleep disturbances related to cervical/neck pain (10/26/14)   Status Achieved   PT LONG TERM GOAL #4   Title Patient able to perform overhead activities without increased neck pain   Status Achieved               Plan - 10/25/14 0847    Clinical Impression Statement Patient has been able to adequately manage neck pain to 1-2/10 at most  utilizing PT HEP and positional stretch over towel padded rolling pin (recommended by chiropracter). Cervical ROM WFL without pain and patient able to sleep and perform all daily activities including overhead activities without pain interference. Patient is independent with HEP and feels comfortable continuing on her own with home stretches and exercises. As such, will place therapy on hold for 30 days with patient to call to schedule appt if issues arise. If no new needs arise within 30 days, will proceed with discharge from PT.   PT Next Visit Plan Pt to call for appt if issues arise   Consulted and Agree with Plan of Care Patient        Problem List Patient Active Problem List   Diagnosis Date Noted  . Neck pain 08/28/2014  . Multiple myeloma 07/13/2012  . Leukopenia 07/13/2012  . Right lateral epicondylitis 01/09/2012  . Major depressive disorder, recurrent episode, moderate 10/02/2011  . Depression, acute 02/04/2011  . OSTEOPOROSIS 01/08/2009  . Migraine 12/29/2008    Percival Spanish, PT, MPT 10/25/2014, 9:13 AM  [facility] [address]  [place], [place], [address] Phone: [phone]   Fax:  [phone]     PHYSICAL THERAPY DISCHARGE SUMMARY  Visits from Start of Care: 14  Current functional level related to goals / functional outcomes:  As of last visit, the patient had been able to adequately manage neck pain to 1-2/10 at most utilizing PT HEP and positional stretch over towel padded rolling pin (recommended by chiropracter). Cervical ROM was [facility] without pain and patient was able to sleep and perform all daily activities including overhead activities without pain interference. Patient was independent with HEP and felt comfortable continuing on her own with home stretches and exercises. All PT goals were met.  Therapy was placed on hold x 30 days and patient has not called for an appt to return within that  timeframe, therefore will proceed with discharge from PT for this episode.   Remaining deficits:  None   Education / Equipment:  HEP  Plan: Patient agrees to discharge.  Patient goals were met. Patient is being discharged due to meeting the stated rehab goals.  ?????       Percival Spanish, PT, MPT 11/24/2014, 8:36 AM  [facility]  [place], [place], [address] Phone: [phone]   Fax:  [phone]

## 2014-11-07 ENCOUNTER — Other Ambulatory Visit (HOSPITAL_BASED_OUTPATIENT_CLINIC_OR_DEPARTMENT_OTHER): Payer: 59

## 2014-11-07 ENCOUNTER — Ambulatory Visit (HOSPITAL_BASED_OUTPATIENT_CLINIC_OR_DEPARTMENT_OTHER): Payer: 59 | Admitting: Family

## 2014-11-07 ENCOUNTER — Encounter: Payer: Self-pay | Admitting: Family

## 2014-11-07 VITALS — BP 120/78 | HR 101 | Temp 97.3°F | Resp 16 | Ht 69.0 in

## 2014-11-07 DIAGNOSIS — C9000 Multiple myeloma not having achieved remission: Secondary | ICD-10-CM

## 2014-11-07 DIAGNOSIS — D72819 Decreased white blood cell count, unspecified: Secondary | ICD-10-CM | POA: Diagnosis not present

## 2014-11-07 LAB — COMPREHENSIVE METABOLIC PANEL
ALT: 13 U/L (ref 6–29)
AST: 15 U/L (ref 10–35)
Albumin: 4.5 g/dL (ref 3.6–5.1)
Alkaline Phosphatase: 89 U/L (ref 33–130)
BUN: 8 mg/dL (ref 7–25)
CHLORIDE: 103 mmol/L (ref 98–110)
CO2: 27 mmol/L (ref 20–31)
CREATININE: 0.77 mg/dL (ref 0.50–0.99)
Calcium: 9.4 mg/dL (ref 8.6–10.4)
GLUCOSE: 96 mg/dL (ref 65–99)
POTASSIUM: 3.9 mmol/L (ref 3.5–5.3)
SODIUM: 140 mmol/L (ref 135–146)
Total Bilirubin: 0.6 mg/dL (ref 0.2–1.2)
Total Protein: 7.1 g/dL (ref 6.1–8.1)

## 2014-11-07 LAB — CBC WITH DIFFERENTIAL (CANCER CENTER ONLY)
BASO#: 0 10*3/uL (ref 0.0–0.2)
BASO%: 0.4 % (ref 0.0–2.0)
EOS%: 0.4 % (ref 0.0–7.0)
Eosinophils Absolute: 0 10*3/uL (ref 0.0–0.5)
HCT: 37.9 % (ref 34.8–46.6)
HGB: 13 g/dL (ref 11.6–15.9)
LYMPH#: 1.4 10*3/uL (ref 0.9–3.3)
LYMPH%: 57.8 % — AB (ref 14.0–48.0)
MCH: 32.7 pg (ref 26.0–34.0)
MCHC: 34.3 g/dL (ref 32.0–36.0)
MCV: 96 fL (ref 81–101)
MONO#: 0.1 10*3/uL (ref 0.1–0.9)
MONO%: 5.3 % (ref 0.0–13.0)
NEUT#: 0.9 10*3/uL — ABNORMAL LOW (ref 1.5–6.5)
NEUT%: 36.1 % — ABNORMAL LOW (ref 39.6–80.0)
Platelets: 297 10*3/uL (ref 145–400)
RBC: 3.97 10*6/uL (ref 3.70–5.32)
RDW: 12.6 % (ref 11.1–15.7)
WBC: 2.4 10*3/uL — ABNORMAL LOW (ref 3.9–10.0)

## 2014-11-07 NOTE — Progress Notes (Signed)
Hematology and Oncology Follow Up Visit  Nancy Parks [id number] 1950-08-27 64 y.o. 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 She has had a rough summer. Unfortunately in July her [facility] went out for over a month. The heat exacerbated her migraines and she has been working to get these back under control. They are improving. She states that she is now on Toprol daily.  In April, her M-spike was 0.6 g/dl, lamba light chain was 1.03 mg/dl and IgG was 1230 mg/dl. We did recheck these today.  She has had no issue with infections. No fever, chills, n/v, cough, rash, chest pain, palpitations, abdominal pain. No changes in her bowel or bladder habits.  She does become SOB with exertion. Despite this she has still stayed active. She does water aerobics twice a week.  She is monitoring her diet and slowly cutting out salt and sugar. She is focusing on trying to lose some weight. She is eating much healthier and staying hydrated. Her weight is down 3 lbs since her last visit.   Medications:    Medication List       This list is accurate as of: 11/07/14  1:34 PM.  Always use your most recent med list.               amitriptyline 50 MG tablet  Commonly known as:  ELAVIL  TAKE 1 TABLET(S) BY MOUTH EVERY NIGHT AT BEDTIME     CALCIUM & MAGNESIUM CARBONATES PO  Take 4 capsules by mouth.     IMITREX 100 MG tablet  Generic drug:  SUMAtriptan  Take 100 mg by mouth as needed for migraine.     INDERAL LA 80 MG 24 hr capsule  Generic drug:  propranolol ER  Take 80 mg by mouth.     magnesium (amino acid chelate) 133 MG tablet  Take 1 tablet by mouth 2 (two) times daily. 1000 mg bid     NON FORMULARY  Take by mouth at bedtime. TRIPHALA     Turmeric Curcumin Caps  Take 150 capsules by mouth.        Allergies:    Allergies  Allergen Reactions  . Latex Rash    REACTION: Rash REACTION: Rash REACTION: Rash  . Tape Rash    Past Medical History, Surgical history, Social history, and Family History were reviewed and updated.  Review of Systems: All other 10 point review of systems is negative.   Physical Exam:  vitals were not taken for this visit.  Wt Readings from Last 3 Encounters:  09/28/14 176 lb (79.833 kg)  08/28/14 179 lb (81.194 kg)  06/08/14 181 lb (82.101 kg)    Ocular: Sclerae unicteric, pupils equal, round and reactive to light Ear-nose-throat: Oropharynx clear, dentition fair Lymphatic: No cervical or supraclavicular adenopathy Lungs no rales or rhonchi, good excursion bilaterally Heart regular rate and rhythm, no murmur appreciated Abd soft, nontender, positive bowel sounds MSK no focal spinal tenderness, no joint edema Neuro: non-focal, well-oriented, appropriate affect Breasts: Deferred   Lab Results  Component Value Date   WBC 2.4* 11/07/2014   HGB 13.0 11/07/2014   HCT 37.9 11/07/2014   MCV 96 11/07/2014   PLT 297 11/07/2014   Lab Results  Component Value Date   FERRITIN 450* 09/21/2012   Lab Results  Component Value Date  RETICCTPCT 1.3 06/04/2012   RBC 3.97 11/07/2014   RETICCTABS 46.0 06/04/2012   Lab Results  Component Value Date   KPAFRELGTCHN 1.21 06/08/2014   LAMBDASER 1.18 06/08/2014   KAPLAMBRATIO 1.03 06/08/2014   Lab Results  Component Value Date   IGGSERUM 1230 06/08/2014   IGA 62* 06/08/2014   IGMSERUM 91 06/08/2014   Lab Results  Component Value Date   TOTALPROTELP 6.4 06/08/2014   ALBUMINELP 4.0 06/08/2014   A1GS 0.2 06/08/2014   A2GS 0.6 06/08/2014   BETS 0.3* 06/08/2014   BETA2SER 0.2 06/08/2014   GAMS 1.0 06/08/2014   MSPIKE 0.80 01/19/2014   SPEI * 06/08/2014     Chemistry      Component Value Date/Time   NA 136 06/08/2014 1518   NA 144 01/19/2014 0908   K 3.9 06/08/2014 1518   K 4.1 01/19/2014 0908   CL 102  06/08/2014 1518   CL 104 01/19/2014 0908   CO2 27 06/08/2014 1518   CO2 29 01/19/2014 0908   BUN 9 06/08/2014 1518   BUN 11 01/19/2014 0908   CREATININE 0.75 06/08/2014 1518   CREATININE 1.0 01/19/2014 0908      Component Value Date/Time   CALCIUM 8.7 06/08/2014 1518   CALCIUM 9.1 01/19/2014 0908   ALKPHOS 106 06/08/2014 1518   ALKPHOS 113* 01/19/2014 0908   AST 23 06/08/2014 1518   AST 24 01/19/2014 0908   ALT 25 06/08/2014 1518   ALT 25 01/19/2014 0908   BILITOT 0.3 06/08/2014 1518   BILITOT 0.80 01/19/2014 0908     Impression and Plan: Nancy Parks is 40 yowhite female with a smoldering IgG lambda myeloma. So far she has done well and is asymptomatic at this time.  Her myeloma studies in April remained stable.  We will see how her studies from today are. The results are pending.  We will continue to follow along with her and plan to see her back in 4 months for labs and follow-up.  She knows to contact us with any questions or concerns. We can certainly see her sooner if need be.   Eliezer Bottom, NP 9/20/20161:34 PM

## 2014-11-09 LAB — SPEP & IFE WITH QIG
ALBUMIN ELP: 4.4 g/dL (ref 3.8–4.8)
ALPHA-1-GLOBULIN: 0.2 g/dL (ref 0.2–0.3)
Abnormal Protein Band1: 0.8 g/dL
Alpha-2-Globulin: 0.6 g/dL (ref 0.5–0.9)
BETA 2: 0.3 g/dL (ref 0.2–0.5)
Beta Globulin: 0.4 g/dL (ref 0.4–0.6)
GAMMA GLOBULIN: 1.1 g/dL (ref 0.8–1.7)
IGA: 71 mg/dL (ref 69–380)
IGG (IMMUNOGLOBIN G), SERUM: 1270 mg/dL (ref 690–1700)
IGM, SERUM: 102 mg/dL (ref 52–322)
TOTAL PROTEIN, SERUM ELECTROPHOR: 7 g/dL (ref 6.1–8.1)

## 2014-11-09 LAB — KAPPA/LAMBDA LIGHT CHAINS
KAPPA FREE LGHT CHN: 1.05 mg/dL (ref 0.33–1.94)
Kappa:Lambda Ratio: 0.91 (ref 0.26–1.65)
LAMBDA FREE LGHT CHN: 1.15 mg/dL (ref 0.57–2.63)

## 2015-02-28 ENCOUNTER — Ambulatory Visit (HOSPITAL_BASED_OUTPATIENT_CLINIC_OR_DEPARTMENT_OTHER): Payer: BLUE CROSS/BLUE SHIELD | Admitting: Hematology & Oncology

## 2015-02-28 ENCOUNTER — Other Ambulatory Visit (HOSPITAL_BASED_OUTPATIENT_CLINIC_OR_DEPARTMENT_OTHER): Payer: BLUE CROSS/BLUE SHIELD

## 2015-02-28 ENCOUNTER — Encounter: Payer: Self-pay | Admitting: Hematology & Oncology

## 2015-02-28 VITALS — BP 123/82 | HR 75 | Temp 98.0°F | Resp 16 | Ht 69.0 in | Wt 189.0 lb

## 2015-02-28 DIAGNOSIS — C9000 Multiple myeloma not having achieved remission: Secondary | ICD-10-CM

## 2015-02-28 DIAGNOSIS — D72819 Decreased white blood cell count, unspecified: Secondary | ICD-10-CM | POA: Diagnosis not present

## 2015-02-28 DIAGNOSIS — Z1239 Encounter for other screening for malignant neoplasm of breast: Secondary | ICD-10-CM

## 2015-02-28 LAB — CBC WITH DIFFERENTIAL (CANCER CENTER ONLY)
BASO#: 0 10*3/uL (ref 0.0–0.2)
BASO%: 0.4 % (ref 0.0–2.0)
EOS%: 0 % (ref 0.0–7.0)
Eosinophils Absolute: 0 10*3/uL (ref 0.0–0.5)
HEMATOCRIT: 38.3 % (ref 34.8–46.6)
HEMOGLOBIN: 12.9 g/dL (ref 11.6–15.9)
LYMPH#: 1.7 10*3/uL (ref 0.9–3.3)
LYMPH%: 60.2 % — ABNORMAL HIGH (ref 14.0–48.0)
MCH: 32.3 pg (ref 26.0–34.0)
MCHC: 33.7 g/dL (ref 32.0–36.0)
MCV: 96 fL (ref 81–101)
MONO#: 0.1 10*3/uL (ref 0.1–0.9)
MONO%: 4.7 % (ref 0.0–13.0)
NEUT%: 34.7 % — ABNORMAL LOW (ref 39.6–80.0)
NEUTROS ABS: 1 10*3/uL — AB (ref 1.5–6.5)
Platelets: 402 10*3/uL — ABNORMAL HIGH (ref 145–400)
RBC: 4 10*6/uL (ref 3.70–5.32)
RDW: 13.1 % (ref 11.1–15.7)
WBC: 2.8 10*3/uL — ABNORMAL LOW (ref 3.9–10.0)

## 2015-02-28 NOTE — Progress Notes (Signed)
Hematology and Oncology Follow Up Visit  Nancy Parks [id number] 09-03-1950 65 y.o. 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  Parks is back for followup. We last saw her back in the fall. She's been doing well.  She has not had a mammogram for 2 years. We really need to get this set up for her.  She has migraine issues. She sees a neurologist for this. She is on some vitamin supplements.  Her last myeloma studies that we did on her back in September, showed a monoclonal spike of 0.7 g/dL. Her IgG level was 1270 mg/dL and her lambda light chain is 1.15 mg/dL.  She's had no bone pain. Patient had no issues with infections.  His been no change in bowel or bladder habits.  She's had no rashes. There's been no leg swelling.  Her performance status is ECOG 0.     Medications:  Current outpatient prescriptions:  .  amitriptyline (ELAVIL) 50 MG tablet, TAKE 1 TABLET(S) BY MOUTH EVERY NIGHT AT BEDTIME, Disp: 90 tablet, Rfl: 1 .  CALCIUM & MAGNESIUM CARBONATES PO, Take 4 capsules by mouth., Disp: , Rfl:  .  Misc Natural Products (TURMERIC CURCUMIN) CAPS, Take 150 capsules by mouth., Disp: , Rfl:  .  NON FORMULARY, Take by mouth at bedtime. TRIPHALA, Disp: , Rfl:  .  Omega-3 Fatty Acids (FISH OIL) 1000 MG CAPS, Take by mouth., Disp: , Rfl:  .  propranolol ER (INDERAL LA) 80 MG 24 hr capsule, Take 80 mg by mouth., Disp: , Rfl:  .  Specialty Vitamins Products (MAGNESIUM, AMINO ACID CHELATE,) 133 MG tablet, Take 1 tablet by mouth 2 (two) times daily. 1000 mg bid, Disp: , Rfl:  .  SUMAtriptan (IMITREX) 100 MG tablet, Take 100 mg by mouth as needed for migraine., Disp: , Rfl:   Allergies:  Allergies  Allergen Reactions  . Latex Rash    REACTION: Rash REACTION: Rash REACTION: Rash  . Tape Rash    Past Medical History, Surgical history,  Social history, and Family History were reviewed and updated.  Review of Systems: As above  Physical Exam:  height is 5\' 9"  (1.753 m) and weight is 189 lb (85.73 kg). Her oral temperature is 98 F (36.7 C). Her blood pressure is 123/82 and her pulse is 75. Her respiration is 16.   Lungs are clear. Cardiac exam regular rate and rhythm. She has no murmurs rubs or bruits. Abdomen is soft. She has good bowel sounds. There is no fluid wave. There is no palpable liver or spleen tip. Back exam no tenderness over the spine ribs or hips. Extremities shows no clubbing cyanosis or edema. Has good range of motion of her joints. Has good muscle strength. Skin exam shows no rashes ecchymosis or petechia. Neurological exam no focal neurological deficits. Head and neck exam shows no adenopathy in the neck. Thyroid is nonpalpable. No ocular or oral lesions are noted  Lab Results  Component Value Date   WBC 2.8* 02/28/2015   HGB 12.9 02/28/2015   HCT 38.3 02/28/2015   MCV 96 02/28/2015   PLT 402* 02/28/2015     Chemistry      Component Value Date/Time   NA 140 11/07/2014 1311   NA 144 01/19/2014 0908   K 3.9 11/07/2014 1311   K 4.1 01/19/2014 0908   CL  103 11/07/2014 1311   CL 104 01/19/2014 0908   CO2 27 11/07/2014 1311   CO2 29 01/19/2014 0908   BUN 8 11/07/2014 1311   BUN 11 01/19/2014 0908   CREATININE 0.77 11/07/2014 1311   CREATININE 1.0 01/19/2014 0908      Component Value Date/Time   CALCIUM 9.4 11/07/2014 1311   CALCIUM 9.1 01/19/2014 0908   ALKPHOS 89 11/07/2014 1311   ALKPHOS 113* 01/19/2014 0908   AST 15 11/07/2014 1311   AST 24 01/19/2014 0908   ALT 13 11/07/2014 1311   ALT 25 01/19/2014 0908   BILITOT 0.6 11/07/2014 1311   BILITOT 0.80 01/19/2014 0908       Impression and Plan: Nancy Parks is 65 year old white female. She has IgG lambda smoldering myeloma. She is looking quite good right now.   Again, her myeloma studies have trended upward slowly. She is asymptomatic..  We will just follow this along.  I looked at her blood smear. Her white cell count is down a little bit. I do not see any immature myeloid cells. She has decent neutrophils. She has mature appearing lymphocytes.  I think that we can get her back in another 6 months. She's done incredibly well. I don't see any need for any additional studies on her.   [name], MD 1/11/20173:41 PM

## 2015-03-01 LAB — COMPREHENSIVE METABOLIC PANEL (CC13)
ALT: 20 IU/L (ref 0–32)
AST: 15 IU/L (ref 0–40)
Albumin, Serum: 4.3 g/dL (ref 3.6–4.8)
Albumin/Globulin Ratio: 1.7 (ref 1.1–2.5)
Alkaline Phosphatase, S: 114 IU/L (ref 39–117)
BUN/Creatinine Ratio: 13 (ref 11–26)
BUN: 11 mg/dL (ref 8–27)
Bilirubin Total: 0.3 mg/dL (ref 0.0–1.2)
CALCIUM: 9.1 mg/dL (ref 8.7–10.3)
CO2: 29 mmol/L (ref 18–29)
CREATININE: 0.82 mg/dL (ref 0.57–1.00)
Chloride, Ser: 103 mmol/L (ref 96–106)
GFR, EST AFRICAN AMERICAN: 87 mL/min/{1.73_m2} (ref >59)
GFR, EST NON AFRICAN AMERICAN: 76 mL/min/{1.73_m2} (ref >59)
GLUCOSE: 92 mg/dL (ref 65–99)
Globulin, Total: 2.6 g/dL (ref 1.5–4.5)
POTASSIUM: 4 mmol/L (ref 3.5–5.2)
Sodium: 138 mmol/L (ref 134–144)
TOTAL PROTEIN: 6.9 g/dL (ref 6.0–8.5)

## 2015-03-02 LAB — MULTIPLE MYELOMA PANEL, SERUM
ALBUMIN/GLOB SERPL: 1.3 (ref 0.7–1.7)
ALPHA 1: 0.1 g/dL (ref 0.0–0.4)
ALPHA2 GLOB SERPL ELPH-MCNC: 0.6 g/dL (ref 0.4–1.0)
Albumin SerPl Elph-Mcnc: 3.7 g/dL (ref 2.9–4.4)
B-GLOBULIN SERPL ELPH-MCNC: 0.9 g/dL (ref 0.7–1.3)
Gamma Glob SerPl Elph-Mcnc: 1.2 g/dL (ref 0.4–1.8)
Globulin, Total: 2.9 g/dL (ref 2.2–3.9)
IgA, Qn, Serum: 63 mg/dL — ABNORMAL LOW (ref 87–352)
IgM, Qn, Serum: 79 mg/dL (ref 26–217)
M PROTEIN SERPL ELPH-MCNC: 0.8 g/dL — AB
TOTAL PROTEIN: 6.6 g/dL (ref 6.0–8.5)

## 2015-03-02 LAB — KAPPA/LAMBDA LIGHT CHAINS
Ig Kappa Free Light Chain: 12.59 mg/L (ref 3.30–19.40)
Ig Lambda Free Light Chain: 12.92 mg/L (ref 5.71–26.30)
KAPPA/LAMBDA FLC RATIO: 0.97 (ref 0.26–1.65)

## 2015-03-13 ENCOUNTER — Ambulatory Visit (HOSPITAL_BASED_OUTPATIENT_CLINIC_OR_DEPARTMENT_OTHER)
Admission: RE | Admit: 2015-03-13 | Discharge: 2015-03-13 | Disposition: A | Discharge status: Home / Self Care | Payer: BLUE CROSS/BLUE SHIELD | Source: Ambulatory Visit | Attending: Hematology & Oncology | Admitting: Hematology & Oncology

## 2015-03-13 DIAGNOSIS — Z1239 Encounter for other screening for malignant neoplasm of breast: Secondary | ICD-10-CM

## 2015-03-13 DIAGNOSIS — Z1231 Encounter for screening mammogram for malignant neoplasm of breast: Secondary | ICD-10-CM | POA: Diagnosis present

## 2015-03-20 ENCOUNTER — Emergency Department (HOSPITAL_BASED_OUTPATIENT_CLINIC_OR_DEPARTMENT_OTHER)
Admission: EM | Admit: 2015-03-20 | Discharge: 2015-03-21 | Disposition: A | Discharge status: Home / Self Care | Payer: BLUE CROSS/BLUE SHIELD | Attending: Emergency Medicine | Admitting: Emergency Medicine

## 2015-03-20 ENCOUNTER — Encounter (HOSPITAL_BASED_OUTPATIENT_CLINIC_OR_DEPARTMENT_OTHER): Payer: Self-pay | Admitting: Emergency Medicine

## 2015-03-20 ENCOUNTER — Emergency Department (HOSPITAL_BASED_OUTPATIENT_CLINIC_OR_DEPARTMENT_OTHER): Payer: BLUE CROSS/BLUE SHIELD

## 2015-03-20 DIAGNOSIS — Z8679 Personal history of other diseases of the circulatory system: Secondary | ICD-10-CM | POA: Insufficient documentation

## 2015-03-20 DIAGNOSIS — R410 Disorientation, unspecified: Secondary | ICD-10-CM | POA: Insufficient documentation

## 2015-03-20 DIAGNOSIS — R4789 Other speech disturbances: Secondary | ICD-10-CM | POA: Insufficient documentation

## 2015-03-20 DIAGNOSIS — R51 Headache: Secondary | ICD-10-CM

## 2015-03-20 DIAGNOSIS — R509 Fever, unspecified: Secondary | ICD-10-CM

## 2015-03-20 DIAGNOSIS — Z91040 Latex allergy status: Secondary | ICD-10-CM | POA: Diagnosis not present

## 2015-03-20 DIAGNOSIS — Z79899 Other long term (current) drug therapy: Secondary | ICD-10-CM | POA: Insufficient documentation

## 2015-03-20 DIAGNOSIS — Z8579 Personal history of other malignant neoplasms of lymphoid, hematopoietic and related tissues: Secondary | ICD-10-CM | POA: Insufficient documentation

## 2015-03-20 DIAGNOSIS — J069 Acute upper respiratory infection, unspecified: Secondary | ICD-10-CM | POA: Insufficient documentation

## 2015-03-20 DIAGNOSIS — F329 Major depressive disorder, single episode, unspecified: Secondary | ICD-10-CM | POA: Diagnosis not present

## 2015-03-20 DIAGNOSIS — R519 Headache, unspecified: Secondary | ICD-10-CM

## 2015-03-20 DIAGNOSIS — Z862 Personal history of diseases of the blood and blood-forming organs and certain disorders involving the immune mechanism: Secondary | ICD-10-CM | POA: Diagnosis not present

## 2015-03-20 LAB — BASIC METABOLIC PANEL
ANION GAP: 7 (ref 5–15)
BUN: 11 mg/dL (ref 6–20)
CALCIUM: 8.5 mg/dL — AB (ref 8.9–10.3)
CO2: 24 mmol/L (ref 22–32)
CREATININE: 0.92 mg/dL (ref 0.44–1.00)
Chloride: 105 mmol/L (ref 101–111)
GFR calc Af Amer: >60 mL/min (ref >60)
GLUCOSE: 116 mg/dL — AB (ref 65–99)
Potassium: 4.3 mmol/L (ref 3.5–5.1)
Sodium: 136 mmol/L (ref 135–145)

## 2015-03-20 LAB — CBC WITH DIFFERENTIAL/PLATELET
BASOS ABS: 0 10*3/uL (ref 0.0–0.1)
BASOS PCT: 0 %
EOS ABS: 0 10*3/uL (ref 0.0–0.7)
EOS PCT: 0 %
HEMATOCRIT: 36.6 % (ref 36.0–46.0)
Hemoglobin: 12.3 g/dL (ref 12.0–15.0)
Lymphocytes Relative: 23 %
Lymphs Abs: 0.8 10*3/uL (ref 0.7–4.0)
MCH: 32.2 pg (ref 26.0–34.0)
MCHC: 33.6 g/dL (ref 30.0–36.0)
MCV: 95.8 fL (ref 78.0–100.0)
MONO ABS: 0.2 10*3/uL (ref 0.1–1.0)
MONOS PCT: 6 %
Neutro Abs: 2.5 10*3/uL (ref 1.7–7.7)
Neutrophils Relative %: 71 %
PLATELETS: 288 10*3/uL (ref 150–400)
RBC: 3.82 MIL/uL — ABNORMAL LOW (ref 3.87–5.11)
RDW: 13.3 % (ref 11.5–15.5)
WBC: 3.5 10*3/uL — ABNORMAL LOW (ref 4.0–10.5)

## 2015-03-20 MED ORDER — AZITHROMYCIN 250 MG PO TABS: ORAL_TABLET | ORAL | Status: DC

## 2015-03-20 MED ORDER — METOCLOPRAMIDE HCL 5 MG/ML IJ SOLN
10.0000 mg | Freq: Once | INTRAMUSCULAR | Status: AC
  Administered 2015-03-20: 10 mg via INTRAVENOUS
  Filled 2015-03-20: qty 2

## 2015-03-20 MED ORDER — KETOROLAC TROMETHAMINE 30 MG/ML IJ SOLN
30.0000 mg | Freq: Once | INTRAMUSCULAR | Status: AC
  Administered 2015-03-20: 30 mg via INTRAVENOUS
  Filled 2015-03-20: qty 1

## 2015-03-20 MED ORDER — DIPHENHYDRAMINE HCL 50 MG/ML IJ SOLN
25.0000 mg | Freq: Once | INTRAMUSCULAR | Status: AC
  Administered 2015-03-20: 25 mg via INTRAVENOUS
  Filled 2015-03-20: qty 1

## 2015-03-20 MED ORDER — SODIUM CHLORIDE 0.9 % IV BOLUS (SEPSIS)
1000.0000 mL | Freq: Once | INTRAVENOUS | Status: AC
  Administered 2015-03-20: 1000 mL via INTRAVENOUS

## 2015-03-20 MED ORDER — ACETAMINOPHEN 500 MG PO TABS
1000.0000 mg | ORAL_TABLET | Freq: Once | ORAL | Status: AC
  Administered 2015-03-20: 1000 mg via ORAL
  Filled 2015-03-20: qty 2

## 2015-03-20 MED ORDER — DEXAMETHASONE SODIUM PHOSPHATE 10 MG/ML IJ SOLN
10.0000 mg | Freq: Once | INTRAMUSCULAR | Status: AC
  Administered 2015-03-20: 10 mg via INTRAVENOUS
  Filled 2015-03-20: qty 1

## 2015-03-20 NOTE — ED Notes (Signed)
Pt returned from CT °

## 2015-03-20 NOTE — ED Notes (Signed)
Pt in c/o migraine last pm and new onset fever last pm with cough. Currently with multiple myeloma.

## 2015-03-20 NOTE — Discharge Instructions (Signed)
Zithromax as prescribed.  Return to the emergency department if symptoms significantly worsen or change.   Migraine Headache A migraine headache is an intense, throbbing pain on one or both sides of your head. A migraine can last for 30 minutes to several hours. CAUSES  The exact cause of a migraine headache is not always known. However, a migraine may be caused when nerves in the brain become irritated and release chemicals that cause inflammation. This causes pain. Certain things may also trigger migraines, such as:  Alcohol.  Smoking.  Stress.  Menstruation.  Aged cheeses.  Foods or drinks that contain nitrates, glutamate, aspartame, or tyramine.  Lack of sleep.  Chocolate.  Caffeine.  Hunger.  Physical exertion.  Fatigue.  Medicines used to treat chest pain (nitroglycerine), birth control pills, estrogen, and some blood pressure medicines. SIGNS AND SYMPTOMS  Pain on one or both sides of your head.  Pulsating or throbbing pain.  Severe pain that prevents daily activities.  Pain that is aggravated by any physical activity.  Nausea, vomiting, or both.  Dizziness.  Pain with exposure to bright lights, loud noises, or activity.  General sensitivity to bright lights, loud noises, or smells. Before you get a migraine, you may get warning signs that a migraine is coming (aura). An aura may include:  Seeing flashing lights.  Seeing bright spots, halos, or zigzag lines.  Having tunnel vision or blurred vision.  Having feelings of numbness or tingling.  Having trouble talking.  Having muscle weakness. DIAGNOSIS  A migraine headache is often diagnosed based on:  Symptoms.  Physical exam.  A CT scan or MRI of your head. These imaging tests cannot diagnose migraines, but they can help rule out other causes of headaches. TREATMENT Medicines may be given for pain and nausea. Medicines can also be given to help prevent recurrent migraines.  HOME CARE  INSTRUCTIONS  Only take over-the-counter or prescription medicines for pain or discomfort as directed by your health care provider. The use of long-term narcotics is not recommended.  Lie down in a dark, quiet room when you have a migraine.  Keep a journal to find out what may trigger your migraine headaches. For example, write down:  What you eat and drink.  How much sleep you get.  Any change to your diet or medicines.  Limit alcohol consumption.  Quit smoking if you smoke.  Get 7-9 hours of sleep, or as recommended by your health care provider.  Limit stress.  Keep lights dim if bright lights bother you and make your migraines worse. SEEK IMMEDIATE MEDICAL CARE IF:   Your migraine becomes severe.  You have a fever.  You have a stiff neck.  You have vision loss.  You have muscular weakness or loss of muscle control.  You start losing your balance or have trouble walking.  You feel faint or pass out.  You have severe symptoms that are different from your first symptoms. MAKE SURE YOU:   Understand these instructions.  Will watch your condition.  Will get help right away if you are not doing well or get worse.   This information is not intended to replace advice given to you by your health care provider. Make sure you discuss any questions you have with your health care provider.   Document Released: 02/03/2005 Document Revised: 02/24/2014 Document Reviewed: 10/11/2012 Elsevier Interactive Patient Education [employer].

## 2015-03-20 NOTE — ED Notes (Signed)
Pt c/o headache and fever since this morning as well as generalized body aches.  She last had 400mg  ibuprofen at 1400 this afternoon.  No n/v/d

## 2015-03-20 NOTE — ED Notes (Signed)
Pt verbalizes understanding of d/c instructions and denies any further needs at this time. 

## 2015-03-20 NOTE — ED Notes (Signed)
Patient transported to CT 

## 2015-03-20 NOTE — ED Provider Notes (Signed)
CSN: [id number]     Arrival date & time 03/20/15  2032 History  By signing my name below, I, Hansel Feinstein, attest that this documentation has been prepared under the direction and in the presence of Veryl Speak, MD. Electronically Signed: Hansel Feinstein, ED Scribe. 03/20/2015. 9:44 PM.    Chief Complaint  Patient presents with  . Migraine  . Cough  . Fever   Patient is a 65 y.o. female presenting with migraines. The history is provided by the patient. No language interpreter was used.  Migraine This is a recurrent problem. The current episode started 2 days ago. The problem occurs constantly. The problem has not changed since onset.Associated symptoms include headaches. Pertinent negatives include no abdominal pain. Nothing aggravates the symptoms. Nothing relieves the symptoms. She has tried nothing for the symptoms. The treatment provided no relief.    HPI Comments: Nancy Parks is a 65 y.o. female with h/o HA, leukopenia, multiple myeloma who presents to the Emergency Department complaining of moderate, temporal, sharp HA onset last night with associated fever (Tmax 102) onset tonight, difficulty speaking and ambulating, transient confusion, cough, congestion. Pt also notes she feels dehydrated, but has increased her water intake today. She reports her mouth feels dry. She states h/o similar HA that are not accompanied by fever. She notes she has been seen in the ED for HA before. Pt states sick contact with people who have similar symptoms at a recent funeral. Pt is followed by oncology. She reports she called her doctor and was referred to the ED. NKDA. She denies diarrhea, emesis, abdominal pain, dysuria, hematuria, difficulty urinating.   Past Medical History  Diagnosis Date  . Headache(784.0)     Migraines  . Leukopenia   . Depression   . Multiple myeloma, without mention of having achieved remission 07/13/2012  . Leukopenia 07/13/2012   Past Surgical History  Procedure Laterality Date   . Laparoscopic endometriosis fulguration  1979  . Cataract extraction  09/2007  . Basal cell carcinoma excision  2013    removed from nose   Family History  Problem Relation Age of Onset  . Breast cancer      grandmother   . Colon cancer Father     colon adenoma  . Hyperlipidemia Sister   . Hypertension Sister   . Cancer Sister     throat, smoker  . Melanoma Sister    Social History  Substance Use Topics  . Smoking status: Never Smoker   . Smokeless tobacco: Never Used     Comment: never used tobacco  . Alcohol Use: No   OB History    No data available     Review of Systems  Constitutional: Positive for fever.  HENT: Positive for congestion.   Respiratory: Positive for cough.   Gastrointestinal: Negative for vomiting, abdominal pain and diarrhea.  Genitourinary: Negative for dysuria, hematuria and difficulty urinating.  Neurological: Positive for speech difficulty and headaches.  Psychiatric/Behavioral: Positive for confusion.  All other systems reviewed and are negative.  Allergies  Latex and Tape  Home Medications   Prior to Admission medications   Medication Sig Start Date End Date Taking? Authorizing Provider  amitriptyline (ELAVIL) 50 MG tablet TAKE 1 TABLET(S) BY MOUTH EVERY NIGHT AT BEDTIME 07/04/13   Hali Marry, MD  CALCIUM & MAGNESIUM CARBONATES PO Take 4 capsules by mouth.    Historical Provider, MD  Misc Natural Products (TURMERIC CURCUMIN) CAPS Take 150 capsules by mouth. 08/28/14   Historical Provider, MD  NON FORMULARY Take by mouth at bedtime. TRIPHALA    Historical Provider, MD  Omega-3 Fatty Acids (FISH OIL) 1000 MG CAPS Take by mouth.    Historical Provider, MD  propranolol ER (INDERAL LA) 80 MG 24 hr capsule Take 80 mg by mouth. 09/05/14 09/05/15  Historical Provider, MD  Specialty Vitamins Products (MAGNESIUM, AMINO ACID CHELATE,) 133 MG tablet Take 1 tablet by mouth 2 (two) times daily. 1000 mg bid    Historical Provider, MD  SUMAtriptan  (IMITREX) 100 MG tablet Take 100 mg by mouth as needed for migraine.    Historical Provider, MD   BP 131/77 mmHg  Pulse 88  Temp(Src) 99 F (37.2 C) (Oral)  Resp 20  Ht 5' 10"  (1.778 m)  Wt 189 lb (85.73 kg)  BMI 27.12 kg/m2  SpO2 100% Physical Exam  Constitutional: She is oriented to person, place, and time. She appears well-developed and well-nourished. No distress.  HENT:  Head: Normocephalic and atraumatic.  Mouth/Throat: Oropharynx is clear and moist. No oropharyngeal exudate.  Eyes: EOM are normal.  Neck: Normal range of motion.  Cardiovascular: Normal rate, regular rhythm and normal heart sounds.  Exam reveals no gallop and no friction rub.   No murmur heard. Pulmonary/Chest: Effort normal and breath sounds normal. No respiratory distress. She has no wheezes. She has no rales.  Abdominal: Soft. She exhibits no distension and no mass. There is no tenderness. There is no rebound and no guarding.  Musculoskeletal: Normal range of motion.  Neurological: She is alert and oriented to person, place, and time. She has normal reflexes. She displays normal reflexes. No cranial nerve deficit. She exhibits normal muscle tone. Coordination normal.  Cranial nerves 2-12 intact.   Skin: Skin is warm and dry.  Psychiatric: She has a normal mood and affect. Judgment normal.  Nursing note and vitals reviewed.   ED Course  Procedures (including critical care time) DIAGNOSTIC STUDIES: Oxygen Saturation is 100% on RA, normal by my interpretation.    COORDINATION OF CARE: 9:41 PM Discussed treatment plan with pt at bedside and pt agreed to plan.   Labs Review Labs Reviewed  BASIC METABOLIC PANEL  CBC WITH DIFFERENTIAL/PLATELET    Imaging Review No results found. I have personally reviewed and evaluated these images and lab results as part of my medical decision-making.  MDM   Final diagnoses:  None    Patient is a 65 year old female with history of multiple myeloma. She presents  for evaluation of fever, congestion, and cough. She reports being around several other people who are ill in a similar fashion. Her friend who is present at bedside believe she is confused. Patient appears alert and oriented and is neurologically intact to my exam. She did undergo a CT scan of the head and laboratory studies which were all essentially unremarkable. After a migraine cocktail and Tylenol for her fever she is now feeling significantly better. She will be discharged with Zithromax for bronchitis and advised to return as needed if her symptoms worsen or change.  I personally performed the services described in this documentation, which was scribed in my presence. The recorded information has been reviewed and is accurate.       Veryl Speak, MD 03/22/15 0300

## 2015-03-23 ENCOUNTER — Telehealth: Payer: Self-pay

## 2015-03-23 NOTE — Telephone Encounter (Signed)
As long as symptoms are continuing to improve we can just keep an eye on this.

## 2015-03-23 NOTE — Telephone Encounter (Signed)
Dr Madilyn Fireman patient. Nancy Parks states she was seen in the ED for headache and fever and treated with a Zpack for a sinus infection. She is feeling better, no fever, but she now has red swollen cheeks. She wanted to know if this is something to be alarmed about.

## 2015-03-23 NOTE — Telephone Encounter (Signed)
Patient advised.

## 2015-03-27 ENCOUNTER — Encounter: Payer: Self-pay | Admitting: Physician Assistant

## 2015-03-27 ENCOUNTER — Ambulatory Visit (INDEPENDENT_AMBULATORY_CARE_PROVIDER_SITE_OTHER): Payer: BLUE CROSS/BLUE SHIELD | Admitting: Physician Assistant

## 2015-03-27 VITALS — BP 127/79 | HR 93 | Temp 97.3°F | Ht 70.0 in | Wt 189.0 lb

## 2015-03-27 DIAGNOSIS — R220 Localized swelling, mass and lump, head: Secondary | ICD-10-CM | POA: Diagnosis not present

## 2015-03-27 DIAGNOSIS — B001 Herpesviral vesicular dermatitis: Secondary | ICD-10-CM

## 2015-03-27 MED ORDER — PREDNISONE 50 MG PO TABS: ORAL_TABLET | ORAL | Status: DC

## 2015-03-27 NOTE — Progress Notes (Signed)
   Subjective:    Patient ID: Nancy Parks, female    DOB: [birth year], 65 y.o.   MRN: [id number]  HPI  Pt is a 65 yo female who presents to the clinic with right sided swollen face for 4 days. Pt does have multiple myeloma. 4 days ago she noticed for the first time. She took her dose of zpak and improved. She took last dose of zpak on Sunday, 3 days ago after ER visit for headache and dx with sinusitis. No tenderness over face. She did notice any time she sucks on something like lemon or candy she gets a shooting pain in right cheek. Resolved after spitting out candy. No fever, chills, body aches, ear pain, ST. She also has a painful sore on lower lip that feels a little better.      Review of Systems    see HPI.  Objective:   Physical Exam  Constitutional: She appears well-developed and well-nourished.  HENT:  Head: Normocephalic and atraumatic.  Right Ear: External ear normal.  Left Ear: External ear normal.  Nose: Nose normal.  Mouth/Throat: Oropharynx is clear and moist. No oropharyngeal exudate.  TM's clear.   Slight noticeable swelling of right side of face. No erythema. No tenderness or nodules felt of buccal mucosa to palpation.   Some mild tenderness to palpation over right maxillary sinus.     Eyes: Conjunctivae are normal. Right eye exhibits no discharge. Left eye exhibits no discharge.  Neck: Normal range of motion. Neck supple.  Cardiovascular: Normal rate, regular rhythm and normal heart sounds.   Pulmonary/Chest: Effort normal and breath sounds normal. She has no wheezes.  Lymphadenopathy:    She has no cervical adenopathy.  Skin:     Psychiatric: She has a normal mood and affect. Her behavior is normal.          Assessment & Plan:  Right facial swelling- no signs of infection. Unclear etiology could be some inflammation from sinusitis resolving or potentially a salivary stone. zpak should have treated infection. Prednisone given to start. Warm compresses.  Ibuprofen as needed. Follow up if not improving or if symptoms changing.   Fever blister- appears to be resolving.

## 2015-03-27 NOTE — Patient Instructions (Signed)
Salivary Stone A salivary stone is a mineral deposit that builds up in the ducts that drain your salivary glands. Most salivary gland stones are made of calcium. When a stone forms, saliva can back up into the gland and cause painful swelling. Your salivary glands are the glands that produce spit (saliva). You have six major salivary glands. Each gland has a duct that carries saliva into your mouth. Saliva keeps your mouth moist and breaks down the food that you eat. It also helps to prevent tooth decay. Two salivary glands are located just in front of your ears (parotid). The ducts for these glands open up inside your cheeks, near your back teeth. You also have two glands under your tongue (sublingual) and two glands under your jaw (submandibular). The ducts for these glands open under your tongue. A stone can form in any salivary gland. The most common place for a salivary stone to develop is in a submandibular salivary gland. CAUSES Any condition that reduces the flow of saliva may lead to stone formation. It is not known why some people form stones and others do not.  RISK FACTORS You may be more likely to develop a salivary stone if you:  Are female.  Do not drink enough water.  Smoke.  Have high blood pressure.  Have gout.  Have diabetes. SIGNS AND SYMPTOMS The main sign of a salivary gland stone is sudden swelling of a salivary gland when eating. This usually happens under the jaw on one side. Other signs and symptoms include:  Swelling of the cheek or under the tongue when eating.  Pain in the swollen area.  Trouble chewing or swallowing.  Swelling that goes down after eating. DIAGNOSIS Your health care provider may diagnose a salivary gland stone based on your signs and symptoms. The health care provider will also do a physical exam. In many cases, a stone can be felt in a duct inside your mouth. You may need to see an ear, nose, and throat specialist (ENT or otolaryngologist)  for diagnosis and treatment. You may also need to have diagnostic tests. These may include imaging studies to check for a stone, such as:  X-rays.  Ultrasound.  CT scan.  MRI. TREATMENT Home care may be enough to treat a small stone that is not causing symptoms. Treatment of a stone that is large enough to cause symptoms may include:  Probing and widening the duct to allow the stone to pass.  Inserting a thin, flexible scope (endoscope) into the duct to locate and remove the stone.  Breaking up the stone with sound waves.  Removing the entire salivary gland. HOME CARE INSTRUCTIONS  Drink enough fluid to keep your urine clear or pale yellow.  Follow these instructions every few hours:  Suck on a lemon candy to stimulate the flow of saliva.  Put a hot compress over the gland.  Gently massage the gland.  Do not use any tobacco products, including cigarettes, chewing tobacco, or electronic cigarettes. If you need help quitting, ask your health care provider. SEEK MEDICAL CARE IF:  You have pain and swelling in your face, jaw, or mouth after eating.  You have persistent swelling in any of these places:  In front of your ear.  Under your jaw.  Inside your mouth. SEEK IMMEDIATE MEDICAL CARE IF:  You have pain and swelling in your face, jaw, or mouth that are getting worse.  Your pain and swelling make it hard to swallow or breathe.     This information is not intended to replace advice given to you by your health care provider. Make sure you discuss any questions you have with your health care provider.   Document Released: 03/13/2004 Document Revised: 02/24/2014 Document Reviewed: 07/06/2013 Elsevier Interactive Patient Education 2016 [employer] Inc[employer]  

## 2015-03-28 ENCOUNTER — Encounter: Payer: Self-pay | Admitting: Physician Assistant

## 2015-06-11 DIAGNOSIS — R69 Illness, unspecified: Secondary | ICD-10-CM | POA: Diagnosis not present

## 2015-08-29 ENCOUNTER — Other Ambulatory Visit (HOSPITAL_BASED_OUTPATIENT_CLINIC_OR_DEPARTMENT_OTHER): Payer: Medicare HMO

## 2015-08-29 ENCOUNTER — Ambulatory Visit (HOSPITAL_BASED_OUTPATIENT_CLINIC_OR_DEPARTMENT_OTHER): Payer: Medicare HMO | Admitting: Family

## 2015-08-29 ENCOUNTER — Encounter: Payer: Self-pay | Admitting: Family

## 2015-08-29 VITALS — BP 108/71 | HR 85 | Temp 98.2°F | Resp 18 | Ht 70.0 in | Wt 180.0 lb

## 2015-08-29 DIAGNOSIS — D72819 Decreased white blood cell count, unspecified: Secondary | ICD-10-CM

## 2015-08-29 DIAGNOSIS — C9000 Multiple myeloma not having achieved remission: Secondary | ICD-10-CM

## 2015-08-29 LAB — CBC WITH DIFFERENTIAL (CANCER CENTER ONLY)
BASO#: 0 10*3/uL (ref 0.0–0.2)
BASO%: 0.5 % (ref 0.0–2.0)
EOS%: 0 % (ref 0.0–7.0)
Eosinophils Absolute: 0 10*3/uL (ref 0.0–0.5)
HCT: 35.5 % (ref 34.8–46.6)
HGB: 12.1 g/dL (ref 11.6–15.9)
LYMPH#: 1.3 10*3/uL (ref 0.9–3.3)
LYMPH%: 60.6 % — AB (ref 14.0–48.0)
MCH: 33.6 pg (ref 26.0–34.0)
MCHC: 34.1 g/dL (ref 32.0–36.0)
MCV: 99 fL (ref 81–101)
MONO#: 0.1 10*3/uL (ref 0.1–0.9)
MONO%: 5.6 % (ref 0.0–13.0)
NEUT#: 0.7 10*3/uL — ABNORMAL LOW (ref 1.5–6.5)
NEUT%: 33.3 % — ABNORMAL LOW (ref 39.6–80.0)
PLATELETS: 292 10*3/uL (ref 145–400)
RBC: 3.6 10*6/uL — AB (ref 3.70–5.32)
RDW: 12.8 % (ref 11.1–15.7)
WBC: 2.1 10*3/uL — AB (ref 3.9–10.0)

## 2015-08-29 LAB — COMPREHENSIVE METABOLIC PANEL
ALT: 14 U/L (ref 0–55)
AST: 17 U/L (ref 5–34)
Albumin: 3.8 g/dL (ref 3.5–5.0)
Alkaline Phosphatase: 114 U/L (ref 40–150)
Anion Gap: 8 mEq/L (ref 3–11)
BILIRUBIN TOTAL: 0.47 mg/dL (ref 0.20–1.20)
BUN: 9.3 mg/dL (ref 7.0–26.0)
CO2: 24 meq/L (ref 22–29)
CREATININE: 0.8 mg/dL (ref 0.6–1.1)
Calcium: 8.9 mg/dL (ref 8.4–10.4)
Chloride: 108 mEq/L (ref 98–109)
EGFR: 75 mL/min/{1.73_m2} — ABNORMAL LOW (ref >90)
GLUCOSE: 95 mg/dL (ref 70–140)
Potassium: 3.9 mEq/L (ref 3.5–5.1)
SODIUM: 140 meq/L (ref 136–145)
TOTAL PROTEIN: 6.9 g/dL (ref 6.4–8.3)

## 2015-08-29 NOTE — Progress Notes (Signed)
Hematology and Oncology Follow Up Visit  Nancy Parks [id number] 1950-07-09 65 y.o. 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 She is doing quite well and has no complaints at this time. She has had no problem with frequent infections despite having a low WBC count.  No fatigue. She denies fever, chills, n/v, cough, rash, dizziness, chest pain, palpitations, abdominal pain. No changes in her bowel or bladder habits.  She hs some SOB with exertion which passes with taking a moment to rest.  In January, her M-spike was 0.8 g/dl, lamba light chain was 1.29 mg/dl and IgG was 1121 mg/dl.  No lymphadenopathy found on exam. No episodes of bleeding or bruising.  No swelling, tenderness, numbness or tingling in her extremities. No c.o joint aches or bone pain.  She is a vegetarian most days but occasionally eats fish. She has cut bread, sugar and other processed foods out of her and feels like she is much more alert and has not had a migraine since March.  She is staying active working out in her yard and doing water aerobics.   Medications:    Medication List       This list is accurate as of: 08/29/15  2:24 PM.  Always use your most recent med list.               amitriptyline 50 MG tablet  Commonly known as:  ELAVIL  TAKE 1 TABLET(S) BY MOUTH EVERY NIGHT AT BEDTIME     CALCIUM & MAGNESIUM CARBONATES PO  Take 4 capsules by mouth.     Fish Oil 1000 MG Caps  Take by mouth.     IMITREX 100 MG tablet  Generic drug:  SUMAtriptan  Take 100 mg by mouth as needed for migraine.     INDERAL LA 80 MG 24 hr capsule  Generic drug:  propranolol ER  Take 80 mg by mouth.     magnesium (amino acid chelate) 133 MG tablet  Take 1 tablet by mouth 2 (two) times daily. 1000 mg bid     NON FORMULARY  Take by mouth at  bedtime. TRIPHALA     predniSONE 50 MG tablet  Commonly known as:  DELTASONE  Take one tablet for 5 days.     Turmeric Curcumin Caps  Take 150 capsules by mouth.        Allergies:  Allergies  Allergen Reactions  . Latex Rash    REACTION: Rash REACTION: Rash REACTION: Rash  . Tape Rash    Past Medical History, Surgical history, Social history, and Family History were reviewed and updated.  Review of Systems: All other 10 point review of systems is negative.   Physical Exam:  vitals were not taken for this visit.  Wt Readings from Last 3 Encounters:  03/27/15 189 lb (85.73 kg)  03/20/15 189 lb (85.73 kg)  02/28/15 189 lb (85.73 kg)    Ocular: Sclerae unicteric, pupils equal, round and reactive to light Ear-nose-throat: Oropharynx clear, dentition fair Lymphatic: No cervical supraclavicular or axillary adenopathy Lungs no rales or rhonchi, good excursion bilaterally Heart regular rate and rhythm, no murmur appreciated Abd soft, nontender, positive bowel sounds, no liver or spleen tip palpated on exam  MSK no focal spinal tenderness, no joint edema Neuro: non-focal, well-oriented, appropriate affect Breasts: Deferred   Lab  Results  Component Value Date   WBC 2.1* 08/29/2015   HGB 12.1 08/29/2015   HCT 35.5 08/29/2015   MCV 99 08/29/2015   PLT 292 08/29/2015   Lab Results  Component Value Date   FERRITIN 450* 09/21/2012   Lab Results  Component Value Date   RETICCTPCT 1.3 06/04/2012   RBC 3.60* 08/29/2015   RETICCTABS 46.0 06/04/2012   Lab Results  Component Value Date   KPAFRELGTCHN 1.05 11/07/2014   LAMBDASER 1.15 11/07/2014   KAPLAMBRATIO 0.97 02/28/2015   Lab Results  Component Value Date   IGGSERUM 1,121 02/28/2015   IGA 71 11/07/2014   IGMSERUM 79 02/28/2015   Lab Results  Component Value Date   TOTALPROTELP 7.0 11/07/2014   ALBUMINELP 4.4 11/07/2014   A1GS 0.2 11/07/2014   A2GS 0.6 11/07/2014   BETS 0.4 11/07/2014   BETA2SER 0.3  11/07/2014   GAMS 1.1 11/07/2014   MSPIKE 0.80 01/19/2014   SPEI * 11/07/2014     Chemistry      Component Value Date/Time   NA 136 03/20/2015 2210   NA 138 02/28/2015 1523   NA 144 01/19/2014 0908   K 4.3 03/20/2015 2210   K 4.0 02/28/2015 1523   K 4.1 01/19/2014 0908   CL 105 03/20/2015 2210   CL 103 02/28/2015 1523   CL 104 01/19/2014 0908   CO2 24 03/20/2015 2210   CO2 29 02/28/2015 1523   CO2 29 01/19/2014 0908   BUN 11 03/20/2015 2210   BUN 11 02/28/2015 1523   BUN 11 01/19/2014 0908   CREATININE 0.92 03/20/2015 2210   CREATININE 0.82 02/28/2015 1523   CREATININE 1.0 01/19/2014 0908      Component Value Date/Time   CALCIUM 8.5* 03/20/2015 2210   CALCIUM 9.1 02/28/2015 1523   CALCIUM 9.1 01/19/2014 0908   ALKPHOS 114 02/28/2015 1523   ALKPHOS 89 11/07/2014 1311   ALKPHOS 113* 01/19/2014 0908   AST 15 02/28/2015 1523   AST 15 11/07/2014 1311   AST 24 01/19/2014 0908   ALT 20 02/28/2015 1523   ALT 13 11/07/2014 1311   ALT 25 01/19/2014 0908   BILITOT 0.3 02/28/2015 1523   BILITOT 0.6 11/07/2014 1311   BILITOT 0.80 01/19/2014 0908     Impression and Plan: Nancy Parks is 39 yowhite female with a smoldering IgG lambda myeloma. She continues to do quite well and is asymptomatic at this time.  We continue to monitor her myeloma studies. Today's are pending.  Her WBC is 2.1 but she has had no problem with infections.  We will see her back in 6 months for labs and follow-up.  She will contact our office with any questions or concerns. We can certainly see her sooner if need be.   Eliezer Bottom, NP 7/12/20172:24 PM

## 2015-08-30 ENCOUNTER — Ambulatory Visit (INDEPENDENT_AMBULATORY_CARE_PROVIDER_SITE_OTHER): Payer: Medicare HMO

## 2015-08-30 ENCOUNTER — Ambulatory Visit (INDEPENDENT_AMBULATORY_CARE_PROVIDER_SITE_OTHER): Payer: Medicare HMO | Admitting: Family Medicine

## 2015-08-30 VITALS — BP 119/76 | HR 98 | Wt 181.0 lb

## 2015-08-30 DIAGNOSIS — M25561 Pain in right knee: Secondary | ICD-10-CM

## 2015-08-30 DIAGNOSIS — M179 Osteoarthritis of knee, unspecified: Secondary | ICD-10-CM | POA: Diagnosis not present

## 2015-08-30 DIAGNOSIS — M25562 Pain in left knee: Secondary | ICD-10-CM

## 2015-08-30 LAB — KAPPA/LAMBDA LIGHT CHAINS
IG KAPPA FREE LIGHT CHAIN: 10.7 mg/L (ref 3.3–19.4)
IG LAMBDA FREE LIGHT CHAIN: 11.6 mg/L (ref 5.7–26.3)
Kappa/Lambda FluidC Ratio: 0.92 (ref 0.26–1.65)

## 2015-08-30 LAB — BETA 2 MICROGLOBULIN, SERUM: BETA 2: 2.2 mg/L (ref 0.6–2.4)

## 2015-08-30 NOTE — Progress Notes (Signed)
Nancy Parks is a 65 y.o. female who presents to [place] today for right knee pain. Patient is right posterior knee pain for the last week or 2. Pain is located in the posterior knee with knee flexion. She feels fullness. She denies any locking catching radiating pain weakness or numbness fevers or chills. She has not really tried any medications yet aside from turmeric which helps some.  She has a pertinent medical history significant for multiple myeloma currently smoldering. She denies any injury to explain her pain.   Past Medical History  Diagnosis Date  . Headache(784.0)     Migraines  . Leukopenia   . Depression   . Multiple myeloma, without mention of having achieved remission 07/13/2012  . Leukopenia 07/13/2012   Past Surgical History  Procedure Laterality Date  . Laparoscopic endometriosis fulguration  1979  . Cataract extraction  09/2007  . Basal cell carcinoma excision  2013    removed from nose   Social History  Substance Use Topics  . Smoking status: Never Smoker   . Smokeless tobacco: Never Used     Comment: never used tobacco  . Alcohol Use: No   family history includes Cancer in her sister; Colon cancer in her father; Hyperlipidemia in her sister; Hypertension in her sister; Melanoma in her sister.  ROS:  No headache, visual changes, nausea, vomiting, diarrhea, constipation, dizziness, abdominal pain, skin rash, fevers, chills, night sweats, weight loss, swollen lymph nodes, body aches, joint swelling, muscle aches, chest pain, shortness of breath, mood changes, visual or auditory hallucinations.    Medications: Current Outpatient Prescriptions  Medication Sig Dispense Refill  . amitriptyline (ELAVIL) 50 MG tablet TAKE 1 TABLET(S) BY MOUTH EVERY NIGHT AT BEDTIME 90 tablet 1  . CALCIUM & MAGNESIUM CARBONATES PO Take 4 capsules by mouth.    . Misc Natural Products (TURMERIC CURCUMIN) CAPS Take 150 capsules by mouth.     Marland Kitchen Specialty Vitamins Products (MAGNESIUM, AMINO ACID CHELATE,) 133 MG tablet Take 1 tablet by mouth 2 (two) times daily. 1000 mg bid    . SUMAtriptan (IMITREX) 100 MG tablet Take 100 mg by mouth as needed for migraine.     No current facility-administered medications for this visit.   Allergies  Allergen Reactions  . Latex Rash    REACTION: Rash REACTION: Rash REACTION: Rash  . Tape Rash     Exam:  BP 119/76 mmHg  Pulse 98  Wt 181 lb (82.101 kg) General: Well Developed, well nourished, and in no acute distress.  Neuro/Psych: Alert and oriented x3, extra-ocular muscles intact, able to move all 4 extremities, sensation grossly intact. Skin: Warm and dry, no rashes noted.  Respiratory: Not using accessory muscles, speaking in full sentences, trachea midline.  Cardiovascular: Pulses palpable, no extremity edema. Abdomen: Does not appear distended. MSK: Right knee: Normal-appearing no effusion. Nontender Normal motion with 1+ retropatellar crepitations on extension Stable ligamentous exam. Normal gait.   No results found for this or any previous visit (from the past 24 hour(s)). Dg Knee 1-2 Views Left  08/30/2015  CLINICAL DATA:  RIGHT knee pain for 1 week, no injury EXAM: LEFT KNEE - 1-2 VIEW COMPARISON:  None FINDINGS: Osseous demineralization. Medial compartment joint space narrowing and spur formation. Spurring at cranial margin of patella. No gross evidence of fracture, dislocation or bone destruction. IMPRESSION: Osseous demineralization with mild degenerative changes LEFT knee. Electronically Signed   By: Lavonia Dana M.D.   On: 08/30/2015 15:37  Dg Knee Complete 4 Views Right  08/30/2015  CLINICAL DATA:  RIGHT knee pain for 1 week, no injury EXAM: RIGHT KNEE - COMPLETE 4+ VIEW COMPARISON:  None FINDINGS: Osseous demineralization. Patellofemoral joint space narrowing and spur formation. No acute fracture, dislocation or bone destruction. No knee joint effusion. IMPRESSION:  Osseous demineralization with degenerative changes RIGHT knee primarily at patellofemoral joint. Electronically Signed   By: Lavonia Dana M.D.   On: 08/30/2015 15:38     Assessment and Plan: 65 y.o. female with knee pain possible Baker's cyst due to DJD. Patient has significant DJD. Discussed options. Plan for watchful waiting with over-the-counter medications and a compressive knee sleeve. Patient declines injection today. If not improved after few weeks she'll return to clinic in we will likely perform a steroid injection at that time.  X-ray shows no evidence of multiple myeloma lesions.

## 2015-08-30 NOTE — Patient Instructions (Signed)
Thank you for coming in today. Get x-ray. Use Tylenol ice and compression sleeve. Return if not improving in a few weeks.

## 2015-08-31 NOTE — Progress Notes (Signed)
Quick Note:  Knee xray shows arthritis but no other significant changes. ______

## 2015-09-03 LAB — MULTIPLE MYELOMA PANEL, SERUM
ALBUMIN/GLOB SERPL: 1.4 (ref 0.7–1.7)
ALPHA2 GLOB SERPL ELPH-MCNC: 0.5 g/dL (ref 0.4–1.0)
Albumin SerPl Elph-Mcnc: 3.7 g/dL (ref 2.9–4.4)
Alpha 1: 0.2 g/dL (ref 0.0–0.4)
B-Globulin SerPl Elph-Mcnc: 0.9 g/dL (ref 0.7–1.3)
Gamma Glob SerPl Elph-Mcnc: 1.2 g/dL (ref 0.4–1.8)
Globulin, Total: 2.7 g/dL (ref 2.2–3.9)
IGA/IMMUNOGLOBULIN A, SERUM: 53 mg/dL — AB (ref 87–352)
IGM (IMMUNOGLOBIN M), SRM: 76 mg/dL (ref 26–217)
M Protein SerPl Elph-Mcnc: 0.9 g/dL — ABNORMAL HIGH
Total Protein: 6.4 g/dL (ref 6.0–8.5)

## 2015-09-05 ENCOUNTER — Other Ambulatory Visit: Payer: Self-pay

## 2015-09-24 DIAGNOSIS — H524 Presbyopia: Secondary | ICD-10-CM | POA: Diagnosis not present

## 2015-10-01 ENCOUNTER — Other Ambulatory Visit (HOSPITAL_COMMUNITY)
Admission: RE | Admit: 2015-10-01 | Discharge: 2015-10-01 | Disposition: A | Discharge status: Home / Self Care | Payer: Medicare HMO | Source: Ambulatory Visit | Attending: Family Medicine | Admitting: Family Medicine

## 2015-10-01 ENCOUNTER — Ambulatory Visit (INDEPENDENT_AMBULATORY_CARE_PROVIDER_SITE_OTHER): Payer: Medicare HMO | Admitting: Family Medicine

## 2015-10-01 ENCOUNTER — Encounter: Payer: Self-pay | Admitting: Family Medicine

## 2015-10-01 VITALS — BP 131/73 | HR 72 | Resp 16 | Ht 70.0 in | Wt 181.0 lb

## 2015-10-01 DIAGNOSIS — Z01419 Encounter for gynecological examination (general) (routine) without abnormal findings: Secondary | ICD-10-CM | POA: Diagnosis present

## 2015-10-01 DIAGNOSIS — Z1159 Encounter for screening for other viral diseases: Secondary | ICD-10-CM

## 2015-10-01 DIAGNOSIS — Z1151 Encounter for screening for human papillomavirus (HPV): Secondary | ICD-10-CM | POA: Insufficient documentation

## 2015-10-01 DIAGNOSIS — Z13220 Encounter for screening for lipoid disorders: Secondary | ICD-10-CM

## 2015-10-01 DIAGNOSIS — Z0000 Encounter for general adult medical examination without abnormal findings: Secondary | ICD-10-CM | POA: Diagnosis not present

## 2015-10-01 DIAGNOSIS — C9000 Multiple myeloma not having achieved remission: Secondary | ICD-10-CM | POA: Diagnosis not present

## 2015-10-01 DIAGNOSIS — Z114 Encounter for screening for human immunodeficiency virus [HIV]: Secondary | ICD-10-CM

## 2015-10-01 DIAGNOSIS — Z79899 Other long term (current) drug therapy: Secondary | ICD-10-CM | POA: Diagnosis not present

## 2015-10-01 DIAGNOSIS — R69 Illness, unspecified: Secondary | ICD-10-CM | POA: Diagnosis not present

## 2015-10-01 LAB — LIPID PANEL
CHOLESTEROL: 196 mg/dL (ref 125–200)
HDL: 46 mg/dL (ref >=46)
LDL Cholesterol: 124 mg/dL (ref <130)
Total CHOL/HDL Ratio: 4.3 Ratio (ref <=5.0)
Triglycerides: 129 mg/dL (ref <150)
VLDL: 26 mg/dL (ref <30)

## 2015-10-01 LAB — COMPLETE METABOLIC PANEL WITH GFR
ALBUMIN: 4.2 g/dL (ref 3.6–5.1)
ALK PHOS: 105 U/L (ref 33–130)
ALT: 15 U/L (ref 6–29)
AST: 16 U/L (ref 10–35)
BILIRUBIN TOTAL: 0.5 mg/dL (ref 0.2–1.2)
BUN: 8 mg/dL (ref 7–25)
CALCIUM: 9.2 mg/dL (ref 8.6–10.4)
CO2: 26 mmol/L (ref 20–31)
Chloride: 106 mmol/L (ref 98–110)
Creat: 0.84 mg/dL (ref 0.50–0.99)
GFR, Est African American: 84 mL/min (ref >=60)
GFR, Est Non African American: 73 mL/min (ref >=60)
GLUCOSE: 95 mg/dL (ref 65–99)
POTASSIUM: 4.2 mmol/L (ref 3.5–5.3)
SODIUM: 141 mmol/L (ref 135–146)
TOTAL PROTEIN: 6.7 g/dL (ref 6.1–8.1)

## 2015-10-01 NOTE — Patient Instructions (Signed)
Keep up a regular exercise program and make sure you are eating a healthy diet Try to eat 4 servings of dairy a day, or if you are lactose intolerant take a calcium with vitamin D daily.  Your vaccines are up to date.   

## 2015-10-01 NOTE — Addendum Note (Signed)
Addended by: Teddy Spike on: 10/01/2015 12:00 PM   Modules accepted: Orders

## 2015-10-01 NOTE — Progress Notes (Addendum)
Subjective:   Nancy Parks is a 65 y.o. female who presents for an Initial Medicare Annual Wellness Visit.  Review of Systems    Comprehensive ROS is negative.         Objective:    Today's Vitals   10/01/15 0939  BP: 131/73  Pulse: 72  Resp: 16  SpO2: 99%  Weight: 181 lb (82.1 kg)  Height: 5' 10"  (1.778 m)   Body mass index is 25.97 kg/m.   Current Medications (verified) Outpatient Encounter Prescriptions as of 10/01/2015  Medication Sig  . amitriptyline (ELAVIL) 50 MG tablet TAKE 1 TABLET(S) BY MOUTH EVERY NIGHT AT BEDTIME  . CALCIUM & MAGNESIUM CARBONATES PO Take 4 capsules by mouth.  . Misc Natural Products (TURMERIC CURCUMIN) CAPS Take 150 capsules by mouth.  Marland Kitchen Specialty Vitamins Products (MAGNESIUM, AMINO ACID CHELATE,) 133 MG tablet Take 1 tablet by mouth 2 (two) times daily. 1000 mg bid  . SUMAtriptan (IMITREX) 100 MG tablet Take 100 mg by mouth as needed for migraine.   No facility-administered encounter medications on file as of 10/01/2015.     Allergies (verified) Latex and Tape   History: Past Medical History:  Diagnosis Date  . Depression   . Headache(784.0)    Migraines  . Leukopenia   . Leukopenia 07/13/2012  . Multiple myeloma, without mention of having achieved remission 07/13/2012   Past Surgical History:  Procedure Laterality Date  . BASAL CELL CARCINOMA EXCISION  2013   removed from nose  . CATARACT EXTRACTION  09/2007  . LAPAROSCOPIC ENDOMETRIOSIS FULGURATION  1979   Family History  Problem Relation Age of Onset  . Breast cancer      grandmother   . Colon cancer Father     colon adenoma  . Hyperlipidemia Sister   . Hypertension Sister   . Cancer Sister     throat, smoker  . Melanoma Sister    Social History   Occupational History  . 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 Smoking status: Never Smoker  . Smokeless tobacco: Never Used     Comment: never used tobacco  . Alcohol use No  .  Drug use: No  . Sexual activity: Yes    Partners: Male    Tobacco Counseling Counseling given: Not Answered   Activities of Daily Living In your present state of health, do you have any difficulty performing the following activities: 10/01/2015  Hearing? N  Vision? N  Difficulty concentrating or making decisions? N  Walking or climbing stairs? N  Dressing or bathing? N  Doing errands, shopping? N  Some recent data might be hidden    Immunizations and Health Maintenance Immunization History  Administered Date(s) Administered  . Td 06/18/2003, 07/12/2010   Health Maintenance Due  Topic Date Due  . Hepatitis C Screening  [birth year]  . HIV Screening  03/31/1965  . PNA vac Low Risk Adult (1 of 2 - PCV13) 04/01/2015  . PAP SMEAR  09/03/2015    Patient Care Team: Hali Marry, MD as PCP - General Dr. Cherylann Banas (Neurology)  Indicate any recent Medical Services you may have received from other than [facility] providers in the past year (date may be approximate).     Assessment:   This is a routine wellness examination for Nancy Parks.   Hearing/Vision screen No exam data present  Wears glasses.   Dietary issues and exercise activities discussed: Current Exercise Habits: Structured exercise class;Home  exercise routine, Type of exercise: yoga;walking;Other - see comments, Frequency (Times/Week): 6, Intensity: ModerateYes. She does yoga daily and water exercises twice per week. She plans on starting to walk again seen.  Goals    None     Depression Screen PHQ 2/9 Scores 10/01/2015 05/26/2013  PHQ - 2 Score 0 0    Fall Risk Fall Risk  10/01/2015 08/29/2015 02/28/2015 11/07/2014 06/08/2014  Falls in the past year? No No No No No    Cognitive Function: No flowsheet data found.  6 CIT normal - see scanned sheet.   Screening Tests Health Maintenance  Topic Date Due  . Hepatitis C Screening  1951/01/16  . HIV Screening  03/31/1965  . PNA vac Low Risk Adult (1 of 2 -  PCV13) 04/01/2015  . PAP SMEAR  09/03/2015  . INFLUENZA VACCINE  11/07/2015 (Originally 09/18/2015)  . ZOSTAVAX  06/04/2016 (Originally 03/31/2010)  . MAMMOGRAM  03/12/2017  . COLONOSCOPY  02/18/2019  . TETANUS/TDAP  07/11/2020  . DEXA SCAN  Completed      Plan:  Medicare Wellness, Welcome  During the course of the visit, Nancy Parks was educated and counseled about the following appropriate screening and preventive services:   Vaccines to include Pneumoccal  Electrocardiogram - Normal sinus rhythm, rate 79 bpm, normal axis. No acute ST-T wave changes. Please see scanned document.  Cardiovascular disease screening  Colorectal cancer screening - UTD  Bone density screening  Diabetes screening  Glaucoma screening - yearly eye exam   Mammography/PAP - UTD  Patient Instructions (the written plan) were given to the patient.    Tatyana Biber, MD   10/01/2015

## 2015-10-02 LAB — HIV ANTIBODY (ROUTINE TESTING W REFLEX): HIV 1&2 Ab, 4th Generation: NONREACTIVE

## 2015-10-02 LAB — HEPATITIS C ANTIBODY: HCV Ab: NEGATIVE

## 2015-10-03 LAB — CYTOLOGY - PAP

## 2015-10-04 NOTE — Progress Notes (Signed)
Call patient: Your Pap smear is normal. Repeat in 5 years.

## 2015-10-15 ENCOUNTER — Encounter: Payer: Self-pay | Admitting: Family Medicine

## 2015-10-18 DIAGNOSIS — G43009 Migraine without aura, not intractable, without status migrainosus: Secondary | ICD-10-CM | POA: Diagnosis not present

## 2015-10-31 DIAGNOSIS — R69 Illness, unspecified: Secondary | ICD-10-CM | POA: Diagnosis not present

## 2015-10-31 DIAGNOSIS — M25561 Pain in right knee: Secondary | ICD-10-CM | POA: Diagnosis not present

## 2015-11-30 DIAGNOSIS — L578 Other skin changes due to chronic exposure to nonionizing radiation: Secondary | ICD-10-CM | POA: Diagnosis not present

## 2015-11-30 DIAGNOSIS — L298 Other pruritus: Secondary | ICD-10-CM | POA: Diagnosis not present

## 2015-11-30 DIAGNOSIS — L820 Inflamed seborrheic keratosis: Secondary | ICD-10-CM | POA: Diagnosis not present

## 2015-11-30 DIAGNOSIS — Z08 Encounter for follow-up examination after completed treatment for malignant neoplasm: Secondary | ICD-10-CM | POA: Diagnosis not present

## 2015-11-30 DIAGNOSIS — Z85828 Personal history of other malignant neoplasm of skin: Secondary | ICD-10-CM | POA: Diagnosis not present

## 2015-12-17 DIAGNOSIS — R69 Illness, unspecified: Secondary | ICD-10-CM | POA: Diagnosis not present

## 2016-01-15 ENCOUNTER — Telehealth: Payer: Self-pay | Admitting: *Deleted

## 2016-01-15 NOTE — Telephone Encounter (Signed)
Called and informed pt of recommendations and she stated that this medication is going from a tier 1 to tier 2 and she feels that changing the strength is not going to help with the cost.   Will fwd to pcp .Nancy Parks [place]

## 2016-01-15 NOTE — Telephone Encounter (Signed)
Pt lvm stating that the cost of the amitriptyline is going up to $42 and wanted to know if this can be switched to something comparable?   Will fwd to pcp for advice.Nancy Parks [place]

## 2016-01-15 NOTE — Telephone Encounter (Signed)
We can call the pharmacy and see fi the 100 mg is cheaper. If so then we can have her split tab.    [name], MD

## 2016-01-16 MED ORDER — NORTRIPTYLINE HCL 25 MG PO CAPS: 25.0000 mg | ORAL_CAPSULE | Freq: Every day | ORAL | 2 refills | Status: DC

## 2016-01-16 NOTE — Telephone Encounter (Signed)
Okay, will try changing to nortriptyline but I have no idea whether or not it is a tier 1 or 2 to either. All and notes generic. That amitriptyline is generic as well. If the cost is still a little elevated on this one then she'll actually have to call her insurance and see what his preferred on her insurance plan.

## 2016-01-16 NOTE — Telephone Encounter (Signed)
lvm w/recommendations.Nancy Parks  

## 2016-01-30 DIAGNOSIS — Z6825 Body mass index (BMI) 25.0-25.9, adult: Secondary | ICD-10-CM | POA: Diagnosis not present

## 2016-01-30 DIAGNOSIS — G43009 Migraine without aura, not intractable, without status migrainosus: Secondary | ICD-10-CM | POA: Diagnosis not present

## 2016-01-30 DIAGNOSIS — Z0000 Encounter for general adult medical examination without abnormal findings: Secondary | ICD-10-CM | POA: Diagnosis not present

## 2016-02-26 DIAGNOSIS — R69 Illness, unspecified: Secondary | ICD-10-CM | POA: Diagnosis not present

## 2016-02-29 ENCOUNTER — Other Ambulatory Visit (HOSPITAL_BASED_OUTPATIENT_CLINIC_OR_DEPARTMENT_OTHER): Payer: Medicare HMO

## 2016-02-29 ENCOUNTER — Ambulatory Visit (HOSPITAL_BASED_OUTPATIENT_CLINIC_OR_DEPARTMENT_OTHER): Payer: Medicare HMO | Admitting: Hematology & Oncology

## 2016-02-29 VITALS — BP 130/71 | HR 99 | Temp 98.0°F | Resp 18 | Wt 180.0 lb

## 2016-02-29 DIAGNOSIS — D72818 Other decreased white blood cell count: Secondary | ICD-10-CM | POA: Diagnosis not present

## 2016-02-29 DIAGNOSIS — D72819 Decreased white blood cell count, unspecified: Secondary | ICD-10-CM

## 2016-02-29 DIAGNOSIS — D708 Other neutropenia: Secondary | ICD-10-CM

## 2016-02-29 DIAGNOSIS — C9000 Multiple myeloma not having achieved remission: Secondary | ICD-10-CM | POA: Diagnosis not present

## 2016-02-29 LAB — CBC WITH DIFFERENTIAL (CANCER CENTER ONLY)
BASO#: 0 10*3/uL (ref 0.0–0.2)
BASO%: 0.4 % (ref 0.0–2.0)
EOS ABS: 0 10*3/uL (ref 0.0–0.5)
EOS%: 0.4 % (ref 0.0–7.0)
HCT: 39.2 % (ref 34.8–46.6)
HEMOGLOBIN: 13.4 g/dL (ref 11.6–15.9)
LYMPH#: 1.4 10*3/uL (ref 0.9–3.3)
LYMPH%: 52.1 % — ABNORMAL HIGH (ref 14.0–48.0)
MCH: 33 pg (ref 26.0–34.0)
MCHC: 34.2 g/dL (ref 32.0–36.0)
MCV: 97 fL (ref 81–101)
MONO#: 0.1 10*3/uL (ref 0.1–0.9)
MONO%: 4.9 % (ref 0.0–13.0)
NEUT%: 42.2 % (ref 39.6–80.0)
NEUTROS ABS: 1.1 10*3/uL — AB (ref 1.5–6.5)
Platelets: 332 10*3/uL (ref 145–400)
RBC: 4.06 10*6/uL (ref 3.70–5.32)
RDW: 13.1 % (ref 11.1–15.7)
WBC: 2.7 10*3/uL — AB (ref 3.9–10.0)

## 2016-02-29 LAB — COMPREHENSIVE METABOLIC PANEL
ALBUMIN: 4.1 g/dL (ref 3.5–5.0)
ALT: 15 U/L (ref 0–55)
AST: 17 U/L (ref 5–34)
Alkaline Phosphatase: 147 U/L (ref 40–150)
Anion Gap: 7 mEq/L (ref 3–11)
BILIRUBIN TOTAL: 0.38 mg/dL (ref 0.20–1.20)
BUN: 10.7 mg/dL (ref 7.0–26.0)
CO2: 28 mEq/L (ref 22–29)
CREATININE: 0.9 mg/dL (ref 0.6–1.1)
Calcium: 9.5 mg/dL (ref 8.4–10.4)
Chloride: 105 mEq/L (ref 98–109)
EGFR: 68 mL/min/{1.73_m2} — AB (ref >90)
GLUCOSE: 113 mg/dL (ref 70–140)
Potassium: 3.9 mEq/L (ref 3.5–5.1)
SODIUM: 141 meq/L (ref 136–145)
TOTAL PROTEIN: 7.4 g/dL (ref 6.4–8.3)

## 2016-02-29 NOTE — Progress Notes (Signed)
Hematology and Oncology Follow Up Visit  Nancy Parks [id number] 1951/02/09 66 y.o. 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 We last saw her back in the summer. She's been doing well.  She went to [place] in the fall. She really had a good time in [place]. She was there for 7 days. She enjoyed it.  She found had a mammogram last year. It was done in January. Everything looked okay.  She has migraine issues. She sees a neurologist for this. She is on some vitamin supplements.  Her last myeloma studies that we did on her back in July showed a monoclonal spike of 0.9 g/dL. Her IgG level was 1170 mg/dL and her lambda light chain is 1.15 mg/dL.  She's had no bone pain. Patient had no issues with infections.  His been no change in bowel or bladder habits.  She's had no rashes. There's been no leg swelling.  Her performance status is ECOG 0.     Medications:  Current Outpatient Prescriptions:  .  ASHWAGANDHA PO, Take 380 mg by mouth daily., Disp: , Rfl:  .  cholecalciferol (VITAMIN D) 1000 units tablet, Take 2,000 Units by mouth daily., Disp: , Rfl:  .  THYROID PO, Take by mouth. Iodine 30mg  L-Tyrosine 400mg , Disp: , Rfl:  .  CALCIUM & MAGNESIUM CARBONATES PO, Take 4 capsules by mouth., Disp: , Rfl:  .  Misc Natural Products (TURMERIC CURCUMIN) CAPS, Take 150 capsules by mouth., Disp: , Rfl:  .  nortriptyline (PAMELOR) 25 MG capsule, Take 1 capsule (25 mg total) by mouth at bedtime., Disp: 30 capsule, Rfl: 2 .  Specialty Vitamins Products (MAGNESIUM, AMINO ACID CHELATE,) 133 MG tablet, Take 1 tablet by mouth 2 (two) times daily. 1000 mg bid, Disp: , Rfl:  .  SUMAtriptan (IMITREX) 100 MG tablet, Take 100 mg by mouth as needed for migraine., Disp: , Rfl:   Allergies:  Allergies  Allergen Reactions  . Latex Rash      REACTION: Rash REACTION: Rash REACTION: Rash  . Tape Rash    Past Medical History, Surgical history, Social history, and Family History were reviewed and updated.  Review of Systems: As above  Physical Exam:  weight is 180 lb (81.6 kg). Her oral temperature is 98 F (36.7 C). Her blood pressure is 130/71 and her pulse is 99. Her respiration is 18 and oxygen saturation is 99%.   Lungs are clear. Cardiac exam regular rate and rhythm. She has no murmurs rubs or bruits. Abdomen is soft. She has good bowel sounds. There is no fluid wave. There is no palpable liver or spleen tip. Back exam no tenderness over the spine ribs or hips. Extremities shows no clubbing cyanosis or edema. Has good range of motion of her joints. Has good muscle strength. Skin exam shows no rashes ecchymosis or petechia. Neurological exam no focal neurological deficits. Head and neck exam shows no adenopathy in the neck. Thyroid is nonpalpable. No ocular or oral lesions are noted  Lab Results  Component Value Date   WBC 2.7 (L) 02/29/2016   HGB 13.4 02/29/2016   HCT 39.2 02/29/2016   MCV 97 02/29/2016   PLT 332 02/29/2016     Chemistry      Component Value Date/Time   NA 141 10/01/2015 1037   NA  140 08/29/2015 1407   K 4.2 10/01/2015 1037   K 3.9 08/29/2015 1407   CL 106 10/01/2015 1037   CL 103 02/28/2015 1523   CL 104 01/19/2014 0908   CO2 26 10/01/2015 1037   CO2 24 08/29/2015 1407   BUN 8 10/01/2015 1037   BUN 9.3 08/29/2015 1407   CREATININE 0.84 10/01/2015 1037   CREATININE 0.8 08/29/2015 1407      Component Value Date/Time   CALCIUM 9.2 10/01/2015 1037   CALCIUM 8.9 08/29/2015 1407   ALKPHOS 105 10/01/2015 1037   ALKPHOS 114 08/29/2015 1407   AST 16 10/01/2015 1037   AST 17 08/29/2015 1407   ALT 15 10/01/2015 1037   ALT 14 08/29/2015 1407   BILITOT 0.5 10/01/2015 1037   BILITOT 0.47 08/29/2015 1407       Impression and Plan: Nancy Parks is 66 year old white female. She has IgG lambda  smoldering myeloma. She is looking quite good right now.   Again, her myeloma studies have trended upward slowly. She is asymptomatic.. We will just follow this along.  I looked at her blood smear. Her white cell count is down a little bit. I do not see any immature myeloid cells. She has decent neutrophils. She has mature appearing lymphocytes.  I think that we can get her back in another 8 months. She's done incredibly well. I don't see any need for any additional studies on her.   [name], MD 1/12/20182:48 PM

## 2016-03-03 LAB — KAPPA/LAMBDA LIGHT CHAINS
IG KAPPA FREE LIGHT CHAIN: 8.9 mg/L (ref 3.3–19.4)
Ig Lambda Free Light Chain: 12.3 mg/L (ref 5.7–26.3)
Kappa/Lambda FluidC Ratio: 0.72 (ref 0.26–1.65)

## 2016-03-03 LAB — BETA 2 MICROGLOBULIN, SERUM: BETA 2: 1.8 mg/L (ref 0.6–2.4)

## 2016-03-05 LAB — MULTIPLE MYELOMA PANEL, SERUM
ALBUMIN SERPL ELPH-MCNC: 3.6 g/dL (ref 2.9–4.4)
ALBUMIN/GLOB SERPL: 1.1 (ref 0.7–1.7)
Alpha 1: 0.2 g/dL (ref 0.0–0.4)
Alpha2 Glob SerPl Elph-Mcnc: 0.7 g/dL (ref 0.4–1.0)
B-GLOBULIN SERPL ELPH-MCNC: 1 g/dL (ref 0.7–1.3)
GAMMA GLOB SERPL ELPH-MCNC: 1.4 g/dL (ref 0.4–1.8)
GLOBULIN, TOTAL: 3.4 g/dL (ref 2.2–3.9)
IgA, Qn, Serum: 59 mg/dL — ABNORMAL LOW (ref 87–352)
IgG, Qn, Serum: 1315 mg/dL (ref 700–1600)
IgM, Qn, Serum: 88 mg/dL (ref 26–217)
M Protein SerPl Elph-Mcnc: 1.1 g/dL — ABNORMAL HIGH
Total Protein: 7 g/dL (ref 6.0–8.5)

## 2016-04-14 ENCOUNTER — Other Ambulatory Visit: Payer: Self-pay | Admitting: *Deleted

## 2016-04-14 MED ORDER — NORTRIPTYLINE HCL 25 MG PO CAPS: 25.0000 mg | ORAL_CAPSULE | Freq: Every day | ORAL | 2 refills | Status: DC

## 2016-05-06 ENCOUNTER — Ambulatory Visit (INDEPENDENT_AMBULATORY_CARE_PROVIDER_SITE_OTHER): Payer: Medicare HMO | Admitting: Family Medicine

## 2016-05-06 ENCOUNTER — Encounter: Payer: Self-pay | Admitting: Family Medicine

## 2016-05-06 VITALS — BP 130/72 | HR 98 | Ht 70.0 in | Wt 179.0 lb

## 2016-05-06 DIAGNOSIS — F325 Major depressive disorder, single episode, in full remission: Secondary | ICD-10-CM | POA: Diagnosis not present

## 2016-05-06 DIAGNOSIS — Z13220 Encounter for screening for lipoid disorders: Secondary | ICD-10-CM | POA: Diagnosis not present

## 2016-05-06 DIAGNOSIS — G43109 Migraine with aura, not intractable, without status migrainosus: Secondary | ICD-10-CM | POA: Diagnosis not present

## 2016-05-06 DIAGNOSIS — M810 Age-related osteoporosis without current pathological fracture: Secondary | ICD-10-CM

## 2016-05-06 DIAGNOSIS — Z1231 Encounter for screening mammogram for malignant neoplasm of breast: Secondary | ICD-10-CM | POA: Diagnosis not present

## 2016-05-06 DIAGNOSIS — R69 Illness, unspecified: Secondary | ICD-10-CM | POA: Diagnosis not present

## 2016-05-06 MED ORDER — NORTRIPTYLINE HCL 25 MG PO CAPS: 25.0000 mg | ORAL_CAPSULE | Freq: Every day | ORAL | 3 refills | Status: DC

## 2016-05-06 NOTE — Progress Notes (Signed)
   Subjective:    Patient ID: Nancy Parks, female    DOB: March 03, 1950, 66 y.o.   MRN: [id number]  HPI F/U depression.  Were all she's doing really well. Nice feeling down depressed or hopeless.  Migraine HA - She is doing really well on the notryptiline.  NO S.E on the new medication.  We had to switch because of tear change on her insurance.  She is ok with scheduling her mammogram downstairs.    Review of Systems     Objective:   Physical Exam  Constitutional: She is oriented to person, place, and time. She appears well-developed and well-nourished.  HENT:  Head: Normocephalic and atraumatic.  Cardiovascular: Normal rate, regular rhythm and normal heart sounds.   Pulmonary/Chest: Effort normal and breath sounds normal.  Neurological: She is alert and oriented to person, place, and time.  Skin: Skin is warm and dry.  Psychiatric: She has a normal mood and affect. Her behavior is normal.          Assessment & Plan:   Depression - in remission.  Doing well.  The medication.  Migarine HA - well controlled. She's not had a use her rescue medication at all.  Mammogram due - order placed.    Due for DEXA. Will order. On 5 year schedule   Declined lfu vaccine and Pneumonia vaccine.

## 2016-05-23 ENCOUNTER — Ambulatory Visit (INDEPENDENT_AMBULATORY_CARE_PROVIDER_SITE_OTHER): Payer: Medicare HMO

## 2016-05-23 DIAGNOSIS — Z1231 Encounter for screening mammogram for malignant neoplasm of breast: Secondary | ICD-10-CM | POA: Diagnosis not present

## 2016-05-23 DIAGNOSIS — M85851 Other specified disorders of bone density and structure, right thigh: Secondary | ICD-10-CM

## 2016-05-23 DIAGNOSIS — M810 Age-related osteoporosis without current pathological fracture: Secondary | ICD-10-CM | POA: Diagnosis not present

## 2016-05-23 DIAGNOSIS — Z79899 Other long term (current) drug therapy: Secondary | ICD-10-CM | POA: Diagnosis not present

## 2016-05-23 DIAGNOSIS — M818 Other osteoporosis without current pathological fracture: Secondary | ICD-10-CM | POA: Diagnosis not present

## 2016-05-23 DIAGNOSIS — Z13220 Encounter for screening for lipoid disorders: Secondary | ICD-10-CM | POA: Diagnosis not present

## 2016-05-24 LAB — LIPID PANEL W/REFLEX DIRECT LDL
CHOLESTEROL: 187 mg/dL (ref <200)
HDL: 47 mg/dL — ABNORMAL LOW (ref >50)
LDL-Cholesterol: 119 mg/dL — ABNORMAL HIGH
NON-HDL CHOLESTEROL (CALC): 140 mg/dL — AB (ref <130)
TRIGLYCERIDES: 105 mg/dL (ref <150)
Total CHOL/HDL Ratio: 4 Ratio (ref <5.0)

## 2016-07-01 DIAGNOSIS — R69 Illness, unspecified: Secondary | ICD-10-CM | POA: Diagnosis not present

## 2016-08-07 DIAGNOSIS — H26492 Other secondary cataract, left eye: Secondary | ICD-10-CM | POA: Diagnosis not present

## 2016-08-07 DIAGNOSIS — Z961 Presence of intraocular lens: Secondary | ICD-10-CM | POA: Diagnosis not present

## 2016-10-31 ENCOUNTER — Other Ambulatory Visit: Payer: Self-pay

## 2016-10-31 ENCOUNTER — Ambulatory Visit: Payer: Self-pay | Admitting: Hematology & Oncology

## 2016-11-06 ENCOUNTER — Other Ambulatory Visit (HOSPITAL_BASED_OUTPATIENT_CLINIC_OR_DEPARTMENT_OTHER): Payer: Medicare HMO

## 2016-11-06 ENCOUNTER — Ambulatory Visit (HOSPITAL_BASED_OUTPATIENT_CLINIC_OR_DEPARTMENT_OTHER): Payer: Medicare HMO | Admitting: Hematology & Oncology

## 2016-11-06 VITALS — BP 119/65 | HR 88 | Temp 98.2°F | Resp 18 | Wt 173.0 lb

## 2016-11-06 DIAGNOSIS — C9000 Multiple myeloma not having achieved remission: Secondary | ICD-10-CM | POA: Diagnosis not present

## 2016-11-06 DIAGNOSIS — D708 Other neutropenia: Secondary | ICD-10-CM | POA: Diagnosis not present

## 2016-11-06 DIAGNOSIS — D72818 Other decreased white blood cell count: Secondary | ICD-10-CM | POA: Diagnosis not present

## 2016-11-06 LAB — CBC WITH DIFFERENTIAL (CANCER CENTER ONLY)
BASO#: 0 10*3/uL (ref 0.0–0.2)
BASO%: 1 % (ref 0.0–2.0)
EOS%: 0.5 % (ref 0.0–7.0)
Eosinophils Absolute: 0 10*3/uL (ref 0.0–0.5)
HEMATOCRIT: 36.7 % (ref 34.8–46.6)
HGB: 12.5 g/dL (ref 11.6–15.9)
LYMPH#: 1.2 10*3/uL (ref 0.9–3.3)
LYMPH%: 57.9 % — ABNORMAL HIGH (ref 14.0–48.0)
MCH: 33.3 pg (ref 26.0–34.0)
MCHC: 34.1 g/dL (ref 32.0–36.0)
MCV: 98 fL (ref 81–101)
MONO#: 0.1 10*3/uL (ref 0.1–0.9)
MONO%: 5.3 % (ref 0.0–13.0)
NEUT%: 35.3 % — AB (ref 39.6–80.0)
NEUTROS ABS: 0.7 10*3/uL — AB (ref 1.5–6.5)
Platelets: 295 10*3/uL (ref 145–400)
RBC: 3.75 10*6/uL (ref 3.70–5.32)
RDW: 13.5 % (ref 11.1–15.7)
WBC: 2.1 10*3/uL — ABNORMAL LOW (ref 3.9–10.0)

## 2016-11-06 LAB — CHCC SATELLITE - SMEAR

## 2016-11-06 NOTE — Progress Notes (Signed)
Hematology and Oncology Follow Up Visit  Nancy Parks [id number] 1950-12-09 66 y.o. 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  Parks is back for followup. We actually saw Nancy Parks. We saw Nancy back in January. At that time, she was doing quite well. She's had no problems since we last saw Nancy. She's been to the beach 3 times.  As far as Nancy myeloma studies are concerned, Nancy last M spike in January was 1.1 g/dL. Nancy IgG level was 1315 milligrams per deciliter. Nancy lambda light chain was 1.2 mg/dL.  She had a mammogram in April. This looked fine.  She's had no problems with infections. She's had no rashes. She's had no change in bowel or bladder habits.  His been no bleeding. She's had no weight loss or weight gain.  Overall, Nancy performance status is ECOG 0.  Medications:  Current Outpatient Prescriptions:  .  AMBULATORY NON FORMULARY MEDICATION, Take 750 mg by mouth 2 (two) times daily., Disp: , Rfl:  .  ASHWAGANDHA PO, Take 380 mg by mouth daily., Disp: , Rfl:  .  CALCIUM & MAGNESIUM CARBONATES PO, Take 4 capsules by mouth., Disp: , Rfl:  .  cholecalciferol (VITAMIN D) 1000 units tablet, Take 2,000 Units by mouth daily., Disp: , Rfl:  .  Misc Natural Products (TURMERIC CURCUMIN) CAPS, Take 150 capsules by mouth., Disp: , Rfl:  .  nortriptyline (PAMELOR) 25 MG capsule, Take 1 capsule (25 mg total) by mouth at bedtime., Disp: 90 capsule, Rfl: 3 .  SUMAtriptan (IMITREX) 100 MG tablet, Take 100 mg by mouth as needed for migraine., Disp: , Rfl:  .  THYROID PO, Take by mouth. Iodine 30mg  L-Tyrosine 400mg , Disp: , Rfl:   Allergies:  Allergies  Allergen Reactions  . Latex Rash    REACTION: Rash   . Tape Rash  . Fosamax [Alendronate Sodium] Other (See Comments)    GI upset    Past Medical History, Surgical history, Social history,  and Family History were reviewed and updated.  Review of Systems: As stated in the interim history  Physical Exam:  weight is 173 lb (78.5 kg). Nancy oral temperature is 98.2 F (36.8 C). Nancy blood pressure is 119/65 and Nancy pulse is 88. Nancy respiration is 18 and oxygen saturation is 100%.   Well-developed and well-nourished white female. Nancy skin is quite fair. She does have some actinic keratoses. Nancy head and neck exam shows no adenopathy in the neck. She has no scleral icterus. Nancy conjunctiva are pink. She has no oral lesions. Lungs are clear. Cardiac exam regular rate and rhythm with no murmurs, rubs or bruits. Abdomen is soft. She has good bowel sounds. There is no fluid wave. There is no palpable liver or spleen tip. Back exam shows no tenderness over the spine, ribs or hips. Extremities shows no clubbing, cyanosis or edema. Shows good range of motion of Nancy joints. She has good strength. Neurological exam is nonfocal.  Lab Results  Component Value Date   WBC 2.1 (L) 11/06/2016   HGB 12.5 11/06/2016   HCT 36.7 11/06/2016   MCV 98 11/06/2016   PLT 295 11/06/2016     Chemistry      Component Value Date/Time   NA 141 02/29/2016 1318   K 3.9 02/29/2016 1318   CL 106 10/01/2015 1037  CL 103 02/28/2015 1523   CL 104 01/19/2014 0908   CO2 28 02/29/2016 1318   BUN 10.7 02/29/2016 1318   CREATININE 0.9 02/29/2016 1318      Component Value Date/Time   CALCIUM 9.5 02/29/2016 1318   ALKPHOS 147 02/29/2016 1318   AST 17 02/29/2016 1318   ALT 15 02/29/2016 1318   BILITOT 0.38 02/29/2016 1318       Impression and Plan: Nancy Parks is 66 year old white female. She has a IgG lambda smoldering myeloma.  We will have to see what Nancy M spike is. So far, it has been holding relatively stable.  I'm still not sure if the leukopenia is somehow related to the smoldering myeloma or this is more of an autoimmune process.  Thankfully, she just has never had a problem with the  leukopenia.  We will plan to get Nancy back to see [name] in another 8 months. We will get Nancy back sooner if we need to depending on Nancy M spike.  As always, it is a lot of fun talking with Nancy Parks. She is so interesting and just has a lot of experience is that she is most eloquent in talking about.  [name], MD 9/20/201810:07 AM

## 2016-11-07 LAB — IGG, IGA, IGM
IGM (IMMUNOGLOBIN M), SRM: 82 mg/dL (ref 26–217)
IgA, Qn, Serum: 52 mg/dL — ABNORMAL LOW (ref 87–352)

## 2016-11-07 LAB — KAPPA/LAMBDA LIGHT CHAINS
Ig Kappa Free Light Chain: 8.7 mg/L (ref 3.3–19.4)
Ig Lambda Free Light Chain: 14.4 mg/L (ref 5.7–26.3)
Kappa/Lambda FluidC Ratio: 0.6 (ref 0.26–1.65)

## 2016-11-11 LAB — PROTEIN ELECTROPHORESIS, SERUM, WITH REFLEX
A/G Ratio: 1.2 (ref 0.7–1.7)
ALBUMIN: 3.8 g/dL (ref 2.9–4.4)
ALPHA 1: 0.2 g/dL (ref 0.0–0.4)
ALPHA 2: 0.6 g/dL (ref 0.4–1.0)
Beta: 0.9 g/dL (ref 0.7–1.3)
Gamma Globulin: 1.4 g/dL (ref 0.4–1.8)
Globulin, Total: 3.1 g/dL (ref 2.2–3.9)
INTERPRETATION(SEE BELOW): 0
M-Spike, %: 1.1 g/dL — ABNORMAL HIGH
Total Protein: 6.9 g/dL (ref 6.0–8.5)

## 2016-11-12 ENCOUNTER — Telehealth: Payer: Self-pay | Admitting: *Deleted

## 2016-11-12 NOTE — Telephone Encounter (Addendum)
Patient is aware of results  ----- Message from [name], MD sent at 11/12/2016  6:21 AM EDT ----- Call - the myeloma level is still holding low and steady!!!  pete

## 2016-12-02 DIAGNOSIS — Z08 Encounter for follow-up examination after completed treatment for malignant neoplasm: Secondary | ICD-10-CM | POA: Diagnosis not present

## 2016-12-02 DIAGNOSIS — L570 Actinic keratosis: Secondary | ICD-10-CM | POA: Diagnosis not present

## 2016-12-02 DIAGNOSIS — L821 Other seborrheic keratosis: Secondary | ICD-10-CM | POA: Diagnosis not present

## 2016-12-02 DIAGNOSIS — Z85828 Personal history of other malignant neoplasm of skin: Secondary | ICD-10-CM | POA: Diagnosis not present

## 2016-12-31 DIAGNOSIS — R69 Illness, unspecified: Secondary | ICD-10-CM | POA: Diagnosis not present

## 2017-01-02 ENCOUNTER — Ambulatory Visit: Payer: Medicare HMO | Admitting: Family Medicine

## 2017-01-02 ENCOUNTER — Ambulatory Visit (INDEPENDENT_AMBULATORY_CARE_PROVIDER_SITE_OTHER): Payer: Medicare HMO

## 2017-01-02 ENCOUNTER — Encounter: Payer: Self-pay | Admitting: Family Medicine

## 2017-01-02 VITALS — BP 133/75 | HR 65 | Ht 70.0 in | Wt 175.0 lb

## 2017-01-02 DIAGNOSIS — M79645 Pain in left finger(s): Secondary | ICD-10-CM | POA: Diagnosis not present

## 2017-01-02 DIAGNOSIS — S6992XA Unspecified injury of left wrist, hand and finger(s), initial encounter: Secondary | ICD-10-CM | POA: Diagnosis not present

## 2017-01-02 NOTE — Progress Notes (Signed)
   Subjective:    Patient ID: Barbera Perritt, female    DOB: 05/31/1950, 66 y.o.   MRN: [id number]  HPI 66 year old female with a history of osteoporosis and multiple myeloma comes in today after slipping and falling at a restaurant yesterday.  She actually fell towards her left side.  She hit her shoulder and hip.  When she woke up this morning she realized that her left hand particularly the thumb was painful.  She says her shoulder and hip actually feel better and she is not worried about those in particular she is more worried about her thumb.  She took some ibuprofen and has been icing it which does help.   Review of Systems     Objective:   Physical Exam  Constitutional: She appears well-developed and well-nourished.  HENT:  Head: Normocephalic and atraumatic.  Musculoskeletal:  Right left thumb with swelling at the PIP.  There is some swelling and bruising over the joint line she is also just tender right over that area as well.  She has normal range of motion in that thumb.  She has decreased strength secondary to pain and discomfort though.  Other fingers are moving normally with good range of motion.          Assessment & Plan:  Thumb pain-we will get x-rays to rule out fracture.  X-ray is negative.  Offered supportive patient brace but patient declined.  Just continue to rest ice and take ibuprofen as needed.  Follow-up if not improving.

## 2017-01-13 DIAGNOSIS — Z961 Presence of intraocular lens: Secondary | ICD-10-CM | POA: Diagnosis not present

## 2017-01-13 DIAGNOSIS — Z9842 Cataract extraction status, left eye: Secondary | ICD-10-CM | POA: Diagnosis not present

## 2017-01-13 DIAGNOSIS — Z9841 Cataract extraction status, right eye: Secondary | ICD-10-CM | POA: Diagnosis not present

## 2017-03-12 DIAGNOSIS — H524 Presbyopia: Secondary | ICD-10-CM | POA: Diagnosis not present

## 2017-04-27 ENCOUNTER — Ambulatory Visit (INDEPENDENT_AMBULATORY_CARE_PROVIDER_SITE_OTHER): Payer: Medicare HMO | Admitting: Family Medicine

## 2017-04-27 ENCOUNTER — Encounter: Payer: Self-pay | Admitting: Family Medicine

## 2017-04-27 VITALS — BP 121/68 | HR 89 | Ht 70.0 in | Wt 168.0 lb

## 2017-04-27 DIAGNOSIS — R42 Dizziness and giddiness: Secondary | ICD-10-CM | POA: Diagnosis not present

## 2017-04-27 DIAGNOSIS — N63 Unspecified lump in unspecified breast: Secondary | ICD-10-CM

## 2017-04-27 DIAGNOSIS — R740 Nonspecific elevation of levels of transaminase and lactic acid dehydrogenase [LDH]: Secondary | ICD-10-CM | POA: Diagnosis not present

## 2017-04-27 DIAGNOSIS — E559 Vitamin D deficiency, unspecified: Secondary | ICD-10-CM | POA: Diagnosis not present

## 2017-04-27 DIAGNOSIS — Z1321 Encounter for screening for nutritional disorder: Secondary | ICD-10-CM | POA: Diagnosis not present

## 2017-04-27 DIAGNOSIS — R7989 Other specified abnormal findings of blood chemistry: Secondary | ICD-10-CM | POA: Diagnosis not present

## 2017-04-27 DIAGNOSIS — R413 Other amnesia: Secondary | ICD-10-CM | POA: Diagnosis not present

## 2017-04-27 DIAGNOSIS — G629 Polyneuropathy, unspecified: Secondary | ICD-10-CM

## 2017-04-27 DIAGNOSIS — R5383 Other fatigue: Secondary | ICD-10-CM | POA: Diagnosis not present

## 2017-04-27 DIAGNOSIS — R748 Abnormal levels of other serum enzymes: Secondary | ICD-10-CM | POA: Diagnosis not present

## 2017-04-27 DIAGNOSIS — D72819 Decreased white blood cell count, unspecified: Secondary | ICD-10-CM | POA: Diagnosis not present

## 2017-04-27 NOTE — Progress Notes (Signed)
Subjective:    Patient ID: Nancy Parks, female    DOB: 09/07/1950, 67 y.o.   MRN: [id number]  HPI For the last 2 weeks she feels lightheaded when she changes positions.   She had one day where after her workout she actually went to the pharmacy because she was feeling lightheaded and dizzy and checked her blood pressure.  It was 100/75.  She says she drinks plenty of water every day.  She was not having any chest pain or lightheadedness with it.    Last November started working out 2 days with a trainer bc she was feeling weak and tired. She feels like her diet is good and gets enough protein.  She still just feels extremely fatigued.  She also feels unmotivated to do things partly because of the fatigue.  She will start to do chores around the house and then just feels like she has to sit down and rest.  She also reports she is been having problems with her memory particularly processing issues.  Admits she has not been sleeping well.  Often times she will fall asleep and then wake up and cannot go back to sleep.  She still try to work 10-12 hours/week.  She feels that the memory concerns have been present for about a year and a half.  Says feels like her breast are swelling on and off. Says having a hard time remembering words and writing what she is thinking of.   She feels like there is a band around her foot proximal to her toes.  Says it feels like there are "pads" on her feet.  She actually has been getting acupuncture for this and it has been helping some.  Noticed that sometimes her feet looked almost purple.   Review of Systems  BP 121/68   Pulse 89   Ht 5' 10"  (1.778 m)   Wt 168 lb (76.2 kg)   SpO2 98%   BMI 24.11 kg/m     Allergies  Allergen Reactions  . Latex Rash    REACTION: Rash   . Tape Rash  . Fosamax [Alendronate Sodium] Other (See Comments)    GI upset    Past Medical History:  Diagnosis Date  . Depression   . Headache(784.0)    Migraines  . Leukopenia    . Leukopenia 07/13/2012  . Multiple myeloma, without mention of having achieved remission 07/13/2012    Past Surgical History:  Procedure Laterality Date  . BASAL CELL CARCINOMA EXCISION  2013   removed from nose  . CATARACT EXTRACTION  09/2007  . LAPAROSCOPIC ENDOMETRIOSIS FULGURATION  1979    Social History   Socioeconomic History  . Marital status: Divorced    Spouse name: Not on file  . Number of children: Not on file  . Years of education: Not on file  . Highest education level: Not on file  Social Needs  . Financial resource strain: Not on file  . Food insecurity - worry: Not on file  . Food insecurity - inability: Not on file  . Transportation needs - medical: Not on file  . Transportation needs - non-medical: Not on file  Occupational History  . Occupation: Aeronautical engineer: [employer]  . Occupation: Economist  Tobacco Use  . Smoking status: Never Smoker  . Smokeless tobacco: Never Used  . Tobacco comment: never used tobacco  Substance and Sexual Activity  . Alcohol use: No    Alcohol/week: 0.0 oz  . Drug  use: No  . Sexual activity: Yes    Partners: Male  Other Topics Concern  . Not on file  Social History Narrative   No exercise.  Doing 10 min of yoga.     Family History  Problem Relation Age of Onset  . Breast cancer Unknown        grandmother   . Colon cancer Father        colon adenoma  . Hyperlipidemia Sister   . Hypertension Sister   . Cancer Sister        throat, smoker  . Melanoma Sister     Outpatient Encounter Medications as of 04/27/2017  Medication Sig  . AMBULATORY NON FORMULARY MEDICATION Take 750 mg by mouth 2 (two) times daily. Cura med-Curcumin  . AMBULATORY NON FORMULARY MEDICATION Medication Name: digestive enzymes  . AMBULATORY NON FORMULARY MEDICATION every evening. Medication Name: spleen activator   . AMBULATORY NON FORMULARY MEDICATION Medication Name: 5 herbal energy- after meals  . AMBULATORY NON FORMULARY  MEDICATION Medication Name: probiotics flora restore  . ASHWAGANDHA PO Take 380 mg by mouth daily.  Marland Kitchen CALCIUM & MAGNESIUM CARBONATES PO Take 4 capsules by mouth.  . cholecalciferol (VITAMIN D) 1000 units tablet Take 2,000 Units by mouth daily.  Marland Kitchen MILK THISTLE PO Take by mouth.  . nortriptyline (PAMELOR) 25 MG capsule Take 1 capsule (25 mg total) by mouth at bedtime.  . SUMAtriptan (IMITREX) 100 MG tablet Take 100 mg by mouth as needed for migraine.  . THYROID PO Take by mouth. Iodine 43m L-Tyrosine 4058m . TURMERIC CURCUMIN PO Take by mouth.  . [DISCONTINUED] Misc Natural Products (TURMERIC CURCUMIN) CAPS Take 150 capsules by mouth.   No facility-administered encounter medications on file as of 04/27/2017.          Objective:   Physical Exam  Constitutional: She is oriented to person, place, and time. She appears well-developed and well-nourished.  HENT:  Head: Normocephalic and atraumatic.  Right Ear: External ear normal.  Left Ear: External ear normal.  Nose: Nose normal.  Mouth/Throat: Oropharynx is clear and moist.  TMs and canals are clear.   Eyes: Conjunctivae and EOM are normal. Pupils are equal, round, and reactive to light.  Neck: Neck supple. No thyromegaly present.  Cardiovascular: Normal rate, regular rhythm and normal heart sounds.  Pulmonary/Chest: Effort normal and breath sounds normal. She has no wheezes.  Abdominal: Soft. Bowel sounds are normal. She exhibits no distension and no mass. There is no tenderness. There is no rebound and no guarding.  Musculoskeletal: She exhibits no edema.  Dorsal pedal pulses and posterior tibial pulse 2+ bilaterally.  No discoloration of the feet.  Lymphadenopathy:    She has no cervical adenopathy.  Neurological: She is alert and oriented to person, place, and time.  Skin: Skin is warm and dry.  Psychiatric: She has a normal mood and affect. Her behavior is normal.          Assessment & Plan:  Fatigue-unclear etiology.   Will check for thyroid disease anemia etc.  Dizziness with position change-we will check orthostatics today. BP did drop with standing and really didn't rebound after 3 minutes.   Neuropathy-getting acupuncture which does seem to be helping.  Memory impairment-we will refer to neuropsychiatry for further workup and evaluation.  Consider MRI.  Breast swelling -clear etiology.  Will check hormone levels.

## 2017-04-28 ENCOUNTER — Encounter: Payer: Self-pay | Admitting: Family Medicine

## 2017-04-28 LAB — CORTISOL: Cortisol, Plasma: 5.6 ug/dL

## 2017-04-30 LAB — FOLLICLE STIMULATING HORMONE: FSH: 125.1 m[IU]/mL — AB

## 2017-04-30 LAB — CBC WITH DIFFERENTIAL/PLATELET
BASOS PCT: 0 %
Basophils Absolute: 0 cells/uL (ref 0–200)
EOS PCT: 0 %
Eosinophils Absolute: 0 cells/uL — ABNORMAL LOW (ref 15–500)
HCT: 36.4 % (ref 35.0–45.0)
Hemoglobin: 12.4 g/dL (ref 11.7–15.5)
Lymphs Abs: 1242 cells/uL (ref 850–3900)
MCH: 32.7 pg (ref 27.0–33.0)
MCHC: 34.1 g/dL (ref 32.0–36.0)
MCV: 96 fL (ref 80.0–100.0)
MONOS PCT: 5.1 %
MPV: 11 fL (ref 7.5–12.5)
NEUTROS ABS: 656 {cells}/uL — AB (ref 1500–7800)
Neutrophils Relative %: 32.8 %
PLATELETS: 268 10*3/uL (ref 140–400)
RBC: 3.79 10*6/uL — AB (ref 3.80–5.10)
RDW: 13.6 % (ref 11.0–15.0)
TOTAL LYMPHOCYTE: 62.1 %
WBC mixed population: 102 cells/uL — ABNORMAL LOW (ref 200–950)
WBC: 2 10*3/uL — AB (ref 3.8–10.8)

## 2017-04-30 LAB — LUTEINIZING HORMONE: LH: 45.2 m[IU]/mL

## 2017-04-30 LAB — MAGNESIUM: Magnesium: 2.3 mg/dL (ref 1.5–2.5)

## 2017-04-30 LAB — VITAMIN D 25 HYDROXY (VIT D DEFICIENCY, FRACTURES): VIT D 25 HYDROXY: 28 ng/mL — AB (ref 30–100)

## 2017-04-30 LAB — IRON,TIBC AND FERRITIN PANEL
%SAT: 24 % (calc) (ref 11–50)
Ferritin: 39 ng/mL (ref 20–288)
Iron: 65 ug/dL (ref 45–160)
TIBC: 275 ug/dL (ref 250–450)

## 2017-04-30 LAB — COMPLETE METABOLIC PANEL WITH GFR
AG Ratio: 1.8 (calc) (ref 1.0–2.5)
ALT: 21 U/L (ref 6–29)
AST: 36 U/L — AB (ref 10–35)
Albumin: 4.3 g/dL (ref 3.6–5.1)
Alkaline phosphatase (APISO): 98 U/L (ref 33–130)
BUN: 11 mg/dL (ref 7–25)
CALCIUM: 9.6 mg/dL (ref 8.6–10.4)
CO2: 30 mmol/L (ref 20–32)
CREATININE: 0.97 mg/dL (ref 0.50–0.99)
Chloride: 105 mmol/L (ref 98–110)
GFR, EST NON AFRICAN AMERICAN: 60 mL/min/{1.73_m2} (ref >=60)
GFR, Est African American: 70 mL/min/{1.73_m2} (ref >=60)
GLUCOSE: 94 mg/dL (ref 65–99)
Globulin: 2.4 g/dL (calc) (ref 1.9–3.7)
Potassium: 4.2 mmol/L (ref 3.5–5.3)
Sodium: 140 mmol/L (ref 135–146)
Total Bilirubin: 0.4 mg/dL (ref 0.2–1.2)
Total Protein: 6.7 g/dL (ref 6.1–8.1)

## 2017-04-30 LAB — FOLATE: Folate: 5.7 ng/mL

## 2017-04-30 LAB — TEST AUTHORIZATION

## 2017-04-30 LAB — ESTRADIOL

## 2017-04-30 LAB — T4, FREE: Free T4: 0.9 ng/dL (ref 0.8–1.8)

## 2017-04-30 LAB — TSH: TSH: 4.07 m[IU]/L (ref 0.40–4.50)

## 2017-04-30 LAB — PROGESTERONE

## 2017-04-30 LAB — HEMOGLOBIN A1C
EAG (MMOL/L): 6 (calc)
Hgb A1c MFr Bld: 5.4 % of total Hgb (ref <5.7)
MEAN PLASMA GLUCOSE: 108 (calc)

## 2017-04-30 LAB — VITAMIN B1: VITAMIN B1 (THIAMINE): 13 nmol/L (ref 8–30)

## 2017-04-30 LAB — VITAMIN B6: Vitamin B6: 172.8 ng/mL — ABNORMAL HIGH (ref 2.1–21.7)

## 2017-05-18 ENCOUNTER — Ambulatory Visit (INDEPENDENT_AMBULATORY_CARE_PROVIDER_SITE_OTHER): Payer: Medicare HMO | Admitting: Family Medicine

## 2017-05-18 ENCOUNTER — Encounter: Payer: Self-pay | Admitting: Family Medicine

## 2017-05-18 VITALS — BP 111/63 | HR 84 | Ht 70.0 in | Wt 165.0 lb

## 2017-05-18 DIAGNOSIS — R748 Abnormal levels of other serum enzymes: Secondary | ICD-10-CM | POA: Diagnosis not present

## 2017-05-18 DIAGNOSIS — R5383 Other fatigue: Secondary | ICD-10-CM | POA: Insufficient documentation

## 2017-05-18 DIAGNOSIS — R7989 Other specified abnormal findings of blood chemistry: Secondary | ICD-10-CM

## 2017-05-18 DIAGNOSIS — R799 Abnormal finding of blood chemistry, unspecified: Secondary | ICD-10-CM | POA: Diagnosis not present

## 2017-05-18 DIAGNOSIS — I951 Orthostatic hypotension: Secondary | ICD-10-CM

## 2017-05-18 DIAGNOSIS — R42 Dizziness and giddiness: Secondary | ICD-10-CM

## 2017-05-18 DIAGNOSIS — E559 Vitamin D deficiency, unspecified: Secondary | ICD-10-CM | POA: Diagnosis not present

## 2017-05-18 NOTE — Progress Notes (Signed)
Subjective:    Patient ID: Nancy Parks, female    DOB: July 30, 1950, 67 y.o.   MRN: [id number]  HPI   Follow-up fatigue-patient had complained that for months she been feeling extremely fatigued even though she was trying to exercise and eat very healthy.  We did some additional lab work.  Cortisol levels were normal.  Interestingly her TSH was borderline at 4.0 but her free T4 was normal.  Plan was to repeat that in 8 weeks.  Of note her vitamin D levels were also a little borderline low and I have encouraged her to go ahead and start some vitamin D supplementation.  AST liver enzyme was also just slightly elevated.  No sign of diabetes.  Folate iron and magnesium were normal.  Blood count level was consistent from 5 months ago.  Female hormones consistent with menopause.   B6 were very elevated so encouraged her to stop any extra supplementation as she was taking.  She says she was not taking any type of supplement or vitamin that would have the B6 in it.  She is now on vita in D 5000 IU .    Dizziness -she did have a drop in blood pressure and we did orthostatics last week.  She still exercising and working out.  Elevated liver enzymes-she did start milk thistle about 2 months ago as recommended by her acupuncturist.  Review of Systems  BP 111/63   Pulse 84   Ht 5' 10"  (1.778 m)   Wt 165 lb (74.8 kg)   SpO2 100%   BMI 23.68 kg/m     Allergies  Allergen Reactions  . Latex Rash    REACTION: Rash   . Tape Rash  . Fosamax [Alendronate Sodium] Other (See Comments)    GI upset    Past Medical History:  Diagnosis Date  . Depression   . Headache(784.0)    Migraines  . Leukopenia   . Leukopenia 07/13/2012  . Multiple myeloma, without mention of having achieved remission 07/13/2012    Past Surgical History:  Procedure Laterality Date  . BASAL CELL CARCINOMA EXCISION  2013   removed from nose  . CATARACT EXTRACTION  09/2007  . LAPAROSCOPIC ENDOMETRIOSIS FULGURATION  1979     Social History   Socioeconomic History  . Marital status: Divorced    Spouse name: Not on file  . Number of children: Not on file  . Years of education: Not on file  . Highest education level: Not on file  Occupational History  . 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 Occupation: Economist  Social Needs  . Financial resource strain: Not on file  . Food insecurity:    Worry: Not on file    Inability: Not on file  . Transportation needs:    Medical: Not on file    Non-medical: Not on file  Tobacco Use  . Smoking status: Never Smoker  . Smokeless tobacco: Never Used  . Tobacco comment: never used tobacco  Substance and Sexual Activity  . Alcohol use: No    Alcohol/week: 0.0 oz  . Drug use: No  . Sexual activity: Yes    Partners: Male  Lifestyle  . Physical activity:    Days per week: Not on file    Minutes per session: Not on file  . Stress: Not on file  Relationships  . Social connections:    Talks on phone: Not on file    Gets together: Not on file  Attends religious service: Not on file    Active member of club or organization: Not on file    Attends meetings of clubs or organizations: Not on file    Relationship status: Not on file  . Intimate partner violence:    Fear of current or ex partner: Not on file    Emotionally abused: Not on file    Physically abused: Not on file    Forced sexual activity: Not on file  Other Topics Concern  . Not on file  Social History Narrative   No exercise.  Doing 10 min of yoga.     Family History  Problem Relation Age of Onset  . Breast cancer Unknown        grandmother   . Colon cancer Father        colon adenoma  . Hyperlipidemia Sister   . Hypertension Sister   . Cancer Sister        throat, smoker  . Melanoma Sister     Outpatient Encounter Medications as of 05/18/2017  Medication Sig  . AMBULATORY NON FORMULARY MEDICATION Take 750 mg by mouth 2 (two) times daily. Cura med-Curcumin  .  AMBULATORY NON FORMULARY MEDICATION Medication Name: digestive enzymes  . AMBULATORY NON FORMULARY MEDICATION every evening. Medication Name: spleen activator   . AMBULATORY NON FORMULARY MEDICATION Medication Name: 5 herbal energy- after meals  . AMBULATORY NON FORMULARY MEDICATION Medication Name: probiotics flora restore  . ASHWAGANDHA PO Take 380 mg by mouth daily.  Marland Kitchen CALCIUM & MAGNESIUM CARBONATES PO Take 4 capsules by mouth.  . Cholecalciferol 5000 units capsule Take 5,000 Units by mouth daily.  Marland Kitchen MILK THISTLE PO Take by mouth.  . nortriptyline (PAMELOR) 25 MG capsule Take 1 capsule (25 mg total) by mouth at bedtime.  . SUMAtriptan (IMITREX) 100 MG tablet Take 100 mg by mouth as needed for migraine.  . THYROID PO Take by mouth. Iodine 40m L-Tyrosine 4036m . TURMERIC CURCUMIN PO Take by mouth.  . [DISCONTINUED] cholecalciferol (VITAMIN D) 1000 units tablet Take 2,000 Units by mouth daily.   No facility-administered encounter medications on file as of 05/18/2017.          Objective:   Physical Exam  Constitutional: She is oriented to person, place, and time. She appears well-developed and well-nourished.  HENT:  Head: Normocephalic and atraumatic.  Cardiovascular: Normal rate, regular rhythm and normal heart sounds.  No carotid bruits.  Pulmonary/Chest: Effort normal and breath sounds normal.  Neurological: She is alert and oriented to person, place, and time.  Skin: Skin is warm and dry.  Psychiatric: She has a normal mood and affect. Her behavior is normal.          Assessment & Plan:  Fatigue- overall better.  Not completely resolved but she is better.  Vitamin D deficiency- now on 5000 IU daily and getting some sunlight.  Recheck in 6 weeks.  Elevated liver enzyme-due to recheck in 6 weeks.  She is going to hold the milk thistle she is been taking.  Abnormal thyroid level-due to recheck in 6 weeks.  She is can be hold the thyroid supplements she is been  taking.  Dizziness-consider further workup with echocardiogram. Improved.  Did have orthostatic hypotension on exam couple weeks ago.  Is encouraged to make sure she is hydrating really well.  She is not on any blood pressure lowering agents.  Excess B6-not currently on any supplementation.  Recheck level in 6 weeks again.  Due for  repeat mammogram

## 2017-05-18 NOTE — Patient Instructions (Signed)
Due for labs in 6 weeks. To recheck.

## 2017-05-21 ENCOUNTER — Encounter: Payer: Medicare HMO | Attending: Psychology | Admitting: Psychology

## 2017-05-21 DIAGNOSIS — R413 Other amnesia: Secondary | ICD-10-CM | POA: Insufficient documentation

## 2017-05-26 ENCOUNTER — Other Ambulatory Visit: Payer: Self-pay | Admitting: Family Medicine

## 2017-06-01 DIAGNOSIS — E559 Vitamin D deficiency, unspecified: Secondary | ICD-10-CM | POA: Diagnosis not present

## 2017-06-01 DIAGNOSIS — R7989 Other specified abnormal findings of blood chemistry: Secondary | ICD-10-CM | POA: Diagnosis not present

## 2017-06-01 DIAGNOSIS — R748 Abnormal levels of other serum enzymes: Secondary | ICD-10-CM | POA: Diagnosis not present

## 2017-06-01 DIAGNOSIS — R799 Abnormal finding of blood chemistry, unspecified: Secondary | ICD-10-CM | POA: Diagnosis not present

## 2017-06-02 ENCOUNTER — Other Ambulatory Visit: Payer: Self-pay | Admitting: *Deleted

## 2017-06-02 DIAGNOSIS — I951 Orthostatic hypotension: Secondary | ICD-10-CM

## 2017-06-02 MED ORDER — AMBULATORY NON FORMULARY MEDICATION: 0 refills | Status: DC

## 2017-06-04 ENCOUNTER — Encounter: Payer: Self-pay | Admitting: Family Medicine

## 2017-06-04 LAB — HEPATIC FUNCTION PANEL
AG RATIO: 1.7 (calc) (ref 1.0–2.5)
ALBUMIN MSPROF: 4.2 g/dL (ref 3.6–5.1)
ALT: 18 U/L (ref 6–29)
AST: 18 U/L (ref 10–35)
Alkaline phosphatase (APISO): 101 U/L (ref 33–130)
BILIRUBIN DIRECT: 0.1 mg/dL (ref 0.0–0.2)
BILIRUBIN INDIRECT: 0.5 mg/dL (ref 0.2–1.2)
BILIRUBIN TOTAL: 0.6 mg/dL (ref 0.2–1.2)
GLOBULIN: 2.5 g/dL (ref 1.9–3.7)
Total Protein: 6.7 g/dL (ref 6.1–8.1)

## 2017-06-04 LAB — TSH: TSH: 1.7 mIU/L (ref 0.40–4.50)

## 2017-06-04 LAB — VITAMIN D 25 HYDROXY (VIT D DEFICIENCY, FRACTURES): Vit D, 25-Hydroxy: 40 ng/mL (ref 30–100)

## 2017-06-04 LAB — VITAMIN B6: Vitamin B6: 148.1 ng/mL — ABNORMAL HIGH (ref 2.1–21.7)

## 2017-06-19 ENCOUNTER — Encounter: Payer: Self-pay | Admitting: Psychology

## 2017-06-19 NOTE — Progress Notes (Addendum)
Neuropsychological Consultation   Patient:   Nancy Parks   DOB:   [birth year]  MR Number:  [id number]  Location:  [facility] [address] [place] [id number] [place] [address] Dept: [phone]           Date of Service:   05/21/2017  Start Time:   8 AM End Time:   9 AM  Provider/Observer:  Ilean Skill, Psy.D.       Clinical Neuropsychologist       Billing Code/Service: Neurobehavioral status exam   Chief Complaint:    Nancy Parks is a 67 year old female referred by Hali Marry, , MD for neuropsychological evaluation due to increasing problems with memory long-term and short-term memory.  The patient also describes paraphasic errors and other spoken and written language issues.    Reason for Service:  The patient reports that over the past several years she has experienced episodes of severe migraines that had symptoms similar to a stroke appearance.  She had multiple ED visits.  The patient reports that she got her migraines under control with a "deep cleanse".  However, the patient reports that over the past several years she has had more difficultly with her memory as well as word finding issues.  Her short-term memory appears to be primary.  The patient describes both recall difficulties as well as actual storage and organization of memory.  The patient reports that she has been told by others about this initially but does not remember having people tell her things.  The patient reports that she has never had symptoms like this before.  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  The patient reports that she has dealt with depression since her husband left in 2012 after he had an affair.  The patient was diagnosed with depression in 2013 but feels like it  is been under control since 2016.  Her migraines started in 2000 12/2010 during stress from the marriage.  The patient describes her expressive language issues are related to word substitution both writing them down as well as saying them.  The patient reports that she will recognize these instances after rereading them.  Today during the clinical interview, I observed word fluency issues and the patient does report that she sees fluency issues about 40 or 50% of the time when talking to others.  The patient reports that when this happens that she avoids been around others.  The patient describes increased fatigue as well.  The patient describes her as being more introverted at work.  She lives alone and does not like talking on the phone.  She does have close friends and sees them 2 or 3 times a week.  The patient does describe some issues with geographic orientation that started over the past year it is only experienced significant event 1 or 2 times.  She reports it was also dark when this happened.  The patient reports that currently her mood is pretty good and that her migraines are under control.  Current Status:  The patient describes increasing memory difficulties and expressive language difficulties over the past year or 2.  Reliability of Information: The information is derived from 1 hour face-to-face clinical interview with the patient as well as review of available medical records.  Behavioral Observation: Nancy Parks  presents  as a 67 y.o.-year-old Right Caucasian Female who appeared her stated age. her dress was Appropriate and she was Well Groomed and her manners were Appropriate to the situation.  her participation was indicative of Appropriate behaviors.  There were not any physical disabilities noted.  she displayed an appropriate level of cooperation and motivation.     Interactions:    Active Appropriate  Attention:   within normal limits and attention span and concentration were  age appropriate  Memory:   abnormal; remote memory intact, recent memory impaired  Visuo-spatial:  not examined  Speech (Volume):  normal  Speech:   Patient does have some word finding verbal fluency issues.  She also has word replacement when she is writing.  She will identify this later with 3 reading text.  Thought Process:  Coherent  Though Content:  WNL; not suicidal and not homicidal  Orientation:   person, place, time/date and situation  Judgment:   Good  Planning:   Good  Affect:    Anxious  Mood:    Anxious  Insight:   Good  Intelligence:   normal  Marital Status/Living: The patient reports that she was born in the [facility] and has 4 siblings.  She was born from vaginal birth but is unsure of her birth weight.  Reports a developmental milestones of walking and talking were at the appropriate time.  The only major childhood illness was chickenpox.  She does not remember any unusual or significant medical issues.  The patient reports that she did have difficulty with reading and spelling could not say assess very well as a child.  She was treated for issues such as dyslexia as a child.  The patient father died in 24-Jan-1988 and her mother died in 23-May-2005.  They were both farmers as a profession.  Her father did have issues with alcohol depression.  The patient has no siblings.  Patient is divorced from her husband prior marriages.  Her first marriage lasted for 17 years her second marriage lasted for 23 years.  She has no children.  Current Employment: The patient works as an Optometrist.  Past Employment:  The patient worked in Geneticist, molecular previously.  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  Hobbies and interests include working in her yard and gardening.  Substance Use:  No concerns of substance abuse are reported.    Education:   Engineering geologist History:   Past Medical History:  Diagnosis Date  . Depression   . Headache(784.0)     Migraines  . Leukopenia   . Leukopenia 07/13/2012  . Multiple myeloma, without mention of having achieved remission 07/13/2012       @problemlist @       Abuse/Trauma History: While the patient denies any significant abuse or trauma through the years she does report that when her husband was discovered to be having an affair and left resulting in a divorce that this was very difficult and traumatic for her.  Psychiatric History:  The patient did have some adjustment issues around the time of her separation and divorce from her  Family Med/Psych History:  Family History  Problem Relation Age of Onset  . Breast cancer Unknown        grandmother   . Colon cancer Father        colon adenoma  . Hyperlipidemia Sister   . Hypertension Sister   . Cancer Sister        throat, smoker  .  Melanoma Sister     Risk of Suicide/Violence: low the patient denies any suicidal or homicidal ideation.  Impression/DX:  The patient reports that over the past several years she has experienced episodes of severe migraines that had symptoms similar to a stroke appearance.  She had multiple ED visits.  The patient reports that she got her migraines under control with a "deep cleanse".  However, the patient reports that over the past several years she has had more difficultly with her memory as well as word finding issues.  Her short-term memory appears to be primary.  The patient describes both recall difficulties as well as actual storage and organization of memory.  The patient reports that she has been told by others about this initially but does not remember having people tell her things.  The patient reports that she has never had symptoms like this before.  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  The patient reports that she has dealt with depression since her husband left in 2012 after he had an affair.   The patient was diagnosed with depression in 2013 but feels like it is been under control since 2016.  Her migraines started in 2000 12/2010 during stress from the marriage.  The patient describes her expressive language issues is related to word substitution both right and downwards as well as saying them.  The patient reports that she will recognize these instances after rereading them.  Today during the clinical interview I observed word fluency issues and the patient does report that she sees fluency issues about 40 or 50% of the time when talking to others.  The patient reports that when this happens that she avoids been around others.  The patient describes increased fatigue as well.  The patient describes her as being more introverted at work.  She lives alone and does not like talking on the phone.  She does have close friends and sees them 2 or 3 times a week.  The patient does describe some issues with geographic orientation that started over the past year it is only experienced significant event 1 or 2 times.  She reports it was also dark when this happened.  The patient reports that currently her mood is pretty good and that her migraines are under control.  The patient does describe a worsening memory, it some potential geographic disorientation and some changes in expressive language that have persisted or worsened over the past 2 years.  Disposition/Plan:  We have set the patient up for formal neuropsychological testing including the Wechsler Adult Intelligence Scale-IV as well as the Wechsler Memory Scale-IV.  Diagnosis:    Memory change         Electronically Signed   _______________________ Ilean Skill, Psy.D.

## 2017-06-23 ENCOUNTER — Other Ambulatory Visit: Payer: Self-pay | Admitting: Family Medicine

## 2017-06-23 DIAGNOSIS — Z1231 Encounter for screening mammogram for malignant neoplasm of breast: Secondary | ICD-10-CM

## 2017-06-30 ENCOUNTER — Telehealth: Payer: Self-pay | Admitting: Emergency Medicine

## 2017-06-30 NOTE — Telephone Encounter (Signed)
Pt advised and scheduled with Dr Georgina Snell for review.

## 2017-06-30 NOTE — Telephone Encounter (Signed)
Patient called to report ongoing dizziness with positional changes; taking her BPs and noticing 10-20mg  drop in BP when going from sitting to standing; wants to know what is next. Went over staging positional changes with pauses and deep breaths before movement. She will do this until she hears back from [name]; she has not actually fainted.

## 2017-06-30 NOTE — Telephone Encounter (Signed)
I do recommend that you return to clinic for recheck and reevaluation.  There is more testing that can be done to evaluate this issue further.  You not on any medications that should cause your blood pressure to drop like this.  I am happy to see you sooner otherwise schedule with Dr. Charise Carwin when she returns to clinic.

## 2017-07-01 ENCOUNTER — Ambulatory Visit (INDEPENDENT_AMBULATORY_CARE_PROVIDER_SITE_OTHER): Payer: Medicare HMO

## 2017-07-01 ENCOUNTER — Encounter: Payer: Self-pay | Admitting: Family Medicine

## 2017-07-01 ENCOUNTER — Ambulatory Visit (INDEPENDENT_AMBULATORY_CARE_PROVIDER_SITE_OTHER): Payer: Medicare HMO | Admitting: Family Medicine

## 2017-07-01 VITALS — BP 144/75 | HR 92 | Ht 70.0 in | Wt 158.0 lb

## 2017-07-01 DIAGNOSIS — Z1231 Encounter for screening mammogram for malignant neoplasm of breast: Secondary | ICD-10-CM

## 2017-07-01 DIAGNOSIS — I951 Orthostatic hypotension: Secondary | ICD-10-CM | POA: Diagnosis not present

## 2017-07-01 DIAGNOSIS — C9000 Multiple myeloma not having achieved remission: Secondary | ICD-10-CM | POA: Diagnosis not present

## 2017-07-01 NOTE — Progress Notes (Signed)
Nancy Parks is a 67 y.o. female who presents to [facility] today for orthostatic hypotension.  Nancy Parks has a pertinent medical history for multiple myeloma without dramatic symptoms.  She has been having episodes of lightheadedness or dizziness recently and had a partial work-up by her primary care provider recently.  She was asked to keep home blood pressure log and notes that she is been having episodes of orthostatic hypotension.  For example on May 12 her lying blood pressure was 112/69 with a heart rate of 79.  Her sitting blood pressure is 102/67 with a heart rate is 78 and her standing blood pressure was 79/56 without mention of heart rate.  She notes that she has several episodes like this over the last several days.  She denies any new medications.  She takes the medication and supplements listed below.  She notes most the time she is doing pretty well without episodes of dizziness.  She will have episodes of dizziness when she stands from seated position.  Additionally sometimes she will have dizziness with exercise.  She is exercising moderately with a personal trainer at the gym taking care to avoid excessive dizziness.  She denies palpitations or chest pain during exercise.  She has intentionally lost weight with improved diet and exercise.  She notes that she is due to follow-up with her oncology team next week.   Past Medical History:  Diagnosis Date  . Depression   . Headache(784.0)    Migraines  . Leukopenia   . Leukopenia 07/13/2012  . Multiple myeloma, without mention of having achieved remission 07/13/2012   Past Surgical History:  Procedure Laterality Date  . BASAL CELL CARCINOMA EXCISION  2013   removed from nose  . CATARACT EXTRACTION  09/2007  . LAPAROSCOPIC ENDOMETRIOSIS FULGURATION  1979   Social History   Tobacco Use  . Smoking status:  Never Smoker  . Smokeless tobacco: Never Used  . Tobacco comment: never used tobacco  Substance Use Topics  . Alcohol use: No    Alcohol/week: 0.0 oz   family history includes Breast cancer in her unknown relative; Cancer in her sister; Colon cancer in her father; Hyperlipidemia in her sister; Hypertension in her sister; Melanoma in her sister.  ROS as above:  Medications: Current Outpatient Medications  Medication Sig Dispense Refill  . AMBULATORY NON FORMULARY MEDICATION Take 750 mg by mouth 2 (two) times daily. Cura med-Curcumin    . AMBULATORY NON FORMULARY MEDICATION Medication Name: digestive enzymes    . AMBULATORY NON FORMULARY MEDICATION Medication Name: 5 herbal energy- after meals    . AMBULATORY NON FORMULARY MEDICATION Medication Name: probiotics flora restore    . AMBULATORY NON FORMULARY MEDICATION Medication Name: Blood Pressure monitor FAX: [phone] 1 each 0  . ASHWAGANDHA PO Take 380 mg by mouth daily.    Marland Kitchen CALCIUM & MAGNESIUM CARBONATES PO Take 4 capsules by mouth.    . Cholecalciferol 5000 units capsule Take 5,000 Units by mouth daily.    . nortriptyline (PAMELOR) 25 MG capsule TAKE ONE CAPSULE BY MOUTH EVERY NIGHT AT BEDTIME 90 capsule 2  . SUMAtriptan (IMITREX) 100 MG tablet Take 100 mg by mouth as needed for migraine.     No current facility-administered medications for this visit.    Allergies  Allergen Reactions  . Latex Rash    REACTION: Rash   . Tape Rash  . Fosamax [Alendronate Sodium] Other (See Comments)  GI upset    Health Maintenance Health Maintenance  Topic Date Due  . INFLUENZA VACCINE  12/28/2017 (Originally 09/17/2017)  . PNA vac Low Risk Adult (1 of 2 - PCV13) 05/06/2021 (Originally 04/01/2015)  . MAMMOGRAM  05/24/2018  . COLONOSCOPY  02/18/2019  . TETANUS/TDAP  07/11/2020  . DEXA SCAN  05/23/2021  . Hepatitis C Screening  Completed     Exam:  BP (!) 144/75   Pulse 92   Ht 5' 10"  (1.778 m)   Wt 158 lb (71.7 kg)   BMI  22.67 kg/m  Gen: Well NAD HEENT: EOMI,  MMM Lungs: Normal work of breathing. CTABL Heart: RRR no MRG Abd: NABS, Soft. Nondistended, Nontender Exts: Brisk capillary refill, warm and well perfused.   Twelve-lead EKG shows normal sinus rhythm.  Less than 1 mm ST depression at V3 no ST elevation.  No other significant ST depression.  1 mm Q waves at V4, V5, and V6 and aVF and lead II. lateral precordial T waves are slightly flattened but not inverted.  EKG otherwise unremarkable.    Assessment and Plan: 67 y.o. female with  Orthostasis: Patient likely is having episodes of orthostatic hypotension.  I reviewed her medication and supplement list and do not see any likely medications.  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  She has a relatively normal cortisol level recently however I think should benefit from ACTH stimulation test.  Additionally she is having some of her dizziness episodes with exercise.  I think is probably reasonable to proceed with a cardiac evaluation likely stress test possibly also tilt table testing.  Cardiology referral ordered.  Follow-up with oncology as scheduled.    After discussion with nurse at endocrinology office plan for referral to endocrinology for evaluation and ACTH stimulation test likely.   Orders Placed This Encounter  Procedures  . Ambulatory referral to Cardiology    Referral Priority:   Routine    Referral Type:   Consultation    Referral Reason:   Specialty Services Required    Requested Specialty:   Cardiology    Number of Visits Requested:   1  . EKG    Standing Status:   Future    Standing Expiration Date:   07/01/2018   No orders of the defined types were placed in this encounter.    Discussed warning signs or symptoms. Please see discharge instructions. Patient expresses understanding.

## 2017-07-01 NOTE — Patient Instructions (Addendum)
Thank you for coming in today. You should hear from cardiology.  Recheck with Dr Suzi Roots.  Return sooner if needed.   OK to continue moderate exercise especially supervised.  STOP if you have significant chest pain or shortness of breath.    Orthostatic Hypotension Orthostatic hypotension is a sudden drop in blood pressure that happens when you quickly change positions, such as when you get up from a seated or lying position. Blood pressure is a measurement of how strongly, or weakly, your blood is pressing against the walls of your arteries. Arteries are blood vessels that carry blood from your heart throughout your body. When blood pressure is too low, you may not get enough blood to your brain or to the rest of your organs. This can cause weakness, light-headedness, rapid heartbeat, and fainting. This can last for just a few seconds or for up to a few minutes. Orthostatic hypotension is usually not a serious problem. However, if it happens frequently or gets worse, it may be a sign of something more serious. What are the causes? This condition may be caused by:  Sudden changes in posture, such as standing up quickly after you have been sitting or lying down.  Blood loss.  Loss of body fluids (dehydration).  Heart problems.  Hormone (endocrine) problems.  Pregnancy.  Severe infection.  Lack of certain nutrients.  Severe allergic reactions (anaphylaxis).  Certain medicines, such as blood pressure medicine or medicines that make the body lose excess fluids (diuretics). Sometimes, this condition can be caused by not taking medicine as directed, such as taking too much of a certain medicine.  What increases the risk? Certain factors can make you more likely to develop orthostatic hypotension, including:  Age. Risk increases as you get older.  Conditions that affect the heart or the central nervous system.  Taking certain medicines, such as blood pressure medicine or  diuretics.  Being pregnant.  What are the signs or symptoms? Symptoms of this condition may include:  Weakness.  Light-headedness.  Dizziness.  Blurred vision.  Fatigue.  Rapid heartbeat.  Fainting, in severe cases.  How is this diagnosed? This condition is diagnosed based on:  Your medical history.  Your symptoms.  Your blood pressure measurement. Your health care provider will check your blood pressure when you are: ? Lying down. ? Sitting. ? Standing.  A blood pressure reading is recorded as two numbers, such as "120 over 80" (or 120/80). The first ("top") number is called the systolic pressure. It is a measure of the pressure in your arteries as your heart beats. The second ("bottom") number is called the diastolic pressure. It is a measure of the pressure in your arteries when your heart relaxes between beats. Blood pressure is measured in a unit called mm Hg. Healthy blood pressure for adults is 120/80. If your blood pressure is below 90/60, you may be diagnosed with hypotension. Other information or tests that may be used to diagnose orthostatic hypotension include:  Your other vital signs, such as your heart rate and temperature.  Blood tests.  Tilt table test. For this test, you will be safely secured to a table that moves you from a lying position to an upright position. Your heart rhythm and blood pressure will be monitored during the test.  How is this treated? Treatment for this condition may include:  Changing your diet. This may involve eating more salt (sodium) or drinking more water.  Taking medicines to raise your blood pressure.  Changing the  dosage of certain medicines you are taking that might be lowering your blood pressure.  Wearing compression stockings. These stockings help to prevent blood clots and reduce swelling in your legs.  In some cases, you may need to go to the hospital for:  Fluid replacement. This means you will receive  fluids through an IV tube.  Blood replacement. This means you will receive donated blood through an IV tube (transfusion).  Treating an infection or heart problems, if this applies.  Monitoring. You may need to be monitored while medicines that you are taking wear off.  Follow these instructions at home: Eating and drinking   Drink enough fluid to keep your urine clear or pale yellow.  Eat a healthy diet and follow instructions from your health care provider about eating or drinking restrictions. A healthy diet includes: ? Fresh fruits and vegetables. ? Whole grains. ? Lean meats. ? Low-fat dairy products.  Eat extra salt only as directed. Do not add extra salt to your diet unless your health care provider told you to do that.  Eat frequent, small meals.  Avoid standing up suddenly after eating. Medicines  Take over-the-counter and prescription medicines only as told by your health care provider. ? Follow instructions from your health care provider about changing the dosage of your current medicines, if this applies. ? Do not stop or adjust any of your medicines on your own. General instructions  Wear compression stockings as told by your health care provider.  Get up slowly from lying down or sitting positions. This gives your blood pressure a chance to adjust.  Avoid hot showers and excessive heat as directed by your health care provider.  Return to your normal activities as told by your health care provider. Ask your health care provider what activities are safe for you.  Do not use any products that contain nicotine or tobacco, such as cigarettes and e-cigarettes. If you need help quitting, ask your health care provider.  Keep all follow-up visits as told by your health care provider. This is important. Contact a health care provider if:  You vomit.  You have diarrhea.  You have a fever for more than 2-3 days.  You feel more thirsty than usual.  You feel weak  and tired. Get help right away if:  You have chest pain.  You have a fast or irregular heartbeat.  You develop numbness in any part of your body.  You cannot move your arms or your legs.  You have trouble speaking.  You become sweaty or feel lightheaded.  You faint.  You feel short of breath.  You have trouble staying awake.  You feel confused. This information is not intended to replace advice given to you by your health care provider. Make sure you discuss any questions you have with your health care provider. Document Released: 01/24/2002 Document Revised: 10/23/2015 Document Reviewed: 07/27/2015 Elsevier Interactive Patient Education  2018 [employer].

## 2017-07-01 NOTE — Addendum Note (Signed)
Addended by: Huel Cote on: 07/01/2017 03:46 PM   Modules accepted: Orders

## 2017-07-06 ENCOUNTER — Other Ambulatory Visit: Payer: Self-pay

## 2017-07-06 ENCOUNTER — Inpatient Hospital Stay: Payer: Medicare HMO | Attending: Hematology & Oncology

## 2017-07-06 ENCOUNTER — Inpatient Hospital Stay (HOSPITAL_BASED_OUTPATIENT_CLINIC_OR_DEPARTMENT_OTHER): Payer: Medicare HMO | Admitting: Family

## 2017-07-06 ENCOUNTER — Encounter: Payer: Self-pay | Admitting: Family

## 2017-07-06 VITALS — BP 123/73 | HR 79 | Temp 98.7°F | Resp 16 | Wt 157.0 lb

## 2017-07-06 DIAGNOSIS — Z7982 Long term (current) use of aspirin: Secondary | ICD-10-CM

## 2017-07-06 DIAGNOSIS — R42 Dizziness and giddiness: Secondary | ICD-10-CM | POA: Diagnosis not present

## 2017-07-06 DIAGNOSIS — R5383 Other fatigue: Secondary | ICD-10-CM | POA: Diagnosis not present

## 2017-07-06 DIAGNOSIS — D72818 Other decreased white blood cell count: Secondary | ICD-10-CM

## 2017-07-06 DIAGNOSIS — R413 Other amnesia: Secondary | ICD-10-CM

## 2017-07-06 DIAGNOSIS — D72819 Decreased white blood cell count, unspecified: Secondary | ICD-10-CM

## 2017-07-06 DIAGNOSIS — C9000 Multiple myeloma not having achieved remission: Secondary | ICD-10-CM | POA: Insufficient documentation

## 2017-07-06 DIAGNOSIS — D708 Other neutropenia: Secondary | ICD-10-CM

## 2017-07-06 LAB — CBC WITH DIFFERENTIAL (CANCER CENTER ONLY)
BASOS ABS: 0 10*3/uL (ref 0.0–0.1)
Basophils Relative: 1 %
EOS PCT: 1 %
Eosinophils Absolute: 0 10*3/uL (ref 0.0–0.5)
HCT: 38.8 % (ref 34.8–46.6)
Hemoglobin: 13.3 g/dL (ref 11.6–15.9)
Lymphocytes Relative: 62 %
Lymphs Abs: 1.1 10*3/uL (ref 0.9–3.3)
MCH: 33.8 pg (ref 26.0–34.0)
MCHC: 34.3 g/dL (ref 32.0–36.0)
MCV: 98.7 fL (ref 81.0–101.0)
MONO ABS: 0.1 10*3/uL (ref 0.1–0.9)
MONOS PCT: 5 %
Neutro Abs: 0.6 10*3/uL — ABNORMAL LOW (ref 1.5–6.5)
Neutrophils Relative %: 31 %
PLATELETS: 265 10*3/uL (ref 145–400)
RBC: 3.93 MIL/uL (ref 3.70–5.32)
RDW: 13 % (ref 11.1–15.7)
WBC Count: 1.8 10*3/uL — ABNORMAL LOW (ref 3.9–10.0)

## 2017-07-06 LAB — CMP (CANCER CENTER ONLY)
ALT: 22 U/L (ref 0–55)
ANION GAP: 5 (ref 3–11)
AST: 21 U/L (ref 5–34)
Albumin: 4.2 g/dL (ref 3.5–5.0)
Alkaline Phosphatase: 110 U/L (ref 40–150)
BUN: 10 mg/dL (ref 7–26)
CHLORIDE: 105 mmol/L (ref 98–109)
CO2: 29 mmol/L (ref 22–29)
Calcium: 9.7 mg/dL (ref 8.4–10.4)
Creatinine: 1.02 mg/dL (ref 0.60–1.10)
GFR, EST NON AFRICAN AMERICAN: 56 mL/min — AB (ref >60)
Glucose, Bld: 81 mg/dL (ref 70–140)
POTASSIUM: 4.8 mmol/L (ref 3.5–5.1)
Sodium: 139 mmol/L (ref 136–145)
Total Bilirubin: 0.6 mg/dL (ref 0.2–1.2)
Total Protein: 7.2 g/dL (ref 6.4–8.3)

## 2017-07-06 LAB — SAVE SMEAR

## 2017-07-06 NOTE — Progress Notes (Signed)
Hematology and Oncology Follow Up Visit  Kirsten Spearing [id number] 1950-12-28 67 y.o. 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 Hockenbury is here today for follow-up. She has had some fatigue, memory issues and lightheadedness when in certain positions.  She has an appointment with cardiology next week to discuss the lightheadedness and an appointment with neurology psychology for the memory loss. She is not staying asleep well.  She has had no falls or syncopal episodes.  She is a little tearful today while we talk and states that she has a hard time talking about not feeling well.  Her M-spike in September was 1.1, IgG level 1,247 mg/dL and lambda light chain 0.87 mg/dL.  Her WBC count is 1.8 today, ANC 0.6.  No fever, chills, n/v, cough, rash, dizziness, SOB, chest pain, palpitations, abdominal pain or changes in bowel or bladder habits.  No swelling, tenderness, numbness or tingling in her extremities. No c/o pain.  No episodes of bleeding, no bruising or petechiae. No lymphadenopathy noted on exam.  She has maintained a good appetite and is eating healthier, smaller portions. She is staying well hydrated. Her weight is down 16 lbs since her last visit in September.   ECOG Performance Status: 1 - Symptomatic but completely ambulatory  Medications:  Allergies as of 07/06/2017      Reactions   Latex Rash   REACTION: Rash REACTION: Rash   Other Rash   Tape Rash   Fosamax [alendronate Sodium] Other (See Comments)   GI upset      Medication List        Accurate as of 07/06/17 11:28 AM. Always use your most recent med list.          AMBULATORY NON FORMULARY MEDICATION Take 750 mg by mouth 2 (two) times daily. Cura med-Curcumin   AMBULATORY NON FORMULARY MEDICATION Medication Name: digestive enzymes   AMBULATORY NON FORMULARY  MEDICATION Medication Name: 5 herbal energy- after meals   AMBULATORY NON FORMULARY MEDICATION Medication Name: probiotics flora restore   AMBULATORY NON FORMULARY MEDICATION Medication Name: Blood Pressure monitor FAX: [phone]   CALCIUM & MAGNESIUM CARBONATES PO Take 4 capsules by mouth.   Cholecalciferol 5000 units capsule Take 5,000 Units by mouth daily.   IMITREX 100 MG tablet Generic drug:  SUMAtriptan Take 100 mg by mouth as needed for migraine.   nortriptyline 25 MG capsule Commonly known as:  PAMELOR TAKE ONE CAPSULE BY MOUTH EVERY NIGHT AT BEDTIME       Allergies:  Allergies  Allergen Reactions  . Latex Rash    REACTION: Rash  REACTION: Rash  . Other Rash  . Tape Rash  . Fosamax [Alendronate Sodium] Other (See Comments)    GI upset    Past Medical History, Surgical history, Social history, and Family History were reviewed and updated.  Review of Systems: All other 10 point review of systems is negative.   Physical Exam:  weight is 157 lb (71.2 kg). Her oral temperature is 98.7 F (37.1 C). Her blood pressure is 123/73 and her pulse is 79. Her respiration is 16 and oxygen saturation is 99%.   Wt Readings from Last 3 Encounters:  07/06/17 157 lb (71.2 kg)  07/01/17 158 lb (71.7 kg)  05/18/17 165 lb (74.8 kg)    Ocular: Sclerae unicteric, pupils equal, round and reactive to light Ear-nose-throat: Oropharynx clear, dentition fair  Lymphatic: No cervical, supraclavicular or axillary adenopathy Lungs no rales or rhonchi, good excursion bilaterally Heart regular rate and rhythm, no murmur appreciated Abd soft, nontender, positive bowel sounds, no liver or spleen tip palpated on exam, no fluid wave  MSK no focal spinal tenderness, no joint edema Neuro: non-focal, well-oriented, appropriate affect Breasts: Deferred   Lab Results  Component Value Date   WBC 1.8 (L) 07/06/2017   HGB 13.3 07/06/2017   HCT 38.8 07/06/2017   MCV 98.7 07/06/2017    PLT 265 07/06/2017   Lab Results  Component Value Date   FERRITIN 39 04/27/2017   IRON 65 04/27/2017   TIBC 275 04/27/2017   IRONPCTSAT 24 04/27/2017   Lab Results  Component Value Date   RETICCTPCT 1.3 06/04/2012   RBC 3.93 07/06/2017   RETICCTABS 46.0 06/04/2012   Lab Results  Component Value Date   KPAFRELGTCHN 1.05 11/07/2014   LAMBDASER 1.15 11/07/2014   KAPLAMBRATIO 0.60 11/06/2016   Lab Results  Component Value Date   IGGSERUM 1,247 11/06/2016   IGA 71 11/07/2014   IGMSERUM 82 11/06/2016   Lab Results  Component Value Date   TOTALPROTELP 7.0 11/07/2014   ALBUMINELP 4.4 11/07/2014   A1GS 0.2 11/07/2014   A2GS 0.6 11/07/2014   BETS 0.4 11/07/2014   BETA2SER 0.3 11/07/2014   GAMS 1.1 11/07/2014   MSPIKE 1.1 (H) 11/06/2016   SPEI * 11/07/2014     Chemistry      Component Value Date/Time   NA 140 04/27/2017 1551   NA 141 02/29/2016 1318   K 4.2 04/27/2017 1551   K 3.9 02/29/2016 1318   CL 105 04/27/2017 1551   CL 103 02/28/2015 1523   CL 104 01/19/2014 0908   CO2 30 04/27/2017 1551   CO2 28 02/29/2016 1318   BUN 11 04/27/2017 1551   BUN 10.7 02/29/2016 1318   CREATININE 0.97 04/27/2017 1551   CREATININE 0.9 02/29/2016 1318      Component Value Date/Time   CALCIUM 9.6 04/27/2017 1551   CALCIUM 9.5 02/29/2016 1318   ALKPHOS 147 02/29/2016 1318   AST 18 06/01/2017 1227   AST 17 02/29/2016 1318   ALT 18 06/01/2017 1227   ALT 15 02/29/2016 1318   BILITOT 0.6 06/01/2017 1227   BILITOT 0.38 02/29/2016 1318       Impression and Plan: Ms. Hassinger is a very pleasant 67 yo caucasian female with IgG lambda smoldering myeloma. Her WBC count is 1.8 today, Myloma studies are pending.  She has had some memory issues along with fatigue and lightheadedness and will be worked up by cardiology and neurology psychology.  We will plan to see her back in another 8 months for follow-up sooner if needed depending on her lab work.  She will contact our office with any  questions or concerns. We can certainly see her sooner if need be.      Laverna Peace, NP 5/20/201911:28 AM

## 2017-07-07 LAB — KAPPA/LAMBDA LIGHT CHAINS
Kappa free light chain: 10.3 mg/L (ref 3.3–19.4)
Kappa, lambda light chain ratio: 0.79 (ref 0.26–1.65)
Lambda free light chains: 13.1 mg/L (ref 5.7–26.3)

## 2017-07-07 LAB — IGG, IGA, IGM
IgA: 41 mg/dL — ABNORMAL LOW (ref 87–352)
IgG (Immunoglobin G), Serum: 1392 mg/dL (ref 700–1600)
IgM (Immunoglobulin M), Srm: 85 mg/dL (ref 26–217)

## 2017-07-10 LAB — PROTEIN ELECTROPHORESIS, SERUM, WITH REFLEX
A/G RATIO SPE: 1.3 (ref 0.7–1.7)
Albumin ELP: 3.9 g/dL (ref 2.9–4.4)
Alpha-1-Globulin: 0.2 g/dL (ref 0.0–0.4)
Alpha-2-Globulin: 0.6 g/dL (ref 0.4–1.0)
Beta Globulin: 0.8 g/dL (ref 0.7–1.3)
GAMMA GLOBULIN: 1.3 g/dL (ref 0.4–1.8)
GLOBULIN, TOTAL: 2.9 g/dL (ref 2.2–3.9)
M-SPIKE, %: 1 g/dL — AB
SPEP INTERP: 0
Total Protein ELP: 6.8 g/dL (ref 6.0–8.5)

## 2017-07-10 LAB — IMMUNOFIXATION REFLEX, SERUM
IGM (IMMUNOGLOBULIN M), SRM: 80 mg/dL (ref 26–217)
IgA: 42 mg/dL — ABNORMAL LOW (ref 87–352)
IgG (Immunoglobin G), Serum: 1356 mg/dL (ref 700–1600)

## 2017-07-14 DIAGNOSIS — R69 Illness, unspecified: Secondary | ICD-10-CM | POA: Diagnosis not present

## 2017-07-15 ENCOUNTER — Encounter: Payer: Self-pay | Admitting: Cardiology

## 2017-07-15 ENCOUNTER — Ambulatory Visit: Payer: Medicare HMO | Admitting: Cardiology

## 2017-07-15 VITALS — BP 120/74 | HR 74 | Ht 70.0 in | Wt 158.0 lb

## 2017-07-15 DIAGNOSIS — R0609 Other forms of dyspnea: Secondary | ICD-10-CM | POA: Diagnosis not present

## 2017-07-15 DIAGNOSIS — I951 Orthostatic hypotension: Secondary | ICD-10-CM

## 2017-07-15 DIAGNOSIS — C9000 Multiple myeloma not having achieved remission: Secondary | ICD-10-CM | POA: Diagnosis not present

## 2017-07-15 NOTE — Progress Notes (Signed)
Cardiology Consultation:    Date:  07/15/2017   ID:  Nancy Parks, DOB Sep 30, 1950, MRN [id number]  PCP:  Hali Marry, MD  Cardiologist:  Jenne Campus, MD   Referring MD: Gregor Hams, MD   Chief Complaint  Patient presents with  . Orthostasis    checking BP for past month  . Fatigue  . Dizziness    for past 6 months  I have dizziness while getting up  History of Present Illness:    Nancy Parks is a 67 y.o. female who is being seen today for the evaluation of orthostatic hypotension at the request of Gregor Hams, MD.  For last few months she noted when she gets up quickly she will get dizzy she never passed out she never fell down but sometimes she had to slow down and wait until the sensation goes away.  She check her blood pressure on the regular basis and she noted when she gets up her blood pressure goes down by 20 points.  Denies have any chest pain tightness squeezing pressure burning chest does have exertional shortness of breath.  She goes to gym since December she is trying to build up her stamina she does weightlifting and only once a week some aerobic exercise but had difficulty doing it because of shortness of breath. Denies have any palpitations no tightness squeezing pressure burning chest. Does have a history of multiple myeloma treated with chemotherapy however she had some difficulty tolerating therapy and now she is simply being monitored.  Past Medical History:  Diagnosis Date  . Depression   . Headache(784.0)    Migraines  . Leukopenia   . Leukopenia 07/13/2012  . Multiple myeloma, without mention of having achieved remission 07/13/2012    Past Surgical History:  Procedure Laterality Date  . BASAL CELL CARCINOMA EXCISION  2013   removed from nose  . CATARACT EXTRACTION  09/2007  . LAPAROSCOPIC ENDOMETRIOSIS FULGURATION  1979    Current Medications: Current Meds  Medication Sig  . AMBULATORY NON FORMULARY MEDICATION Take 750 mg by mouth 2  (two) times daily. Cura med-Curcumin  . AMBULATORY NON FORMULARY MEDICATION Medication Name: digestive enzymes  . AMBULATORY NON FORMULARY MEDICATION Medication Name: 5 herbal energy- after meals  . AMBULATORY NON FORMULARY MEDICATION Medication Name: Blood Pressure monitor FAX: [phone]  . CALCIUM & MAGNESIUM CARBONATES PO Take 4 capsules by mouth.  . Cholecalciferol 5000 units capsule Take 5,000 Units by mouth daily.  . nortriptyline (PAMELOR) 25 MG capsule TAKE ONE CAPSULE BY MOUTH EVERY NIGHT AT BEDTIME  . OVER THE COUNTER MEDICATION Take 500 mg by mouth daily. Triphala  . SUMAtriptan (IMITREX) 100 MG tablet Take 100 mg by mouth as needed for migraine.     Allergies:   Latex; Other; Tape; and Fosamax [alendronate sodium]   Social History   Socioeconomic History  . Marital status: Divorced    Spouse name: Not on file  . Number of children: Not on file  . Years of education: Not on file  . Highest education level: Not on file  Occupational History  . Occupation: Aeronautical engineer: [employer]  . Occupation: Economist  Social Needs  . Financial resource strain: Not on file  . Food insecurity:    Worry: Not on file    Inability: Not on file  . Transportation needs:    Medical: Not on file    Non-medical: Not on file  Tobacco Use  . Smoking status: Never  Smoker  . Smokeless tobacco: Never Used  . Tobacco comment: never used tobacco  Substance and Sexual Activity  . Alcohol use: No    Alcohol/week: 0.0 oz  . Drug use: No  . Sexual activity: Yes    Partners: Male  Lifestyle  . Physical activity:    Days per week: Not on file    Minutes per session: Not on file  . Stress: Not on file  Relationships  . Social connections:    Talks on phone: Not on file    Gets together: Not on file    Attends religious service: Not on file    Active member of club or organization: Not on file    Attends meetings of clubs or organizations: Not on file    Relationship  status: Not on file  Other Topics Concern  . Not on file  Social History Narrative   No exercise.  Doing 10 min of yoga.      Family History: The patient's family history includes Breast cancer in her unknown relative; Cancer in her sister; Colon cancer in her father; Hyperlipidemia in her sister; Hypertension in her sister; Melanoma in her sister. ROS:   Please see the history of present illness.    All 14 point review of systems negative except as described per history of present illness.  EKGs/Labs/Other Studies Reviewed:    The following studies were reviewed today:   EKG:  EKG is  ordered today.  The ekg ordered today demonstrates normal sinus rhythm normal P interval normal QS complex duration morphology no ST segment changes  Recent Labs: 04/27/2017: Magnesium 2.3 06/01/2017: TSH 1.70 07/06/2017: ALT 22; BUN 10; Creatinine 1.02; Hemoglobin 13.3; Platelet Count 265; Potassium 4.8; Sodium 139  Recent Lipid Panel    Component Value Date/Time   CHOL 187 05/23/2016 0918   TRIG 105 05/23/2016 0918   HDL 47 (L) 05/23/2016 0918   CHOLHDL 4.0 05/23/2016 0918   VLDL 26 10/01/2015 1037   LDLCALC 124 10/01/2015 1037    Physical Exam:    VS:  BP 120/74   Pulse 74   Ht _0  (1.778 m)   Wt 158 lb (71.7 kg)   SpO2 98%   BMI 22.67 kg/m     Wt Readings from Last 3 Encounters:  07/15/17 158 lb (71.7 kg)  07/06/17 157 lb (71.2 kg)  07/01/17 158 lb (71.7 kg)     GEN:  Well nourished, well developed in no acute distress HEENT: Normal NECK: No JVD; No carotid bruits LYMPHATICS: No lymphadenopathy CARDIAC: RRR, no murmurs, no rubs, no gallops RESPIRATORY:  Clear to auscultation without rales, wheezing or rhonchi  ABDOMEN: Soft, non-tender, non-distended MUSCULOSKELETAL:  No edema; No deformity  SKIN: Warm and dry NEUROLOGIC:  Alert and oriented x 3 PSYCHIATRIC:  Normal affect   ASSESSMENT:    1. Multiple myeloma, remission status unspecified ([place])   2. Orthostatic  hypotension   3. Dyspnea on exertion    PLAN:    In order of problems listed above:  1. Orthostatic hypotension: We will ask her to have her blood pressure rechecked today to check for orthostasis I suspect she may have some dysautonomia related to her multiple myeloma or chemotherapy.  I will ask her to have echocardiogram done to check left ventricular ejection fraction we talked about measures that can be taken to alleviate symptoms of orthostatic hypotension meaning drinking plenty of fluids and eating more salty food she will try to do that.  After  echocardiogram will be done hopefully will have encouraging results and then will talk more about doing aerobic exercises. 2. Dyspnea on exertion: We will do echocardiogram to assess left ventricular ejection fraction 3. Multiple myeloma: Followed by internal medicine team and oncology.   Medication Adjustments/Labs and Tests Ordered: Current medicines are reviewed at length with the patient today.  Concerns regarding medicines are outlined above.  No orders of the defined types were placed in this encounter.  No orders of the defined types were placed in this encounter.   Signed, Park Liter, MD, [facility]. 07/15/2017 4:52 PM    [place]

## 2017-07-15 NOTE — Patient Instructions (Signed)
Medication Instructions:  Your physician recommends that you continue on your current medications as directed. Please refer to the Current Medication list given to you today.   Labwork: None  Testing/Procedures: You had an EKG today.   Your physician has requested that you have an echocardiogram. Echocardiography is a painless test that uses sound waves to create images of your heart. It provides your doctor with information about the size and shape of your heart and how well your heart's chambers and valves are working. This procedure takes approximately one hour. There are no restrictions for this procedure.  Follow-Up: Your physician recommends that you schedule a follow-up appointment in: 1 month.  If you need a refill on your cardiac medications before your next appointment, please call your pharmacy.   Thank you for choosing [facility]! Conni Knighton, RN [phone]    

## 2017-07-16 ENCOUNTER — Other Ambulatory Visit: Payer: Self-pay | Admitting: *Deleted

## 2017-07-16 DIAGNOSIS — R0609 Other forms of dyspnea: Secondary | ICD-10-CM

## 2017-07-23 ENCOUNTER — Encounter: Payer: Self-pay | Admitting: Psychology

## 2017-07-23 ENCOUNTER — Encounter: Payer: Medicare HMO | Attending: Psychology | Admitting: Psychology

## 2017-07-23 DIAGNOSIS — R413 Other amnesia: Secondary | ICD-10-CM | POA: Diagnosis not present

## 2017-07-23 DIAGNOSIS — G43101 Migraine with aura, not intractable, with status migrainosus: Secondary | ICD-10-CM | POA: Diagnosis not present

## 2017-07-23 NOTE — Progress Notes (Signed)
Today I personally administered the Wechsler Adult Intelligence Scale-IV as well as the Wechsler Memory Scale-IV.  The patient appeared to fully participate in these testing procedures.  The patient was alert and engaged throughout the testing procedures.  This does appear to be a fair and valid sample of current functioning.  During the next visit scoring, interpretation and report writing will be completed.

## 2017-07-30 ENCOUNTER — Encounter (HOSPITAL_BASED_OUTPATIENT_CLINIC_OR_DEPARTMENT_OTHER): Payer: Medicare HMO | Admitting: Psychology

## 2017-07-30 DIAGNOSIS — R413 Other amnesia: Secondary | ICD-10-CM

## 2017-07-30 DIAGNOSIS — G43101 Migraine with aura, not intractable, with status migrainosus: Secondary | ICD-10-CM | POA: Diagnosis not present

## 2017-07-31 NOTE — Progress Notes (Addendum)
Patient:  Christel Bai   DOB: [birth year]  MR Number: [id number]  Location: [facility] FOR PAIN AND [facility] MEDICINE [facility] PHYSICAL MEDICINE AND REHABILITATION [address], [place] [address] Dept: [phone]  Start: 11 AM End: 12 PM  Provider/Observer:     Edgardo Roys PsyD  Chief Complaint:      Chief Complaint  Patient presents with  . Memory Loss  . Headache    Reason for Service:                   Bridgitt Raggio reports that over the past several years she has experienced episodes of severe migraines that had symptoms similar to a stroke appearance.  She had multiple ED visits.  The patient reports that she got her migraines under control with a "deep cleanse".  However, the patient reports that over the past several years she has had more difficultly with her memory as well as word finding issues.  Her short-term memory appears to be primary.  The patient describes both recall difficulties as well as actual storage and organization of memory.  The patient reports that she has been told by others about this initially but does not remember having people tell her things.  The patient reports that she has never had symptoms like this before.  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  The patient reports that she has dealt with depression since her husband left in 2012 after he had an affair.  The patient was diagnosed with depression in 2013 but feels like it is been under control since 2016.  Her migraines started in 2000 12/2010 during stress from the marriage.  The patient describes her expressive language issues are related to word substitution both writing them down as well as saying them.  The patient reports that she will recognize these instances after rereading them.  Today during the clinical interview, I observed  word fluency issues and the patient does report that she sees fluency issues about 40 or 50% of the time when talking to others.  The patient reports that when this happens that she avoids been around others.  The patient describes increased fatigue as well.  The patient describes her as being more introverted at work.  She lives alone and does not like talking on the phone.  She does have close friends and sees them 2 or 3 times a week.  The patient does describe some issues with geographic orientation that started over the past year it is only experienced significant event 1 or 2 times.  She reports it was also dark when this happened.  The patient reports that currently her mood is pretty good and that her migraines are under control.  Testing Administered:  The patient was administered the Wechsler Adult Intelligence Scale-IV as well as the Wechsler Memory Scale-IV.  Participation Level:   Active  Participation Quality:  Appropriate and Attentive      Behavioral Observation:  Well Groomed, Alert, and Appropriate.   Test Results:   The patient was administered the Wechsler Adult Intelligence Scale-IV as well as the Wechsler Memory Scale-IV.  The patient appeared to fully participate and try her hardest throughout the testing situation.  This does appear to be a fair and valid sample of her current neuropsychological functioning.  Initially, the patient was administered the Wechsler Adult  Intelligence Scale-IV.  Given the patient's premorbid history of college degree in work as an Optometrist I would predict that her premorbid full-scale IQ store would be somewhere between 110 and 120.  Composite Score Summary  Scale Sum of Scaled Scores Composite Score Percentile Rank 95% Conf. Interval Qualitative Description  Verbal Comprehension 39 VCI 116 86 110-121 High Average  Perceptual Reasoning 29 PRI 98 45 92-104 Average  Working Memory 15 WMI 86 18 80-94 Low Average  Processing Speed 23 PSI 108  70 99-116 Average  Full Scale 106 FSIQ 104 61 100-108 Average  General Ability 68 GAI 107 68 102-112 Average   The patient produced a full scale IQ score of 104 which is in the average range and at the 61st percentile.  This is slightly below predicted levels based on her education and occupational history.  The patient produced a global abilities index score of 107, which is at the 68th percentile and also in the average range.  Verbal comprehension produced a composite score of 116 which falls at the 86 percentile and is in the high average range and likely the most indicative and predictive of her premorbid functioning.  Perceptual reasoning index score of 98 which is in the average range and at the 45th percentile.  Working memory index score was 86 and at the 18th percentile and in the low average range.  Processing speed index score of 108 was at the [facility] and in the average range.  Overall, this composite scores do strongly suggest that the patient's more stable measures of verbal comprehension index score are in the high average range.  There is indication of significant deficits and weaknesses (greater than 30 points difference) between her verbal comprehension and her working memory.  Verbal Comprehension Subtests Summary  Subtest Raw Score Scaled Score Percentile Rank Reference Group Scaled Score SEM  Similarities 32 14 91 14 1.04  Vocabulary 52 14 91 16 0.67  Information 16 11 63 12 0.73  (Comprehension) 35 18 99.6 18 1.08    The patient's verbal comprehension index was a 116 and fell at the 86 percentile.  Individual subtest making up this index score show that her verbal reasoning, vocabulary and social judgment/comprehension were all in the high average to superior range.  Her social judgment and comprehension was in the 99th percentile.  The patient did have average performance with regard to her general fund of information.  However, this is not indicative of significant  deficits.  Perceptual Reasoning Subtests Summary  Subtest Raw Score Scaled Score Percentile Rank Reference Group Scaled Score SEM  Block Design 33 10 50 7 1.08  Matrix Reasoning 14 10 50 7 0.90  Visual Puzzles 10 9 37 7 0.85  (Figure Weights) 10 9 37 7 0.95  (Picture Completion) 16 14 91 12 1.16   The patient produced a perceptual reasoning index score of 98 which falls at the 45th percentile and is in the average range.  Individual subtest making up this measure show that she performed generally in the average range with regard to visual analysis and organization, visual reasoning and problem-solving, and visual estimating.  The patient produced high average scores with regard to her ability to identify visual anomalies from a gestalt.  Working Doctor, general practice Raw Score Scaled Score Percentile Rank Reference Group Scaled Score SEM  Digit Span 20 7 16 6  0.79  Arithmetic 11 8 25 8  0.99  (Letter-Number Seq.) 19 10 50 9 1.04  The patient produced a working memory index score of 86 which falls in the 18th percentile and in the low average range.  Individual items making up this index show that she had mild to moderate impairments with regard to auditory encoding abilities.  She did perform in the average range with more complex multi processing abilities.  However, the auditory encoding does appear to be indicative of impairments.  Processing Speed Subtests Summary  Subtest Raw Score Scaled Score Percentile Rank Reference Group Scaled Score SEM  Symbol Search 27 10 50 8 1.31  Coding 70 13 84 9 0.99  (Cancellation) 35 10 50 8 1.34   The patient produced a processing speed index score of 108 which falls at the 70th percentile and in the average range.  Individual subtest making up this measure show that she performed in the average range to higher into the average range for items assessing visual scanning, visual searching, and overall speed of mental operations.  The patient  was then administered the [place] for older adults.  For comparison sake we have used her global abilities index score as a predictor for where she should fall on various memory task functioning levels.  This is likely a very conservative estimation.  I do predict her premorbid full-scale IQ score to been between 110 and 120 and her global abilities index score currently falls at a 107.  ABILITY-MEMORY ANALYSIS  Ability Score:  GAI: 107 Date of Testing:  WAIS-IV 2017/07/23; WMS-IV 2017/07/23  Predicted Difference Method   Index Predicted WMS-IV Index Score Actual WMS-IV Index Score Difference Critical Value  Significant Difference Y/N Base Rate  Auditory Memory 104 107 -3 9.22 N   Visual Memory 104 84 20 8.25 Y 5-10%  Immediate Memory 105 102 3 10.13 N   Delayed Memory 104 96 8 11.43 N   Statistical significance (critical value) at the .01 level.   Using her global ability index score of 107 to make a predicted score for her performance on various memory indices show that her immediate memory currently is consistent with predicted levels but may be somewhat below premorbid functioning levels.  The patient's delayed memory was also generally consistent with premorbid functioning although it was 8 points below predicted levels.  The patient's auditory memory was a 107 and was equal to or higher than predicted levels of functioning based on her GAI.  However, there were significant deficits noted for visual memory.  The patient produced a 20 point difference between a conservative predicted level and actual achieved level.  This is clearly indicative of significant visual memory deficits.  This combined with mild deficits with regard to auditory encoding do suggest that there are deficits for initial encoding as well as significant deficits from visual memory.  Summary of Results:   Overall, the results of the current neuropsychological evaluation are consistent with 2  primary/focal areas of neuropsychological/cognitive deficits.  One is having to do with her auditory encoding/working memory in the other area of significant deficit has to do with visual memory.  The patient performs very well on measures that are considered to be hold test where her verbal comprehension index score was a 116 and at the 86 percentile.  Overall, these results do suggest that there are focal deficits rather than global neuropsychological deficits.  Impression/Diagnosis:   The results of the current objective neuropsychological assessment are not consistent with those typically seen with progressive dementia of either a cortical or subcortical dementia.  Condition such  as Alzheimer's, Lewy body, or other cortical dementia is do not appear to be consistent with the current findings.  The patient describes significant difficulties going on for several years now and she actually had her worse episodes around 2-1/2 years ago.  While the patient continues to have ongoing difficulties they are not worsening and her deficits over the past couple of years have been generally consistent with the exception of acute waxing and waning.  There are acute times where her expressive aphasia will become problematic but then improve.  The waxing and waning nature of her symptoms may be consistent with and indicative of the underlying etiology for her problems.  The patient has a very long history of severe migraines.  While the patient reports that she has stopped having the pain/nausea/photophobia phonophobia stages (Attack Phase) of a migraine, she may be continuing to have the prodromal and Aura phases of migraines.  This would be consistent with her waxing and waning of her expressive language abilities.  Repeated severe migraine episodes may be the culprit for her focal deficits with regard to visual memory deficits and attention and concentration deficits related to auditory encoding.  The patient does have a  premorbid history of dyslexia, which do complicate to some degree some of these visual assessments.  As far as recommendations, I do think that the patient should return to her neurologist that she had been seeing to treat her migraine headaches.  I have a strong suspicion that she is continuing with the prodromal and aura phase of vascular migraines and although she is not transitioning to later phases of a migraine, she is having regular migrainous events.  I do not think the patient has a progressive dementia but is having vascular events that are contributing to and causing her symptoms that she is experienced over the past 2 years.  I have provided feedback to the patient regarding the results of the current evaluation with my recommendations.  Diagnosis:    Axis I: Memory loss  Memory change  Migraine with aura and with status migrainosus, not intractable   Ilean Skill, Psy.D. Neuropsychologist   Wechsler Adult Intelligence Scale-Fourth Edition Score Report   Examinee Name Sarann Tregre  Date of Report 2017/07/31  Justice Rocher [id number]  Years of Education   Date of Birth 07-05-1950  Primary Language   Gender Female  Handedness   Race/Ethnicity   Examiner Name Ilean Skill  Date of Testing 2017/07/23  Age at Testing 61 years 3 months  Retest? No   Composite Score Summary  Scale Sum of Scaled Scores Composite Score Percentile Rank 95% Conf. Interval Qualitative Description  Verbal Comprehension 39 VCI 116 86 110-121 High Average  Perceptual Reasoning 29 PRI 98 45 92-104 Average  Working Memory 15 WMI 86 18 80-94 Low Average  Processing Speed 23 PSI 108 70 99-116 Average  Full Scale 106 FSIQ 104 61 100-108 Average  General Ability 68 GAI 107 68 102-112 Average   Note. The vertical bars represent the standard error of measurement (SEM). SEM values are based on the examinee's age.   ANALYSIS   Index Level Discrepancy Comparisons  Comparison Score 1 Score 2  Difference Critical Value .05 Significant Difference Y/N Base Rate by Overall Sample  VCI - PRI 116 98 18 8.31 Y 9.5  VCI - WMI 116 86 30 8.82 Y 1.5  VCI - PSI 116 108 8 10.19 N 31.5  PRI - WMI 98 86 12 9.74 Y 18.4  PRI - PSI  98 108 -10 11.00 N 26.1  WMI - PSI 86 108 -22 11.38 Y 7.4  FSIQ - GAI 104 107 -3 3.08 N 30.3  Base Rate by Overall Sample. Statistical significance (critical value) at the .05 level.  Verbal Comprehension Subtests Summary  Subtest Raw Score Scaled Score Percentile Rank Reference Group Scaled Score SEM  Similarities 32 14 91 14 1.04  Vocabulary 52 14 91 16 0.67  Information 16 11 63 12 0.73  (Comprehension) 35 18 99.6 18 1.08   Perceptual Reasoning Subtests Summary  Subtest Raw Score Scaled Score Percentile Rank Reference Group Scaled Score SEM  Block Design 33 10 50 7 1.08  Matrix Reasoning 14 10 50 7 0.90  Visual Puzzles 10 9 37 7 0.85  (Figure Weights) 10 9 37 7 0.95  (Picture Completion) 16 14 91 12 1.16   Working Doctor, general practice Raw Score Scaled Score Percentile Rank Reference Group Scaled Score SEM  Digit Span 20 7 16 6  0.79  Arithmetic 11 8 25 8  0.99  (Letter-Number Seq.) 19 10 50 9 1.04   Processing Speed Subtests Summary  Subtest Raw Score Scaled Score Percentile Rank Reference Group Scaled Score SEM  Symbol Search 27 10 50 8 1.31  Coding 70 13 84 9 0.99  (Cancellation) 35 10 50 8 1.34   Subtest Level Discrepancy Comparisons  Subtest Comparison Score 1 Score 2 Difference Critical Value .05 Significant Difference Y/N Base Rate  Digit Span - Arithmetic 7 8 -1 2.57 N 42.20  Symbol Search - Coding 10 13 -3 3.41 N 15.00  Statistical significance (critical value) at the .05 level.     DETERMINING STRENGTHS AND WEAKNESSES   Differences Between Subtest and Overall Mean of Subtest Scores  Subtest Subtest Scaled Score Mean Scaled Score Difference Critical Value .05 Strength or Weakness Base Rate  Block Design 10 10.60  -0.60 2.85  >25%  Similarities 14 10.60 3.40 2.82 S 5-10%  Digit Span 7 10.60 -3.60 2.22 W 5-10%  Matrix Reasoning 10 10.60 -0.60 2.54  >25%  Vocabulary 14 10.60 3.40 2.03 S 5-10%  Arithmetic 8 10.60 -2.60 2.73  15-25%  Symbol Search 10 10.60 -0.60 3.42  >25%  Visual Puzzles 9 10.60 -1.60 2.71  >25%  Information 11 10.60 0.40 2.19  >25%  Coding 13 10.60 2.40 2.97  >25%  Overall: Mean = 10.60, Scatter =  7, Base rate =  48.9 Base Rate for Intersubtest Scatter is reported for 10 Subtests. Statistical significance (critical value) at the .05 level.   PROCESS ANALYSIS   Perceptual Reasoning Process Score Summary  Process Score Raw Score Scaled Score Percentile Rank SEM  Block Design No Time Bonus 32 10 50 1.16    Working Memory Process Score Summary  Process Score Raw Score Scaled Score Percentile Rank Base Rate SEM  Digit Span Forward 6 5 5  -- 1.37  Digit Span Backward 7 9 37 -- 1.41  Digit Span Sequencing 7 9 37 -- 1.31  Longest Digit Span Forward 5 -- -- 95.5 --  Longest Digit Span Backward 4 -- -- 76.5 --  Longest Digit Span Sequence 5 -- -- 78.0 --  Longest Letter-Number Sequence 5 -- -- 68.0 --    Process Level Discrepancy Comparisons  Process Comparison Score 1 Score 2 Difference Critical Value .05 Significant Difference Y/N Base Rate  Block Design - Block Design No Time Bonus 10 10 0 3.08 N   Digit Span Forward - Digit Span Backward 5 9 -  4 3.65 Y 11.0  Digit Span Forward - Digit Span Sequencing 5 9 -4 3.60 Y 13.0  Digit Span Backward - Digit Span Sequencing 9 9 0 3.56 N   Longest Digit Span Forward - Longest Digit Span Backward 5 4 1  -- -- 89.5  Longest Digit Span Forward - Longest Digit Span Sequence 5 5 0 -- --   Longest Digit Span Backward - Longest Digit Span Sequence 4 5 -1 -- -- 66.5  Statistical significance (critical value) at the .05 level.   Raw Scores  Subtest Score Range Raw Score  Process Score Range Raw Score  Block Design 0-66 33   Block Design No Time Bonus 0-48 32  Similarities 0-36 32  Digit Span Forward 0-16 6  Digit Span 0-48 20  Digit Span Backward 0-16 7  Matrix Reasoning 0-26 14  Digit Span Sequencing 0-16 7  Vocabulary 0-57 52  Longest Digit Span Forward 0, 2-9 5  Arithmetic 0-22 11  Longest Digit Span Backward 0, 2-8 4  Symbol Search 0-60 27  Longest Digit Span Sequence 0, 2-9 5  Visual Puzzles 0-26 10  Longest Letter-Number Seq. 0, 2-8 5  Information 0-26 16      Coding 0-135 70      Letter-Number Seq. 0-30 19      Figure Weights 0-27 10      Comprehension 0-36 35      Cancellation 0-72 35      Picture Completion 0-24 16          Wechsler Memory Scale-Fourth Edition Score Report   Examinee Name Olyvia Gopal  Date of Report 2017/07/31  Kingsley Spittle ID [id number]  Years of Education 12  Date of Birth 16-Jan-1951  Home Language   Gender Female  Handedness Not Specified  Race/Ethnicity   Examiner Name Ilean Skill  Date of Testing 2017/07/23  Age at Testing 74 years 3 months  Retest? No   Comments: Brief Cognitive Status Exam Classification  Age Years of Education Raw Score Classification Level Base Rate  67 years 3 months 12 47 Borderline 9.5    Index Score Summary  Index Sum of Scaled Scores Index Score Percentile Rank 95% Confidence Interval Qualitative Descriptor  Auditory Memory (AMI) 45 107 68 100-113 Average  Visual Memory (VMI) 14 84 14 80-89 Low Average  Immediate Memory (IMI) 31 102 55 96-108 Average  Delayed Memory (DMI) 28 96 39 89-104 Average     Primary Subtest Scaled Score Summary  Subtest Domain Raw Score Scaled Score Percentile Rank  Logical Memory I AM 37 12 75  Logical Memory II AM 25 13 84  Verbal Paired Associates I AM 20 10 50  Verbal Paired Associates II AM 6 10 50  Visual Reproduction I VM 30 9 37  Visual Reproduction II VM 5 5 5    Primary Subtest Scaled Score Profile  PROCESS SCORE CONVERSIONS  Auditory Memory Process Score Summary  Process Score Raw  Score Scaled Score Percentile Rank Cumulative Percentage (Base Rate)  LM II Recognition 21 - - >75%  VPA II Recognition 29 - - 51-75%   Visual Memory Process Score Summary  Process Score Raw Score Scaled Score Percentile Rank Cumulative Percentage (Base Rate)  VR II Recognition 7 - - >75%    SUBTEST-LEVEL DIFFERENCES WITHIN INDEXES  Auditory Memory Index  Subtest Scaled Score AMI Mean Score Difference from Mean Critical Value Base Rate  Logical Memory I 12 11.25 0.75 2.45 >25%  Logical Memory II 13 11.25 1.75  2.38 25%  Verbal Paired Associates I 10 11.25 -1.25 2.04 >25%  Verbal Paired Associates II 10 11.25 -1.25 3.06 >25%  Statistical significance (critical value) at the .05 level.   Immediate Memory Index  Subtest Scaled Score IMI Mean Score Difference from Mean Critical Value Base Rate  Logical Memory I 12 10.33 1.67 2.01 >25%  Verbal Paired Associates I 10 10.33 -0.33 1.71 >25%  Visual Reproduction I 9 10.33 -1.33 1.71 >25%  Statistical significance (critical value) at the .05 level.   Delayed Memory Index  Subtest Scaled Score DMI Mean Score Difference from Mean Critical Value Base Rate  Logical Memory II 13 9.33 3.67 2.15 2-5%  Verbal Paired Associates II 10 9.33 0.67 2.63 >25%  Visual Reproduction II 5 9.33 -4.33 1.77 2-5%  Statistical significance (critical value) at the .05 level.    SUBTEST DISCREPANCY COMPARISON  Comparison Score 1 Score 2 Difference Critical Value Base Rate  Visual Reproduction I - Visual Reproduction II 9 5 4  1.99 17.2  Statistical significance (critical value) at the .05 level.    SUBTEST-LEVEL CONTRAST SCALED SCORES  Logical Memory  Score Score 1 Score 2 Contrast Scaled Score  LM II Recognition vs. Delayed Recall >75% 13 11  LM Immediate Recall vs. Delayed Recall 12 13 12    Verbal Paired Associates  Score Score 1 Score 2 Contrast Scaled Score  VPA II Recognition vs. Delayed Recall 51-75% 10 9  VPA Immediate Recall vs. Delayed  Recall 10 10 10    Visual Reproduction  Score Score 1 Score 2 Contrast Scaled Score  VR II Recognition vs. Delayed Recall >75% 5 3  VR Immediate Recall vs. Delayed Recall 9 5 5     INDEX-LEVEL CONTRAST SCALED SCORES  WMS-IV Indexes  Score Score 1 Score 2 Contrast Scaled Score  Auditory Memory Index vs. Visual Memory Index 107 84 6  Immediate Memory Index vs. Delayed Memory Index 102 96 8    RAW SCORES  Subtest Score Range Adult Score Range Older Adult Raw Score  Brief Cognitive Status Exam 0-58 0-58 47  Logical Memory I 0-50 0-53 37  Logical Memory II 0-50 0-39 25  Verbal Paired Associates I 0-56 0-40 20  Verbal Paired Associates II 0-14 0-10 6  CVLT-II Trials 1-5 5-95 5-95   CVLT-II Long-Delay -5-5 -5-5   Designs I 0-120    Designs II 0-120    Visual Reproduction I 0-43 0-43 30  Visual Reproduction II 0-43 0-43 5  Spatial Addition 0-24    Symbol Span 0-50 0-50   Process Score Range Adult Score Range Older Adult Raw Score  LM II Recognition 0-30 0-23 21  VPA II Recognition 0-40 0-30 29  VPA II Word Recall 0-28 0-20   DE I Content 0-48    DE I Spatial 0-24    DE II Content 0-48    DE II Spatial 0-24    DE II Recognition 0-24    VR II Recognition 0-7 0-7 7  VR II Copy 0-43 0-43     ABILITY-MEMORY ANALYSIS  Ability Score:  GAI: 107 Date of Testing:  WAIS-IV 2017/07/23; WMS-IV 2017/07/23  Predicted Difference Method   Index Predicted WMS-IV Index Score Actual WMS-IV Index Score Difference Critical Value  Significant Difference Y/N Base Rate  Auditory Memory 104 107 -3 9.22 N   Visual Memory 104 84 20 8.25 Y 5-10%  Immediate Memory 105 102 3 10.13 N   Delayed Memory 104 96 8 11.43 N   Statistical significance (critical  value) at the .01 level.   Contrast Scaled Scores  Score Score 1 Score 2 Contrast Scaled Score  General Ability Index vs. Auditory Memory Index 107 107 11  General Ability Index vs. Visual Memory Index 107 84 5  General Ability Index  vs. Immediate Memory Index 107 102 9  General Ability Index vs. Delayed Memory Index 107 96 8  Verbal Comprehension Index vs. Auditory Memory Index 116 107 10  Perceptual Reasoning Index vs. Visual Memory Index 98 84 6  Working Memory Index vs. Auditory Memory Index 86 107 13    End of Report

## 2017-08-04 ENCOUNTER — Ambulatory Visit (HOSPITAL_BASED_OUTPATIENT_CLINIC_OR_DEPARTMENT_OTHER)
Admission: RE | Admit: 2017-08-04 | Discharge: 2017-08-04 | Disposition: A | Discharge status: Home / Self Care | Payer: Medicare HMO | Source: Ambulatory Visit | Attending: Cardiology | Admitting: Cardiology

## 2017-08-04 DIAGNOSIS — R0609 Other forms of dyspnea: Secondary | ICD-10-CM

## 2017-08-04 NOTE — Progress Notes (Signed)
  Echocardiogram 2D Echocardiogram has been performed.  Joelene Millin 08/04/2017, 10:59 AM

## 2017-08-06 ENCOUNTER — Encounter (HOSPITAL_BASED_OUTPATIENT_CLINIC_OR_DEPARTMENT_OTHER): Payer: Medicare HMO | Admitting: Psychology

## 2017-08-06 ENCOUNTER — Encounter: Payer: Self-pay | Admitting: Psychology

## 2017-08-06 DIAGNOSIS — G43101 Migraine with aura, not intractable, with status migrainosus: Secondary | ICD-10-CM | POA: Diagnosis not present

## 2017-08-06 DIAGNOSIS — R413 Other amnesia: Secondary | ICD-10-CM

## 2017-08-06 NOTE — Progress Notes (Signed)
Today I provided feedback regarding the results of the recent neuropsychological evaluation.  It also allowed me to address some of the issues that have been ongoing with the patient.  I have made recommendations that she return to see her neurologist to have been treating her migraine headaches in the past.  I do think that she is likely still having prodromal and oral phases of her migraine both potentially causing localized/focal injury/deficits as well as accounting for some of the waxing and waning to her expressive language deficits.  Below is the summary and recommendations from the formal neuropsychological evaluation that can be found in his complete form for the July 30, 2017 visit.  Impression/Diagnosis:                     The results of the current objective neuropsychological assessment are not consistent with those typically seen with progressive dementia of either a cortical or subcortical dementia.  Condition such as Alzheimer's, Lewy body, or other cortical dementia is do not appear to be consistent with the current findings.  The patient describes significant difficulties going on for several years now and she actually had her worse episodes around 2-1/2 years ago.  While the patient continues to have ongoing difficulties they are not worsening and her deficits over the past couple of years have been generally consistent with the exception of acute waxing and waning.  There are acute times where her expressive aphasia will become problematic but then improve.  The waxing and waning nature of her symptoms may be consistent with and indicative of the underlying etiology for her problems.  The patient has a very long history of severe migraines.  While the patient reports that she has stopped having the pain/nausea/photophobia phonophobia stages (Attack Phase) of a migraine, she may be continuing to have the prodromal and Aura phases of migraines.  This would be consistent with her waxing and  waning of her expressive language abilities.  Repeated severe migraine episodes may be the culprit for her focal deficits with regard to visual memory deficits and attention and concentration deficits related to auditory encoding.  The patient does have a premorbid history of dyslexia, which do complicate to some degree some of these visual assessments.  As far as recommendations, I do think that the patient should return to her neurologist that she had been seeing to treat her migraine headaches.  I have a strong suspicion that she is continuing with the prodromal and aura phase of vascular migraines and although she is not transitioning to later phases of a migraine, she is having regular migrainous events.  I do not think the patient has a progressive dementia but is having vascular events that are contributing to and causing her symptoms that she is experienced over the past 2 years.  I have provided feedback to the patient regarding the results of the current evaluation with my recommendations.  Diagnosis:                               Axis I: Memory loss  Memory change  Migraine with aura and with status migrainosus, not intractable   Ilean Skill, Psy.D. Neuropsychologist

## 2017-08-12 IMAGING — CT CT HEAD W/O CM
1 series · 16 of 30 positions shown, 20 images · non-contrast
Comparison: None.

CLINICAL DATA: Right temporal headache for 2 days.  Fever.

EXAM:
CT HEAD WITHOUT CONTRAST
TECHNIQUE: Contiguous axial images were obtained from the base of the skull
through the vertex without intravenous contrast.

[Series 2: head wo · axial · 0.46mm/px · z∈[-148,-18]mm · 16 of 30 slices shown, 20 images]
[im 2/30  brain]
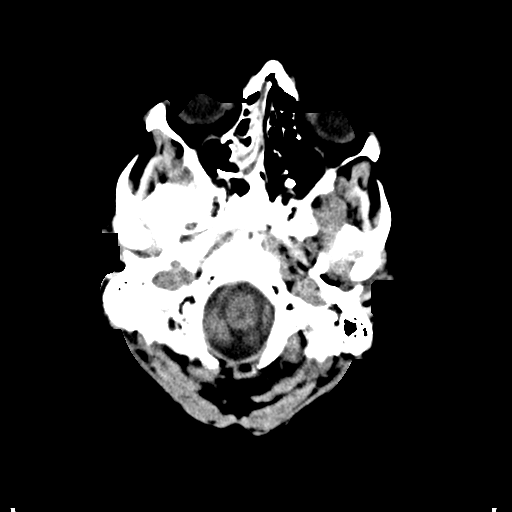
[im 2/30  bone]
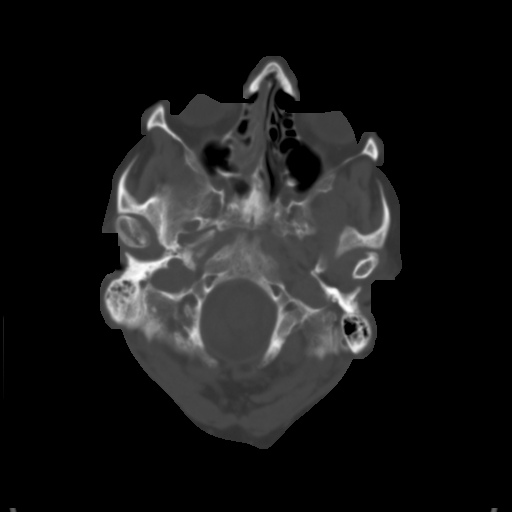
[im 4/30  brain]
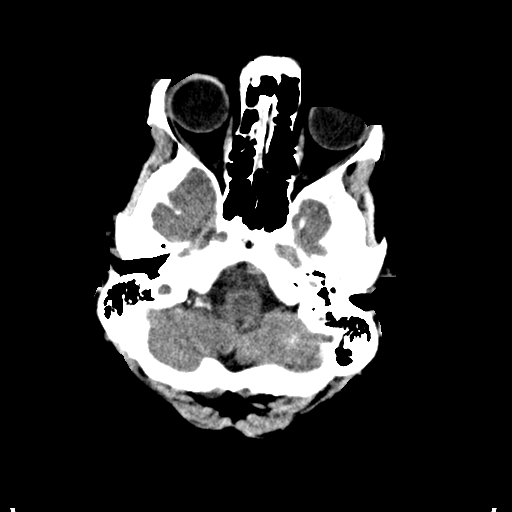
[im 6/30  brain]
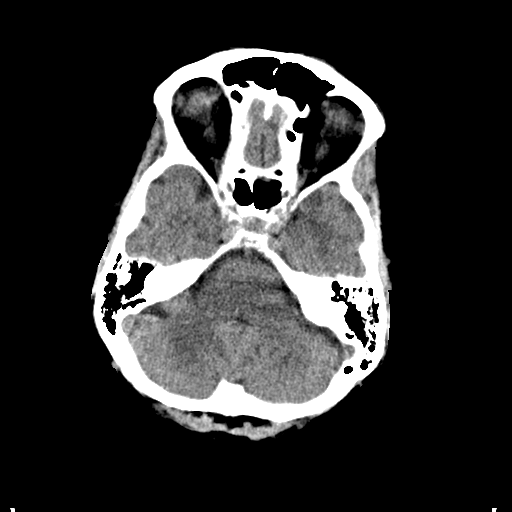
[im 8/30  brain]
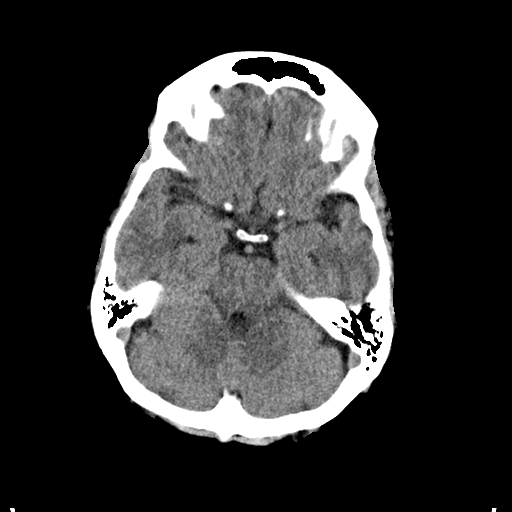
[im 9/30  brain]
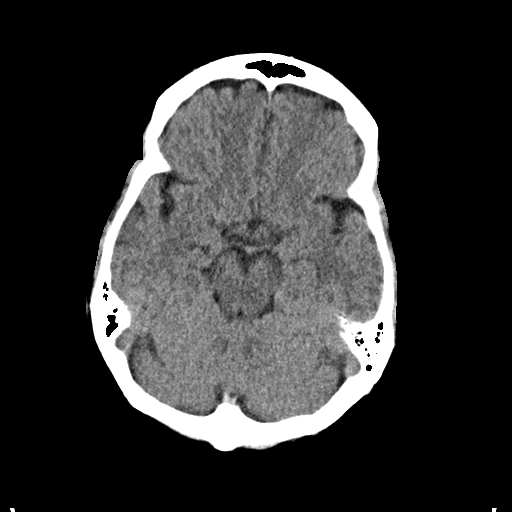
[im 9/30  bone]
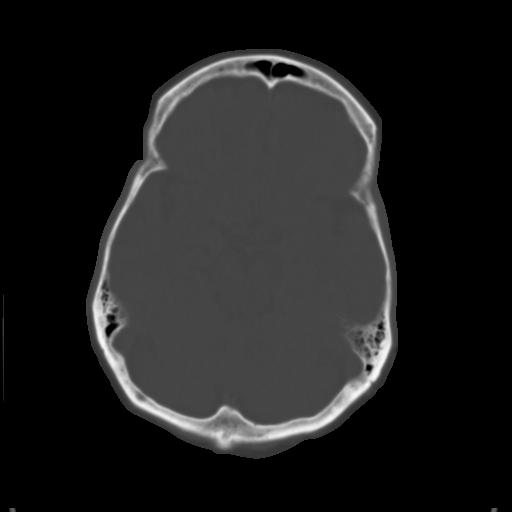
[im 11/30  brain]
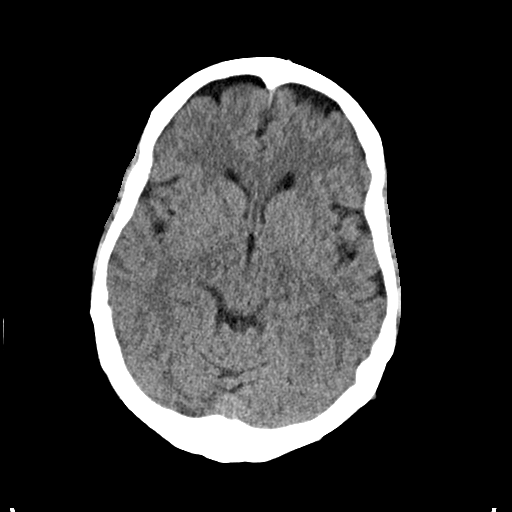
[im 13/30  brain]
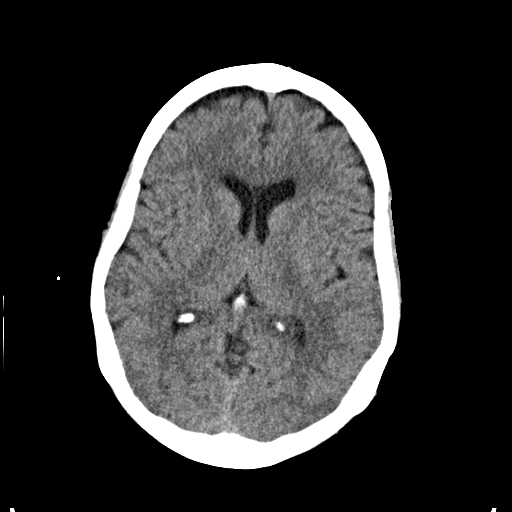
[im 15/30  brain]
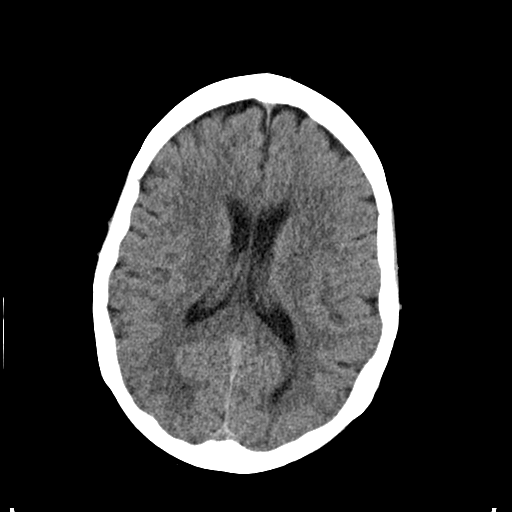
[im 16/30  brain]
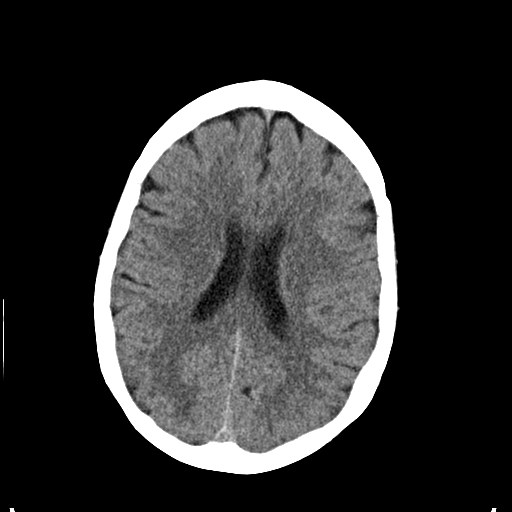
[im 16/30  bone]
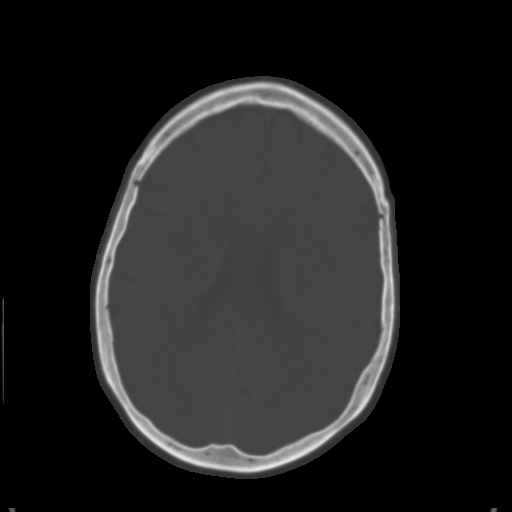
[im 18/30  brain]
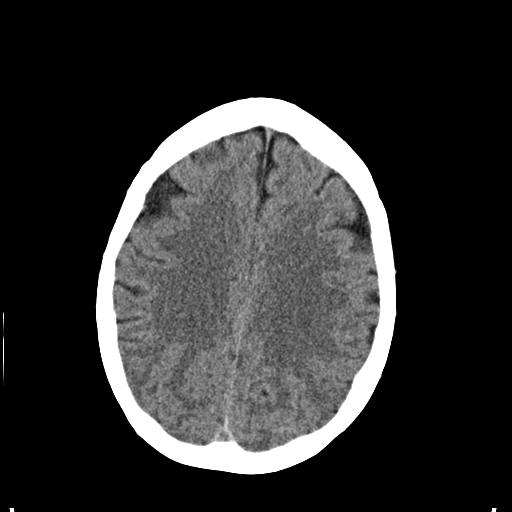
[im 20/30  brain]
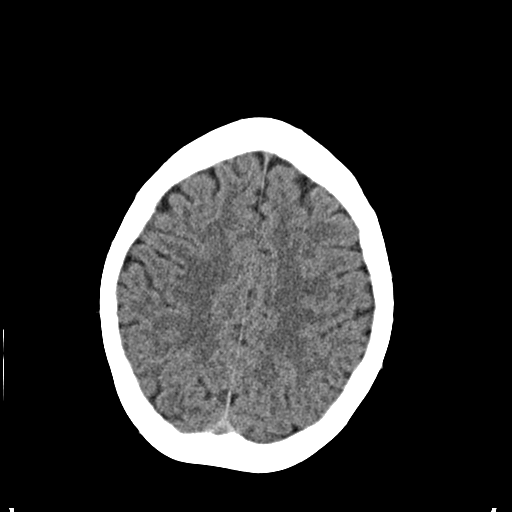
[im 22/30  brain]
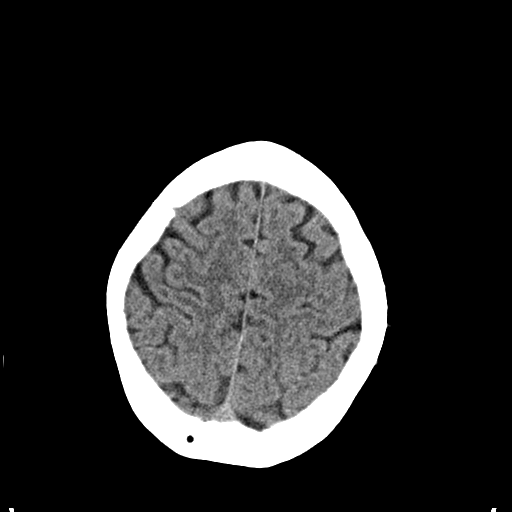
[im 23/30  brain]
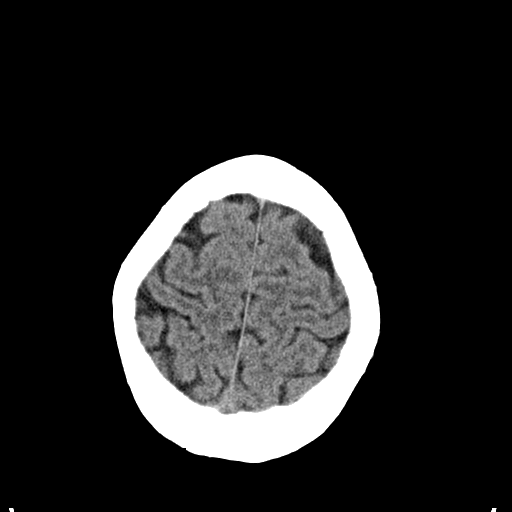
[im 23/30  bone]
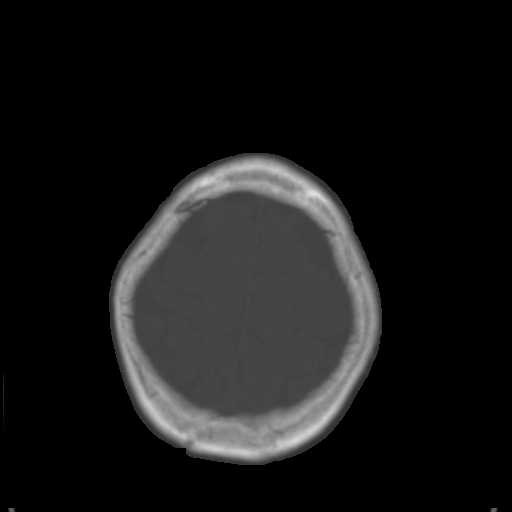
[im 25/30  brain]
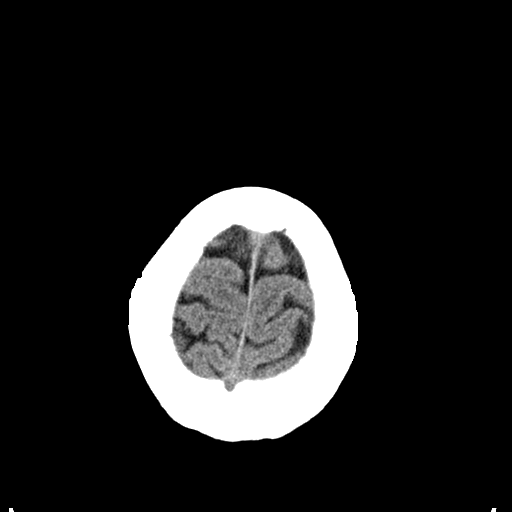
[im 27/30  brain]
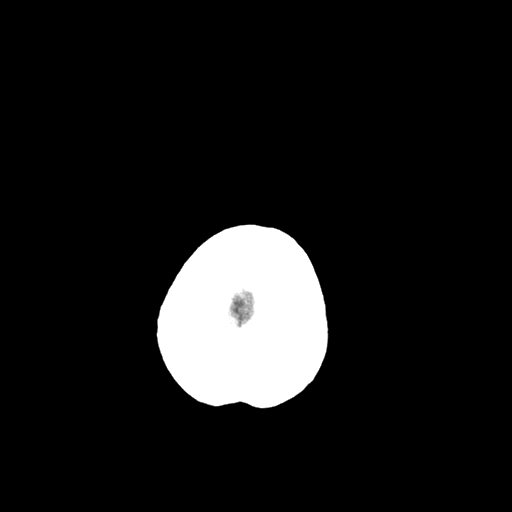
[im 29/30  brain]
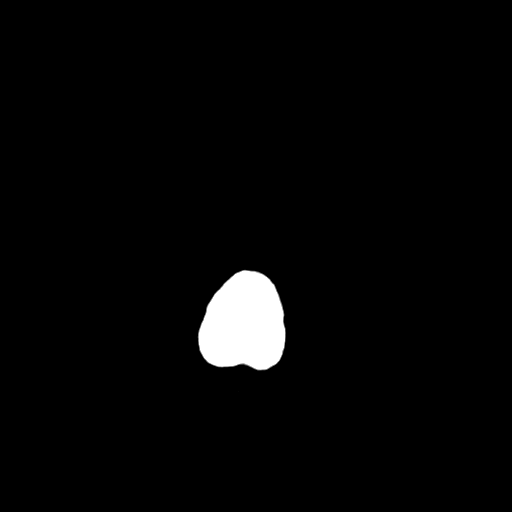

[16 of 30 positions shown; findings below may reference images not displayed]

FINDINGS: No intracranial hemorrhage, mass effect, or midline shift. No
hydrocephalus. The basilar cisterns are patent. No evidence of
territorial infarct. No intracranial fluid collection. Calvarium is
intact. Mucosal thickening of right ethmoid air cells, remaining
paranasal sinuses are clear. The mastoid air cells are well aerated.
IMPRESSION: No acute intracranial abnormality.

## 2017-08-13 ENCOUNTER — Ambulatory Visit: Payer: Medicare HMO | Admitting: Cardiology

## 2017-08-13 ENCOUNTER — Encounter: Payer: Self-pay | Admitting: Cardiology

## 2017-08-13 VITALS — BP 118/72 | HR 81 | Ht 70.0 in | Wt 158.4 lb

## 2017-08-13 DIAGNOSIS — R0609 Other forms of dyspnea: Secondary | ICD-10-CM

## 2017-08-13 DIAGNOSIS — G43101 Migraine with aura, not intractable, with status migrainosus: Secondary | ICD-10-CM

## 2017-08-13 DIAGNOSIS — C9000 Multiple myeloma not having achieved remission: Secondary | ICD-10-CM | POA: Diagnosis not present

## 2017-08-13 DIAGNOSIS — I951 Orthostatic hypotension: Secondary | ICD-10-CM | POA: Diagnosis not present

## 2017-08-13 NOTE — Progress Notes (Signed)
Cardiology Office Note:    Date:  08/13/2017   ID:  Nancy Parks, DOB Sep 08, 1950, MRN [id number]  PCP:  Hali Marry, MD  Cardiologist:  Jenne Campus, MD    Referring MD: Hali Marry, *   No chief complaint on file. Better  History of Present Illness:    Nancy Parks is a 68 y.o. female with orthostatic hypotension.  We met last time we talked about this problem.  I suspect she does have some dysautonomia.  She drinks plenty of fluid with some salt which helps her still describe to have some dizziness when she gets up very quickly but she is able to function today she comes to my office to discuss this issue however she started having aura of her migraine.  We had to finish visit rather quickly so she can go home and take her Imitrex otherwise it will not work.  We talked about measures that she can take to prevent episodes of orthostatic hypotension she is already drinking plenty of fluids liberated salt intake.  We started talking about potentially doing Midrin on Northera.  She is rather unkind towards this approach and assure her feelings if he can control the situation with fluids salt and eventually in the future may be elastic stockings I think that would be the way to go.  She did have echocardiogram done which was normal.  Past Medical History:  Diagnosis Date  . Depression   . Headache(784.0)    Migraines  . Leukopenia   . Leukopenia 07/13/2012  . Multiple myeloma, without mention of having achieved remission 07/13/2012    Past Surgical History:  Procedure Laterality Date  . BASAL CELL CARCINOMA EXCISION  2013   removed from nose  . CATARACT EXTRACTION  09/2007  . LAPAROSCOPIC ENDOMETRIOSIS FULGURATION  1979    Current Medications: Current Meds  Medication Sig  . AMBULATORY NON FORMULARY MEDICATION Take 750 mg by mouth 2 (two) times daily. Cura med-Curcumin  . AMBULATORY NON FORMULARY MEDICATION Medication Name: digestive enzymes  . AMBULATORY NON  FORMULARY MEDICATION Medication Name: 5 herbal energy- after meals  . AMBULATORY NON FORMULARY MEDICATION Medication Name: Blood Pressure monitor FAX: [phone]  . CALCIUM & MAGNESIUM CARBONATES PO Take 4 capsules by mouth daily.   . Cholecalciferol 5000 units capsule Take 5,000 Units by mouth daily.  . nortriptyline (PAMELOR) 25 MG capsule TAKE ONE CAPSULE BY MOUTH EVERY NIGHT AT BEDTIME  . OVER THE COUNTER MEDICATION Take 500 mg by mouth daily. Triphala  . rizatriptan (MAXALT) 10 MG tablet Take 10 mg by mouth Nightly.  . SUMAtriptan (IMITREX) 100 MG tablet Take 100 mg by mouth as needed for migraine.     Allergies:   Latex; Other; Tape; and Fosamax [alendronate sodium]   Social History   Socioeconomic History  . Marital status: Divorced    Spouse name: Not on file  . Number of children: Not on file  . Years of education: Not on file  . Highest education level: Not on file  Occupational History  . Occupation: Aeronautical engineer: [employer]  . Occupation: Economist  Social Needs  . Financial resource strain: Not on file  . Food insecurity:    Worry: Not on file    Inability: Not on file  . Transportation needs:    Medical: Not on file    Non-medical: Not on file  Tobacco Use  . Smoking status: Never Smoker  . Smokeless tobacco: Never Used  . Tobacco  comment: never used tobacco  Substance and Sexual Activity  . Alcohol use: No    Alcohol/week: 0.0 oz  . Drug use: No  . Sexual activity: Yes    Partners: Male  Lifestyle  . Physical activity:    Days per week: Not on file    Minutes per session: Not on file  . Stress: Not on file  Relationships  . Social connections:    Talks on phone: Not on file    Gets together: Not on file    Attends religious service: Not on file    Active member of club or organization: Not on file    Attends meetings of clubs or organizations: Not on file    Relationship status: Not on file  Other Topics Concern  . Not on file    Social History Narrative   No exercise.  Doing 10 min of yoga.      Family History: The patient's family history includes Breast cancer in her unknown relative; Cancer in her sister; Colon cancer in her father; Hyperlipidemia in her sister; Hypertension in her sister; Melanoma in her sister. ROS:   Please see the history of present illness.    All 14 point review of systems negative except as described per history of present illness  EKGs/Labs/Other Studies Reviewed:      Recent Labs: 04/27/2017: Magnesium 2.3 06/01/2017: TSH 1.70 07/06/2017: ALT 22; BUN 10; Creatinine 1.02; Hemoglobin 13.3; Platelet Count 265; Potassium 4.8; Sodium 139  Recent Lipid Panel    Component Value Date/Time   CHOL 187 05/23/2016 0918   TRIG 105 05/23/2016 0918   HDL 47 (L) 05/23/2016 0918   CHOLHDL 4.0 05/23/2016 0918   VLDL 26 10/01/2015 1037   LDLCALC 124 10/01/2015 1037    Physical Exam:    VS:  BP 118/72 (BP Location: Right Arm, Patient Position: Sitting, Cuff Size: Normal)   Pulse 81   Ht _0  (1.778 m)   Wt 158 lb 6.4 oz (71.8 kg)   SpO2 99%   BMI 22.73 kg/m     Wt Readings from Last 3 Encounters:  08/13/17 158 lb 6.4 oz (71.8 kg)  07/15/17 158 lb (71.7 kg)  07/06/17 157 lb (71.2 kg)     GEN:  Well nourished, well developed in no acute distress HEENT: Normal NECK: No JVD; No carotid bruits LYMPHATICS: No lymphadenopathy CARDIAC: RRR, no murmurs, no rubs, no gallops RESPIRATORY:  Clear to auscultation without rales, wheezing or rhonchi  ABDOMEN: Soft, non-tender, non-distended MUSCULOSKELETAL:  No edema; No deformity  SKIN: Warm and dry LOWER EXTREMITIES: no swelling NEUROLOGIC:  Alert and oriented x 3 PSYCHIATRIC:  Normal affect   ASSESSMENT:    1. Orthostatic hypotension   2. Migraine with aura and with status migrainosus, not intractable   3. Dyspnea on exertion   4. Multiple myeloma, remission status unspecified (HCC)    PLAN:    In order of problems listed  above:  1. Orthostatic hypotension: Plan as outlined above 2. Migraine with aura.  She started having migraine today she went home quickly to takes her Imitrex 3. Dyspnea on exertion.  Echocardiogram showed preserved left ventricular ejection fraction I encouraged her to exercise on the regular basis 4. Multiple myeloma doing well from that point review followed by internal medicine team and oncology.  Back in my office 3 months sooner if she has a problem   Medication Adjustments/Labs and Tests Ordered: Current medicines are reviewed at length with the patient today.  Concerns regarding medicines are outlined above.  No orders of the defined types were placed in this encounter.  Medication changes: No orders of the defined types were placed in this encounter.   Signed, Park Liter, MD, [facility] 08/13/2017 9:58 AM    [place]

## 2017-08-13 NOTE — Patient Instructions (Signed)
Medication Instructions:  Your physician recommends that you continue on your current medications as directed. Please refer to the Current Medication list given to you today.   Labwork: None  Testing/Procedures: None  Follow-Up: Your physician wants you to follow-up in: 3 months. You will receive a reminder letter in the mail two months in advance. If you don't receive a letter, please call our office to schedule the follow-up appointment.   If you need a refill on your cardiac medications before your next appointment, please call your pharmacy.   Thank you for choosing [facility]! Robyne Peers, RN [phone]

## 2017-08-14 ENCOUNTER — Ambulatory Visit: Payer: Self-pay | Admitting: Cardiology

## 2017-08-17 ENCOUNTER — Telehealth: Payer: Self-pay | Admitting: *Deleted

## 2017-08-17 ENCOUNTER — Encounter: Payer: Self-pay | Admitting: Neurology

## 2017-08-17 DIAGNOSIS — G43809 Other migraine, not intractable, without status migrainosus: Secondary | ICD-10-CM

## 2017-08-17 NOTE — Telephone Encounter (Signed)
Okay, no problem.  Referral entered.  If she needs me to fill any of her medications like her Maxalt until she is able to get and then please just let me know.

## 2017-08-17 NOTE — Telephone Encounter (Signed)
Called and advised patient that referral was placed and if needs refills have pharmacy send them to [name]. KG LPN

## 2017-08-17 NOTE — Telephone Encounter (Signed)
Pt called and stated that she will need a referral for a neurologist that specializes in headaches and migraines.  She stated that her neurologist is moving to [facility].   she does not have a preference. She is needing to get scheduled  ASAP!  Will fwd to pcp for advice.Nancy Parks, [name]

## 2017-08-19 ENCOUNTER — Encounter: Payer: Self-pay | Admitting: Endocrinology

## 2017-08-19 ENCOUNTER — Ambulatory Visit (INDEPENDENT_AMBULATORY_CARE_PROVIDER_SITE_OTHER): Payer: Medicare HMO | Admitting: Endocrinology

## 2017-08-19 VITALS — Ht 70.0 in | Wt 160.0 lb

## 2017-08-19 DIAGNOSIS — I951 Orthostatic hypotension: Secondary | ICD-10-CM | POA: Diagnosis not present

## 2017-08-19 MED ORDER — COSYNTROPIN NICU IV SYRINGE 0.25 MG/ML (STANDARD DOSE)
0.2500 mg | Freq: Once | INTRAVENOUS | Status: AC
  Administered 2017-08-19: 0.25 mg via INTRAVENOUS

## 2017-08-19 NOTE — Patient Instructions (Addendum)
blood tests are requested for you today.  We'll let you know about the results.  

## 2017-08-19 NOTE — Progress Notes (Signed)
Subjective:    Patient ID: Nancy Parks, female    DOB: Apr 13, 1950, 67 y.o.   MRN: [id number]  HPI Pt is referred by Dr Madilyn Fireman, for orthostatic hypotension.  no h/o abdominal or brain injury.  No h/o thyroid problems, seizures, hypoglycemia, amyloidosis, tuberculosis, or diabetes.  She has not recently been on steroid therapy.  No h/o ketoconazole, rifampin, or dilantin.  She has 9 mos of moderate dizziness sensation, and assoc fatigue.  sxs are worst in the afternoon.   Past Medical History:  Diagnosis Date  . Depression   . Headache(784.0)    Migraines  . Leukopenia   . Leukopenia 07/13/2012  . Multiple myeloma, without mention of having achieved remission 07/13/2012    Past Surgical History:  Procedure Laterality Date  . BASAL CELL CARCINOMA EXCISION  2013   removed from nose  . CATARACT EXTRACTION  09/2007  . LAPAROSCOPIC ENDOMETRIOSIS FULGURATION  1979    Social History   Socioeconomic History  . Marital status: Divorced    Spouse name: Not on file  . Number of children: Not on file  . Years of education: Not on file  . Highest education level: Not on file  Occupational History  . Occupation: Aeronautical engineer: [employer]  . Occupation: Economist  Social Needs  . Financial resource strain: Not on file  . Food insecurity:    Worry: Not on file    Inability: Not on file  . Transportation needs:    Medical: Not on file    Non-medical: Not on file  Tobacco Use  . Smoking status: Never Smoker  . Smokeless tobacco: Never Used  . Tobacco comment: never used tobacco  Substance and Sexual Activity  . Alcohol use: No    Alcohol/week: 0.0 oz  . Drug use: No  . Sexual activity: Yes    Partners: Male  Lifestyle  . Physical activity:    Days per week: Not on file    Minutes per session: Not on file  . Stress: Not on file  Relationships  . Social connections:    Talks on phone: Not on file    Gets together: Not on file    Attends religious service: Not on  file    Active member of club or organization: Not on file    Attends meetings of clubs or organizations: Not on file    Relationship status: Not on file  . Intimate partner violence:    Fear of current or ex partner: Not on file    Emotionally abused: Not on file    Physically abused: Not on file    Forced sexual activity: Not on file  Other Topics Concern  . Not on file  Social History Narrative   No exercise.  Doing 10 min of yoga.     Current Outpatient Medications on File Prior to Visit  Medication Sig Dispense Refill  . AMBULATORY NON FORMULARY MEDICATION Take 750 mg by mouth 2 (two) times daily. Cura med-Curcumin    . AMBULATORY NON FORMULARY MEDICATION Medication Name: digestive enzymes    . AMBULATORY NON FORMULARY MEDICATION Medication Name: 5 herbal energy- after meals    . AMBULATORY NON FORMULARY MEDICATION Medication Name: Blood Pressure monitor FAX: [phone] 1 each 0  . CALCIUM & MAGNESIUM CARBONATES PO Take 4 capsules by mouth daily.     . Cholecalciferol 5000 units capsule Take 5,000 Units by mouth daily.    . nortriptyline (PAMELOR) 25 MG capsule TAKE ONE  CAPSULE BY MOUTH EVERY NIGHT AT BEDTIME 90 capsule 2  . OVER THE COUNTER MEDICATION Take 500 mg by mouth daily. Triphala    . rizatriptan (MAXALT) 10 MG tablet Take 10 mg by mouth Nightly.    . SUMAtriptan (IMITREX) 100 MG tablet Take 100 mg by mouth as needed for migraine.     No current facility-administered medications on file prior to visit.     Allergies  Allergen Reactions  . Latex Rash    REACTION: Rash  REACTION: Rash  . Other Rash  . Tape Rash  . Fosamax [Alendronate Sodium] Other (See Comments)    GI upset    Family History  Problem Relation Age of Onset  . Breast cancer Unknown        grandmother   . Colon cancer Father        colon adenoma  . Hyperlipidemia Sister   . Hypertension Sister   . Cancer Sister        throat, smoker  . Melanoma Sister     Ht 5' 10"  (1.778 m)   Wt  160 lb (72.6 kg)   SpO2 98%   BMI 22.96 kg/m    Review of Systems Denies fever, sob, palpitations, blurry vision, n/v, abd pain, seizure, muscle weakness, easy bruising, cold intolerance, anxiety, diarrhea, LOC, vitiligo, excessive sweating, or change in skin tone.  She has intermitt headache and memory loss.  She has lost 20 lbs x 6 mos, due to her efforts.    Objective:   Physical Exam VS: see vs page GEN: no distress HEAD: head: no deformity eyes: no periorbital swelling, no proptosis external nose and ears are normal mouth: no lesion seen NECK: supple, thyroid is not enlarged CHEST WALL: no deformity LUNGS: clear to auscultation CV: reg rate and rhythm, no murmur ABD: abdomen is soft, nontender.  no hepatosplenomegaly.  not distended.  no hernia MUSCULOSKELETAL: muscle bulk and strength are grossly normal.  no obvious joint swelling.  gait is normal and steady EXTEMITIES: no deformity.  no ulcer on the feet.  feet are of normal color and temp.  no edema PULSES: dorsalis pedis intact bilat.  no carotid bruit NEURO:  cn 2-12 grossly intact.   readily moves all 4's.  sensation is intact to touch on the feet SKIN:  Normal texture and temperature.  No rash or suspicious lesion is visible.  Not diaphoretic.  No vitiligo.   NODES:  None palpable at the neck PSYCH: alert, well-oriented.  Does not appear anxious nor depressed.  ACTH stimulation test is done: baseline cortisol level=4 then Cosyntropin 250 mcg is given im 45 minutes later, cortisol level=22 (normal response)  I have reviewed outside records, and summarized: Pt was noted to have orthostatic hypotension, and referred here.  cardiol w/u was neg.       Assessment & Plan:  Orthostatic hypotension, new to me.  No evidence of pheochromocytoma, so adrenal etiol is excluded.    Patient Instructions  blood tests are requested for you today.  We'll let you know about the results.

## 2017-08-21 LAB — CORTISOL
Cortisol, Plasma: 21.5 ug/dL
Cortisol, Plasma: 3.6 ug/dL

## 2017-08-24 ENCOUNTER — Encounter: Payer: Self-pay | Admitting: Cardiology

## 2017-09-15 ENCOUNTER — Encounter: Payer: Self-pay | Admitting: Neurology

## 2017-10-02 ENCOUNTER — Encounter: Payer: Self-pay | Admitting: Family Medicine

## 2017-10-02 ENCOUNTER — Ambulatory Visit (INDEPENDENT_AMBULATORY_CARE_PROVIDER_SITE_OTHER): Payer: Medicare HMO | Admitting: Family Medicine

## 2017-10-02 VITALS — BP 102/47 | HR 76 | Ht 70.0 in | Wt 165.0 lb

## 2017-10-02 DIAGNOSIS — I951 Orthostatic hypotension: Secondary | ICD-10-CM

## 2017-10-02 DIAGNOSIS — R5383 Other fatigue: Secondary | ICD-10-CM | POA: Diagnosis not present

## 2017-10-02 DIAGNOSIS — R413 Other amnesia: Secondary | ICD-10-CM | POA: Diagnosis not present

## 2017-10-02 DIAGNOSIS — G43809 Other migraine, not intractable, without status migrainosus: Secondary | ICD-10-CM

## 2017-10-02 NOTE — Patient Instructions (Signed)
Schedule a Physical and labs.

## 2017-10-02 NOTE — Progress Notes (Signed)
Subjective:    Patient ID: Nancy Parks, female    DOB: 08/28/1950, 67 y.o.   MRN: [id number]  HPI 67 yo female comes in today to f/u on orthostatic hypotension-she was having lightheadedness that was worse in the afternoons.  She did see cardiology and they felt like it was consistent with orthostatic hypotension and gave her some direction on how to improve her symptoms.  She also then saw neurology who was also done a work-up and ruled out pheochromocytoma and other endocrine causes.   She is still having migraines daily.  She said for the last couple of months she is been having headaches every day and they are each lasting about 24 hours.  She went through one round of Maxalt and then went through a round of Imitrex and then they decided to go ahead and put her on propranolol 40 mg at bedtime.  She is actually been on that for about 2 weeks now and says that her headaches have gone from an intensity of about 10 to about 5 and says her dizziness is actually improved.  She has been keeping a good eye on her blood pressure which is dropped just slightly on the medication but not significantly.  She will struggling significantly with excess fatigue.  She did go see the neuropsychiatrist as well.  She scores above average on intelligence but really is having some difficulties with memory.  He did not feel like it was consistent with with premature Alzheimer's.  But he did discuss with her that he thinks that she may have been having some type of mini stroke type events that were not related to blood clots.  She has not had any type of brain imaging yet.  Review of Systems  BP (!) 102/47   Pulse 76   Ht 5' 10"  (1.778 m)   Wt 165 lb (74.8 kg)   SpO2 100%   BMI 23.68 kg/m     Allergies  Allergen Reactions  . Latex Rash    REACTION: Rash  REACTION: Rash  . Other Rash  . Tape Rash  . Fosamax [Alendronate Sodium] Other (See Comments)    GI upset    Past Medical History:  Diagnosis Date   . Depression   . Headache(784.0)    Migraines  . Leukopenia   . Leukopenia 07/13/2012  . Multiple myeloma, without mention of having achieved remission 07/13/2012    Past Surgical History:  Procedure Laterality Date  . BASAL CELL CARCINOMA EXCISION  2013   removed from nose  . CATARACT EXTRACTION  09/2007  . LAPAROSCOPIC ENDOMETRIOSIS FULGURATION  1979    Social History   Socioeconomic History  . Marital status: Divorced    Spouse name: Not on file  . Number of children: Not on file  . Years of education: Not on file  . Highest education level: Not on file  Occupational History  . Occupation: Aeronautical engineer: [employer]  . Occupation: Economist  Social Needs  . Financial resource strain: Not on file  . Food insecurity:    Worry: Not on file    Inability: Not on file  . Transportation needs:    Medical: Not on file    Non-medical: Not on file  Tobacco Use  . Smoking status: Never Smoker  . Smokeless tobacco: Never Used  . Tobacco comment: never used tobacco  Substance and Sexual Activity  . Alcohol use: No    Alcohol/week: 0.0 standard drinks  .  Drug use: No  . Sexual activity: Yes    Partners: Male  Lifestyle  . Physical activity:    Days per week: Not on file    Minutes per session: Not on file  . Stress: Not on file  Relationships  . Social connections:    Talks on phone: Not on file    Gets together: Not on file    Attends religious service: Not on file    Active member of club or organization: Not on file    Attends meetings of clubs or organizations: Not on file    Relationship status: Not on file  . Intimate partner violence:    Fear of current or ex partner: Not on file    Emotionally abused: Not on file    Physically abused: Not on file    Forced sexual activity: Not on file  Other Topics Concern  . Not on file  Social History Narrative   No exercise.  Doing 10 min of yoga.     Family History  Problem Relation Age of Onset  .  Breast cancer Unknown        grandmother   . Colon cancer Father        colon adenoma  . Hyperlipidemia Sister   . Hypertension Sister   . Cancer Sister        throat, smoker  . Melanoma Sister     Outpatient Encounter Medications as of 10/02/2017  Medication Sig  . AMBULATORY NON FORMULARY MEDICATION Take 750 mg by mouth 2 (two) times daily. Cura med-Curcumin  . AMBULATORY NON FORMULARY MEDICATION Medication Name: digestive enzymes  . AMBULATORY NON FORMULARY MEDICATION Medication Name: 5 herbal energy- after meals  . AMBULATORY NON FORMULARY MEDICATION Medication Name: Blood Pressure monitor FAX: [phone]  . CALCIUM & MAGNESIUM CARBONATES PO Take 4 capsules by mouth daily.   . Cholecalciferol 5000 units capsule Take 5,000 Units by mouth daily.  . nortriptyline (PAMELOR) 25 MG capsule TAKE ONE CAPSULE BY MOUTH EVERY NIGHT AT BEDTIME  . OVER THE COUNTER MEDICATION Take 500 mg by mouth daily. Triphala  . propranolol (INDERAL) 40 MG tablet Take 40 mg by mouth daily.  . rizatriptan (MAXALT) 10 MG tablet Take 10 mg by mouth Nightly.  . SUMAtriptan (IMITREX) 100 MG tablet Take 100 mg by mouth as needed for migraine.   No facility-administered encounter medications on file as of 10/02/2017.          Objective:   Physical Exam  Constitutional: She is oriented to person, place, and time. She appears well-developed and well-nourished.  HENT:  Head: Normocephalic and atraumatic.  Eyes: Conjunctivae and EOM are normal.  Cardiovascular: Normal rate.  Pulmonary/Chest: Effort normal.  Neurological: She is alert and oriented to person, place, and time.  Skin: Skin is dry. No pallor.  Psychiatric: She has a normal mood and affect. Her behavior is normal.  Vitals reviewed.       Assessment & Plan:  Orthostatic hypotension-blood pressure little on the low end today so is encouraged to continue to work on hydration.  She says she actually drinks about 90 cc of fluid daily.  Migraine  headaches-we discussed options.  We could try increasing propranolol since she has gotten some significant benefit so far.  She can take a half of a tab in the morning and a whole tab at bedtime and eventually if she does well increase to 40 mg twice a day.  She does have an appoint with her  new neurologist next week so certainly they can weigh in on what they think might be the best option.  Fatigue-I do think this could be related to the uncontrolled headaches I think if we can get that better it may improve.  We could always consider a low-dose stimulant during the daytime to see if that would help.  Memory loss-she did consult with a neuropsychiatrist.  She does not have premature Alzheimer's.  It was probably secondary to recurrent migraine headaches with prodromal an aura phase actually affecting her memory, attention concentration and auditory reception.  I do think she would benefit from a brain MRI for further work-up and evaluation.  She will discuss this with a neurologist next week.  But I am happy to order the test if needed.

## 2017-10-06 NOTE — Progress Notes (Signed)
NEUROLOGY CONSULTATION NOTE  Nancy Parks MRN: [id number] DOB: [birth year]  Referring provider: [name], MD Primary care provider: [name], MD  Reason for consult:  headaches  HISTORY OF PRESENT ILLNESS: Nancy Parks is a 67 year old right-handed female with depression, migraines, orthostatic hypotension and history of multiple myeloma s/p chemotherapy in remission since 07/2013 who presents for headaches.  History supplemented by referring provider's notes.  Onset:  History of ocular migraines.  Formally diagnosed with migraines in 2010.  Started having memory deficits around that time as well.  They resolved in 2016.  Starting in December 2018 started having memory deficits, fatigue. Location: right retro-orbital/right-occipital, right ear, right temple Quality:  Aching/tender, paresthesias on back of occipital region, sometimes stabbing pain in back of head Intensity:  10 (5/10 since starting propranolol).  She denies thunderclap headache or severe headache that wakes her from sleep. Aura:  See associated symptoms Associated symptoms:  Memory deficits (forgot about a recent party she threw for a friend), language dysfunction/dyslexia (does not know meaning of certain words or write incorrect sentences, word-finding difficulties), phonophobia.  She denies associated unilateral numbness or weakness.  Except, she had one migraine with language disorder/dyslexia associated with difficulty walking and decreased movement on the right.. Duration:  12 to 24 hours Frequency:  Daily (occasionally she may have up to 4 days in a row without one). Triggers:  no Exacerbating factors:  no Relieving factors:  no  Since around 2010, she has had memory deficits.  She also endorses episodes of language dysfunction and dyslexia.  Sometimes she doesn't know a meaning of a word or other times she writes the incorrect word.  This typically occurs with a migraine attack but occurs other  times as well.  She had neurocognitive testing in June 2019.  Findings were not consistent with a neurodegenerative disease (cortical or subcortical dementia).  Her cognitive symptoms are believed to be migraine-related.  Current NSAIDS:  no Current analgesics:  no Current triptans:   Sumatriptan 189m Current ergotamine:  no Current anti-emetic:  no Current muscle relaxants:  no Current anti-anxiolytic:  no Current sleep aide:  no Current Antihypertensive medications:  Propranolol 27min AM and 403mM Current Antidepressant medications:  Nortriptyline 63m17mince 2013 to help prevent her grinding her teeth) Current Anticonvulsant medications:  no Current anti-CGRP:  no Current Vitamins/Herbal/Supplements:  no Current Antihistamines/Decongestants:  no Other therapy:  no  Past NSAIDS:  ibuprofen Past analgesics:  Excedrin Migraine, tramadol Past abortive triptans:  Treximet, sumatriptan 100mg34mzatriptan 10mg 53m abortive ergotamine:  no Past muscle relaxants:  no Past anti-emetic:  no Past antihypertensive medications:  no Past antidepressant medications:  Amitriptyline 50mg, 65mlopram 20mg Pa5mnticonvulsant medications:  no Past anti-CGRP:  no Past vitamins/Herbal/Supplements:  no Past antihistamines/decongestants:  no Other past therapies:  no  Caffeine:  3 cups coffee daily Alcohol:  no Smoker:  no Diet:  hydrates Exercise:  Works out at gym 4 times a week Depression:  no; Anxiety:  no Sleep hygiene:  no   MRI of brain without contrast from 12/17/11 showed chronic small vessel ischemic changes but no acute abnormality. CTA of head and neck from 12/20/08 was normal.  PAST MEDICAL HISTORY: Past Medical History:  Diagnosis Date  . Depression   . Headache(784.0)    Migraines  . Leukopenia   . Leukopenia 07/13/2012  . Multiple myeloma, without mention of having achieved remission 07/13/2012    PAST SURGICAL HISTORY: Past Surgical History:  Procedure  Laterality  Date  . BASAL CELL CARCINOMA EXCISION  2013   removed from nose  . CATARACT EXTRACTION  09/2007  . LAPAROSCOPIC ENDOMETRIOSIS FULGURATION  1979    MEDICATIONS: Current Outpatient Medications on File Prior to Visit  Medication Sig Dispense Refill  . AMBULATORY NON FORMULARY MEDICATION Take 750 mg by mouth 2 (two) times daily. Cura med-Curcumin    . AMBULATORY NON FORMULARY MEDICATION Medication Name: digestive enzymes    . AMBULATORY NON FORMULARY MEDICATION Medication Name: 5 herbal energy- after meals    . AMBULATORY NON FORMULARY MEDICATION Medication Name: Blood Pressure monitor FAX: [phone] 1 each 0  . CALCIUM & MAGNESIUM CARBONATES PO Take 4 capsules by mouth daily.     . Cholecalciferol 5000 units capsule Take 5,000 Units by mouth daily.    . nortriptyline (PAMELOR) 25 MG capsule TAKE ONE CAPSULE BY MOUTH EVERY NIGHT AT BEDTIME 90 capsule 2  . OVER THE COUNTER MEDICATION Take 500 mg by mouth daily. Triphala    . propranolol (INDERAL) 40 MG tablet Take 40 mg by mouth daily.    . rizatriptan (MAXALT) 10 MG tablet Take 10 mg by mouth Nightly.    . SUMAtriptan (IMITREX) 100 MG tablet Take 100 mg by mouth as needed for migraine.     No current facility-administered medications on file prior to visit.     ALLERGIES: Allergies  Allergen Reactions  . Latex Rash    REACTION: Rash  REACTION: Rash  . Other Rash  . Tape Rash  . Fosamax [Alendronate Sodium] Other (See Comments)    GI upset    FAMILY HISTORY: Family History  Problem Relation Age of Onset  . Breast cancer Unknown        grandmother   . Colon cancer Father        colon adenoma  . Hyperlipidemia Sister   . Hypertension Sister   . Cancer Sister        throat, smoker  . Melanoma Sister     SOCIAL HISTORY: Social History   Socioeconomic History  . Marital status: Divorced    Spouse name: Not on file  . Number of children: Not on file  . Years of education: Not on file  . Highest education  level: Not on file  Occupational History  . 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 Occupation: Economist  Social Needs  . Financial resource strain: Not on file  . Food insecurity:    Worry: Not on file    Inability: Not on file  . Transportation needs:    Medical: Not on file    Non-medical: Not on file  Tobacco Use  . Smoking status: Never Smoker  . Smokeless tobacco: Never Used  . Tobacco comment: never used tobacco  Substance and Sexual Activity  . Alcohol use: No    Alcohol/week: 0.0 standard drinks  . Drug use: No  . Sexual activity: Yes    Partners: Male  Lifestyle  . Physical activity:    Days per week: Not on file    Minutes per session: Not on file  . Stress: Not on file  Relationships  . Social connections:    Talks on phone: Not on file    Gets together: Not on file    Attends religious service: Not on file    Active member of club or organization: Not on file    Attends meetings of clubs or organizations: Not on file    Relationship status: Not on  file  . Intimate partner violence:    Fear of current or ex partner: Not on file    Emotionally abused: Not on file    Physically abused: Not on file    Forced sexual activity: Not on file  Other Topics Concern  . Not on file  Social History Narrative   No exercise.  Doing 10 min of yoga.     REVIEW OF SYSTEMS: Constitutional: No fevers, chills, or sweats, no generalized fatigue, change in appetite Eyes: No visual changes, double vision, eye pain Ear, nose and throat: No hearing loss, ear pain, nasal congestion, sore throat Cardiovascular: No chest pain, palpitations Respiratory:  No shortness of breath at rest or with exertion, wheezes GastrointestinaI: No nausea, vomiting, diarrhea, abdominal pain, fecal incontinence Genitourinary:  No dysuria, urinary retention or frequency Musculoskeletal:  No neck pain, back pain Integumentary: No rash, pruritus, skin lesions Neurological: as  above Psychiatric: No depression, insomnia, anxiety Endocrine: No palpitations, fatigue, diaphoresis, mood swings, change in appetite, change in weight, increased thirst Hematologic/Lymphatic:  No purpura, petechiae. Allergic/Immunologic: no itchy/runny eyes, nasal congestion, recent allergic reactions, rashes  PHYSICAL EXAM: Blood pressure 98/62, pulse 64, height 5' 10"  (1.778 m), weight 163 lb (73.9 kg), SpO2 99 %. General: No acute distress.  Patient appears well-groomed.   Head:  Normocephalic/atraumatic Eyes:  fundi examined but not visualized Neck: supple, no paraspinal tenderness, full range of motion Back: No paraspinal tenderness Heart: regular rate and rhythm Lungs: Clear to auscultation bilaterally. Vascular: No carotid bruits. Neurological Exam: Mental status: alert and oriented to person, place, and time, recent and remote memory intact, fund of knowledge intact, attention and concentration intact, speech fluent and not dysarthric, language intact. Cranial nerves: CN I: not tested CN II: pupils equal, round and reactive to light, visual fields intact CN III, IV, VI:  full range of motion, no nystagmus, no ptosis CN V: facial sensation intact CN VII: upper and lower face symmetric CN VIII: hearing intact CN IX, X: gag intact, uvula midline CN XI: sternocleidomastoid and trapezius muscles intact CN XII: tongue midline Bulk & Tone: normal, no fasciculations. Motor:  5/5 throughout  Sensation: temperature and vibration sensation intact. Deep Tendon Reflexes:  2+ throughout, toes downgoing.  Finger to nose testing:  Without dysmetria.  Heel to shin:  Without dysmetria.  Gait:  Normal station and stride.  Able to turn. Romberg negative.  IMPRESSION: Migraine with aura Memory deficits/language dysfunction -  Possibly migraine aura.  Neurocognitive testing is not consistent with neurodegenerative process  PLAN: 1.  MRI of brain with and without contrast 2.  Increase  nortriptyline to 50m at bedtime.  We can increase dose to 1014min 4 weeks if needed. 3.  Take sumatriptan 10059mith naproxen 500m80mr abortive therapy 4.  Limit pain relievers to no more than 2 days out of week to prevent rebound headache 5.  Keep headache diary 6.  Follow up in 3 to 4 months.  Thank you for allowing me to take part in the care of this patient.  AdamMetta Clines  CC:  CathBeatrice Lecher

## 2017-10-07 ENCOUNTER — Ambulatory Visit: Payer: Medicare HMO | Admitting: Neurology

## 2017-10-07 ENCOUNTER — Encounter: Payer: Self-pay | Admitting: Neurology

## 2017-10-07 VITALS — BP 98/62 | HR 64 | Ht 70.0 in | Wt 163.0 lb

## 2017-10-07 DIAGNOSIS — G43109 Migraine with aura, not intractable, without status migrainosus: Secondary | ICD-10-CM | POA: Diagnosis not present

## 2017-10-07 DIAGNOSIS — R4701 Aphasia: Secondary | ICD-10-CM | POA: Diagnosis not present

## 2017-10-07 DIAGNOSIS — R480 Dyslexia and alexia: Secondary | ICD-10-CM

## 2017-10-07 MED ORDER — NAPROXEN 500 MG PO TABS: ORAL_TABLET | ORAL | 3 refills | Status: AC

## 2017-10-07 MED ORDER — NORTRIPTYLINE HCL 50 MG PO CAPS: 50.0000 mg | ORAL_CAPSULE | Freq: Every day | ORAL | 3 refills | Status: DC

## 2017-10-07 NOTE — Patient Instructions (Addendum)
1.  We will check MRI of brain with and without contrast 2.  I increased nortriptyline to 50mg  at bedtime.  If migraines not improved in 4 to 6 weeks, contact me and we can increase dose 3.  Take sumatriptan 100mg  WITH naproxen 500mg  at earliest onset of migraine.  May repeat dose once after 2 hours if needed.  Do not exceed total of 200mg  sumatriptan and 1000mg  naproxen in 24 hours 4.  Limit pain relievers to no more than 2 days out of week to prevent rebound headache 5.  Keep headache diary 6.  Follow up in 3 to 4 months.  We have sent a referral to [facility] for your MRI and they will call you directly to schedule your appt. They are located at [place]. If you need to contact them directly, or they have not contacted you within 5 days,  please call [phone].

## 2017-10-08 ENCOUNTER — Encounter

## 2017-10-08 ENCOUNTER — Ambulatory Visit: Payer: Self-pay | Admitting: Neurology

## 2017-10-13 ENCOUNTER — Encounter: Payer: Self-pay | Admitting: Family Medicine

## 2017-10-17 ENCOUNTER — Ambulatory Visit
Admission: RE | Admit: 2017-10-17 | Discharge: 2017-10-17 | Disposition: A | Discharge status: Home / Self Care | Payer: Medicare HMO | Source: Ambulatory Visit | Attending: Neurology | Admitting: Neurology

## 2017-10-17 DIAGNOSIS — R4701 Aphasia: Secondary | ICD-10-CM

## 2017-10-17 MED ORDER — GADOBENATE DIMEGLUMINE 529 MG/ML IV SOLN
15.0000 mL | Freq: Once | INTRAVENOUS | Status: AC | PRN
  Administered 2017-10-17: 15 mL via INTRAVENOUS

## 2017-10-22 ENCOUNTER — Telehealth: Payer: Self-pay | Admitting: Neurology

## 2017-10-22 NOTE — Telephone Encounter (Signed)
Attempted to reach Pt. Sounded like someone answered, but then there was no sound.

## 2017-10-22 NOTE — Telephone Encounter (Signed)
Patient called stating she needs help understanding the Results of her recent MRI. Please call her back at [phone]. Thanks!

## 2017-10-22 NOTE — Telephone Encounter (Signed)
Please advise 

## 2017-10-26 ENCOUNTER — Telehealth: Payer: Self-pay

## 2017-10-26 NOTE — Telephone Encounter (Signed)
-----   Message from Pieter Partridge, DO sent at 10/21/2017 10:52 AM EDT ----- Reviewed MRI of brain.  It shows some spots on the brain which is nonspecific and may be of uncertain clinical significance.  Sometimes it is age-related or seen in patients with history of migraine.  We can discuss further at follow up appointment.

## 2017-10-26 NOTE — Telephone Encounter (Signed)
Called and spoke with Pt about MRI results

## 2017-10-27 ENCOUNTER — Telehealth: Payer: Self-pay | Admitting: *Deleted

## 2017-10-27 NOTE — Telephone Encounter (Signed)
-----   Message from Chester Holstein, LPN sent at 3/57/8978 11:35 AM EDT -----   ----- Message ----- From: Pieter Partridge, DO Sent: 10/21/2017  10:52 AM EDT To: Clois Comber, CMA  Reviewed MRI of brain.  It shows some spots on the brain which is nonspecific and may be of uncertain clinical significance.  Sometimes it is age-related or seen in patients with history of migraine.  We can discuss further at follow up appointment.

## 2017-10-28 ENCOUNTER — Encounter: Payer: Self-pay | Admitting: Family Medicine

## 2017-10-28 ENCOUNTER — Ambulatory Visit (INDEPENDENT_AMBULATORY_CARE_PROVIDER_SITE_OTHER): Payer: Medicare HMO | Admitting: Family Medicine

## 2017-10-28 VITALS — BP 114/67 | HR 68 | Ht 70.0 in | Wt 156.0 lb

## 2017-10-28 DIAGNOSIS — E672 Megavitamin-B6 syndrome: Secondary | ICD-10-CM | POA: Diagnosis not present

## 2017-10-28 DIAGNOSIS — Z13220 Encounter for screening for lipoid disorders: Secondary | ICD-10-CM | POA: Diagnosis not present

## 2017-10-28 DIAGNOSIS — R799 Abnormal finding of blood chemistry, unspecified: Secondary | ICD-10-CM

## 2017-10-28 DIAGNOSIS — Z0000 Encounter for general adult medical examination without abnormal findings: Secondary | ICD-10-CM | POA: Diagnosis not present

## 2017-10-28 NOTE — Patient Instructions (Addendum)
Thank you for coming in for your Medicare wellness exam today.    Your preventative care is up-to-date which is fantastic.    Discuss getting the Pneumovax 23 and the Shingrix vaccine with oncology.  We are more than happy to give these to you if they are okay with it. Think it would be fantastic for you to start doing some therapy.    Health Maintenance for Postmenopausal Women Menopause is a normal process in which your reproductive ability comes to an end. This process happens gradually over a span of months to years, usually between the ages of 60 and 19. Menopause is complete when you have missed 12 consecutive menstrual periods. It is important to talk with your health care provider about some of the most common conditions that affect postmenopausal women, such as heart disease, cancer, and bone loss (osteoporosis). Adopting a healthy lifestyle and getting preventive care can help to promote your health and wellness. Those actions can also lower your chances of developing some of these common conditions. What should I know about menopause? During menopause, you may experience a number of symptoms, such as:  Moderate-to-severe hot flashes.  Night sweats.  Decrease in sex drive.  Mood swings.  Headaches.  Tiredness.  Irritability.  Memory problems.  Insomnia.  Choosing to treat or not to treat menopausal changes is an individual decision that you make with your health care provider. What should I know about hormone replacement therapy and supplements? Hormone therapy products are effective for treating symptoms that are associated with menopause, such as hot flashes and night sweats. Hormone replacement carries certain risks, especially as you become older. If you are thinking about using estrogen or estrogen with progestin treatments, discuss the benefits and risks with your health care provider. What should I know about heart disease and stroke? Heart disease, heart attack,  and stroke become more likely as you age. This may be due, in part, to the hormonal changes that your body experiences during menopause. These can affect how your body processes dietary fats, triglycerides, and cholesterol. Heart attack and stroke are both medical emergencies. There are many things that you can do to help prevent heart disease and stroke:  Have your blood pressure checked at least every 1-2 years. High blood pressure causes heart disease and increases the risk of stroke.  If you are 76-53 years old, ask your health care provider if you should take aspirin to prevent a heart attack or a stroke.  Do not use any tobacco products, including cigarettes, chewing tobacco, or electronic cigarettes. If you need help quitting, ask your health care provider.  It is important to eat a healthy diet and maintain a healthy weight. ? Be sure to include plenty of vegetables, fruits, low-fat dairy products, and lean protein. ? Avoid eating foods that are high in solid fats, added sugars, or salt (sodium).  Get regular exercise. This is one of the most important things that you can do for your health. ? Try to exercise for at least 150 minutes each week. The type of exercise that you do should increase your heart rate and make you sweat. This is known as moderate-intensity exercise. ? Try to do strengthening exercises at least twice each week. Do these in addition to the moderate-intensity exercise.  Know your numbers.Ask your health care provider to check your cholesterol and your blood glucose. Continue to have your blood tested as directed by your health care provider.  What should I know about cancer  screening? There are several types of cancer. Take the following steps to reduce your risk and to catch any cancer development as early as possible. Breast Cancer  Practice breast self-awareness. ? This means understanding how your breasts normally appear and feel. ? It also means doing  regular breast self-exams. Let your health care provider know about any changes, no matter how small.  If you are 24 or older, have a clinician do a breast exam (clinical breast exam or CBE) every year. Depending on your age, family history, and medical history, it may be recommended that you also have a yearly breast X-ray (mammogram).  If you have a family history of breast cancer, talk with your health care provider about genetic screening.  If you are at high risk for breast cancer, talk with your health care provider about having an MRI and a mammogram every year.  Breast cancer (BRCA) gene test is recommended for women who have family members with BRCA-related cancers. Results of the assessment will determine the need for genetic counseling and BRCA1 and for BRCA2 testing. BRCA-related cancers include these types: ? Breast. This occurs in males or females. ? Ovarian. ? Tubal. This may also be called fallopian tube cancer. ? Cancer of the abdominal or pelvic lining (peritoneal cancer). ? Prostate. ? Pancreatic.  Cervical, Uterine, and Ovarian Cancer Your health care provider may recommend that you be screened regularly for cancer of the pelvic organs. These include your ovaries, uterus, and vagina. This screening involves a pelvic exam, which includes checking for microscopic changes to the surface of your cervix (Pap test).  For women ages 21-65, health care providers may recommend a pelvic exam and a Pap test every three years. For women ages 40-65, they may recommend the Pap test and pelvic exam, combined with testing for human papilloma virus (HPV), every five years. Some types of HPV increase your risk of cervical cancer. Testing for HPV may also be done on women of any age who have unclear Pap test results.  Other health care providers may not recommend any screening for nonpregnant women who are considered low risk for pelvic cancer and have no symptoms. Ask your health care provider  if a screening pelvic exam is right for you.  If you have had past treatment for cervical cancer or a condition that could lead to cancer, you need Pap tests and screening for cancer for at least 20 years after your treatment. If Pap tests have been discontinued for you, your risk factors (such as having a new sexual partner) need to be reassessed to determine if you should start having screenings again. Some women have medical problems that increase the chance of getting cervical cancer. In these cases, your health care provider may recommend that you have screening and Pap tests more often.  If you have a family history of uterine cancer or ovarian cancer, talk with your health care provider about genetic screening.  If you have vaginal bleeding after reaching menopause, tell your health care provider.  There are currently no reliable tests available to screen for ovarian cancer.  Lung Cancer Lung cancer screening is recommended for adults 70-4 years old who are at high risk for lung cancer because of a history of smoking. A yearly low-dose CT scan of the lungs is recommended if you:  Currently smoke.  Have a history of at least 30 pack-years of smoking and you currently smoke or have quit within the past 15 years. A pack-year is smoking  an average of one pack of cigarettes per day for one year.  Yearly screening should:  Continue until it has been 15 years since you quit.  Stop if you develop a health problem that would prevent you from having lung cancer treatment.  Colorectal Cancer  This type of cancer can be detected and can often be prevented.  Routine colorectal cancer screening usually begins at age 61 and continues through age 63.  If you have risk factors for colon cancer, your health care provider may recommend that you be screened at an earlier age.  If you have a family history of colorectal cancer, talk with your health care provider about genetic screening.  Your  health care provider may also recommend using home test kits to check for hidden blood in your stool.  A small camera at the end of a tube can be used to examine your colon directly (sigmoidoscopy or colonoscopy). This is done to check for the earliest forms of colorectal cancer.  Direct examination of the colon should be repeated every 5-10 years until age 61. However, if early forms of precancerous polyps or small growths are found or if you have a family history or genetic risk for colorectal cancer, you may need to be screened more often.  Skin Cancer  Check your skin from head to toe regularly.  Monitor any moles. Be sure to tell your health care provider: ? About any new moles or changes in moles, especially if there is a change in a mole's shape or color. ? If you have a mole that is larger than the size of a pencil eraser.  If any of your family members has a history of skin cancer, especially at a young age, talk with your health care provider about genetic screening.  Always use sunscreen. Apply sunscreen liberally and repeatedly throughout the day.  Whenever you are outside, protect yourself by wearing long sleeves, pants, a wide-brimmed hat, and sunglasses.  What should I know about osteoporosis? Osteoporosis is a condition in which bone destruction happens more quickly than new bone creation. After menopause, you may be at an increased risk for osteoporosis. To help prevent osteoporosis or the bone fractures that can happen because of osteoporosis, the following is recommended:  If you are 41-63 years old, get at least 1,000 mg of calcium and at least 600 mg of vitamin D per day.  If you are older than age 97 but younger than age 64, get at least 1,200 mg of calcium and at least 600 mg of vitamin D per day.  If you are older than age 51, get at least 1,200 mg of calcium and at least 800 mg of vitamin D per day.  Smoking and excessive alcohol intake increase the risk of  osteoporosis. Eat foods that are rich in calcium and vitamin D, and do weight-bearing exercises several times each week as directed by your health care provider. What should I know about how menopause affects my mental health? Depression may occur at any age, but it is more common as you become older. Common symptoms of depression include:  Low or sad mood.  Changes in sleep patterns.  Changes in appetite or eating patterns.  Feeling an overall lack of motivation or enjoyment of activities that you previously enjoyed.  Frequent crying spells.  Talk with your health care provider if you think that you are experiencing depression. What should I know about immunizations? It is important that you get and maintain your  immunizations. These include:  Tetanus, diphtheria, and pertussis (Tdap) booster vaccine.  Influenza every year before the flu season begins.  Pneumonia vaccine.  Shingles vaccine.  Your health care provider may also recommend other immunizations. This information is not intended to replace advice given to you by your health care provider. Make sure you discuss any questions you have with your health care provider. Document Released: 03/28/2005 Document Revised: 08/24/2015 Document Reviewed: 11/07/2014 Elsevier Interactive Patient Education  2018 [employer].

## 2017-10-28 NOTE — Progress Notes (Signed)
Subjective:   Nancy Parks is a 67 y.o. female who presents for Medicare Annual (Subsequent) preventive examination.  Review of Systems:  Comprehensive ROS is negative.   Cardiac Risk Factors include: advanced age (>48mn, >>48women)     Objective:     Vitals: BP 114/67   Pulse 68   Ht 5' 10"  (1.778 m)   Wt 156 lb (70.8 kg)   SpO2 100%   BMI 22.38 kg/m   Body mass index is 22.38 kg/m.  Advanced Directives 10/28/2017 07/06/2017 02/29/2016 08/29/2015 03/20/2015 02/28/2015 11/07/2014  Does Patient Have a Medical Advance Directive? No No No No No No No  Would patient like information on creating a medical advance directive? No - Patient declined - - No - patient declined information - No - patient declined information No - patient declined information    Tobacco Social History   Tobacco Use  Smoking Status Never Smoker  Smokeless Tobacco Never Used  Tobacco Comment   never used tobacco     Counseling given: Not Answered Comment: never used tobacco   Clinical Intake:  Pre-visit preparation completed: Yes        Nutritional Status: BMI of 19-24  Normal Diabetes: No     Interpreter Needed?: No    Physical Exam  Constitutional: She is oriented to person, place, and time. She appears well-developed and well-nourished.  HENT:  Head: Normocephalic and atraumatic.  Right Ear: External ear normal.  Left Ear: External ear normal.  Nose: Nose normal.  Mouth/Throat: Oropharynx is clear and moist.  TMs and canals are clear.   Eyes: Pupils are equal, round, and reactive to light. Conjunctivae and EOM are normal.  Neck: Neck supple. No thyromegaly present.  Cardiovascular: Normal rate, regular rhythm and normal heart sounds.  Pulmonary/Chest: Effort normal and breath sounds normal. She has no wheezes.  Lymphadenopathy:    She has no cervical adenopathy.  Neurological: She is alert and oriented to person, place, and time.  Skin: Skin is warm and dry.  Psychiatric: She  has a normal mood and affect.     Past Medical History:  Diagnosis Date  . Depression   . Headache(784.0)    Migraines  . Leukopenia   . Leukopenia 07/13/2012  . Multiple myeloma, without mention of having achieved remission 07/13/2012   Past Surgical History:  Procedure Laterality Date  . BASAL CELL CARCINOMA EXCISION  2013   removed from nose  . CATARACT EXTRACTION  09/2007  . LAPAROSCOPIC ENDOMETRIOSIS FULGURATION  1979   Family History  Problem Relation Age of Onset  . Breast cancer Unknown        grandmother   . Colon cancer Father        colon adenoma  . Heart attack Father   . Hyperlipidemia Sister   . Hypertension Sister   . Cancer Sister        throat, smoker  . Melanoma Sister   . Diabetes Sister   . CVA Mother   . Diabetes Brother    Social History   Socioeconomic History  . Marital status: Divorced    Spouse name: Not on file  . Number of children: Not on file  . Years of education: Not on file  . Highest education level: Bachelor's degree (e.g., BA, AB, BS)  Occupational History  . 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 Occupation: [employer] Social Needs  . Financial resource strain: Not on file  . Food insecurity:  Worry: Not on file    Inability: Not on file  . Transportation needs:    Medical: Not on file    Non-medical: Not on file  Tobacco Use  . Smoking status: Never Smoker  . Smokeless tobacco: Never Used  . Tobacco comment: never used tobacco  Substance and Sexual Activity  . Alcohol use: No    Alcohol/week: 0.0 standard drinks  . Drug use: No  . Sexual activity: Yes    Partners: Male  Lifestyle  . Physical activity:    Days per week: Not on file    Minutes per session: Not on file  . Stress: Not on file  Relationships  . Social connections:    Talks on phone: Not on file    Gets together: Not on file    Attends religious service: Not on file    Active member of club or organization: Not on file    Attends  meetings of clubs or organizations: Not on file    Relationship status: Not on file  Other Topics Concern  . Not on file  Social History Narrative   No exercise.  Doing 10 min of yoga.       Patient is right-handed. She lives alone in a 2 story home. She drinks 3 cups of coffee a day. Seh works out at [employer] 4 x week.    Outpatient Encounter Medications as of 10/28/2017  Medication Sig  . AMBULATORY NON FORMULARY MEDICATION Take 750 mg by mouth 2 (two) times daily. Cura med-Curcumin  . AMBULATORY NON FORMULARY MEDICATION Medication Name: digestive enzymes  . AMBULATORY NON FORMULARY MEDICATION Medication Name: 5 herbal energy- after meals  . CALCIUM & MAGNESIUM CARBONATES PO Take 4 capsules by mouth daily.   . Cholecalciferol 5000 units capsule Take 5,000 Units by mouth daily.  . naproxen (NAPROSYN) 500 MG tablet Take 1 tablet with each sumatriptan as directed.  . nortriptyline (PAMELOR) 50 MG capsule Take 1 capsule (50 mg total) by mouth at bedtime.  Marland Kitchen OVER THE COUNTER MEDICATION Take 500 mg by mouth daily. Triphala  . propranolol (INDERAL) 40 MG tablet Take 40 mg by mouth daily.  . SUMAtriptan (IMITREX) 100 MG tablet Take 100 mg by mouth as needed for migraine.  . [DISCONTINUED] AMBULATORY NON FORMULARY MEDICATION Medication Name: Blood Pressure monitor FAX: [phone] (Patient not taking: Reported on 10/07/2017)  . [DISCONTINUED] rizatriptan (MAXALT) 10 MG tablet Take 10 mg by mouth Nightly.   No facility-administered encounter medications on file as of 10/28/2017.     Activities of Daily Living In your present state of health, do you have any difficulty performing the following activities: 10/28/2017  Hearing? Y  Vision? N  Difficulty concentrating or making decisions? N  Walking or climbing stairs? N  Dressing or bathing? N  Doing errands, shopping? N  Preparing Food and eating ? N  Using the Toilet? N  In the past six months, have you accidently leaked urine? N  Do you  have problems with loss of bowel control? N  Managing your Medications? N  Managing your Finances? N  Housekeeping or managing your Housekeeping? N  Some recent data might be hidden    Patient Care Team: Hali Marry, MD as PCP - General Dr. Cherylann Banas (Neurology)    Assessment:   This is a routine wellness examination for Shayanna.  Exercise Activities and Dietary recommendations Current Exercise Habits: Structured exercise class, Frequency (Times/Week): 3, Intensity: Moderate  Goals   None  Fall Risk Fall Risk  10/28/2017 10/28/2017 05/06/2016 10/01/2015 08/29/2015  Falls in the past year? No No No No No   Depression Screen PHQ 2/9 Scores 10/28/2017 04/27/2017 05/06/2016 10/01/2015  PHQ - 2 Score 3 3 0 0  PHQ- 9 Score 11 8 - -     Cognitive Function     6CIT Screen 10/28/2017  What Year? 0 points  What month? 0 points  What time? 0 points  Count back from 20 0 points  Months in reverse 0 points  Repeat phrase 2 points  Total Score 2    Immunization History  Administered Date(s) Administered  . Td 06/18/2003, 07/12/2010    Qualifies for Shingles Vaccine? Yes.   Screening Tests Health Maintenance  Topic Date Due  . INFLUENZA VACCINE  12/28/2017 (Originally 09/17/2017)  . PNA vac Low Risk Adult (1 of 2 - PCV13) 05/06/2021 (Originally 04/01/2015)  . COLONOSCOPY  02/18/2019  . MAMMOGRAM  07/02/2019  . TETANUS/TDAP  07/11/2020  . DEXA SCAN  05/23/2021  . Hepatitis C Screening  Completed    Cancer Screenings: Lung: Low Dose CT Chest recommended if Age 14-80 years, 30 pack-year currently smoking OR have quit w/in 15years. Patient does not qualify. Breast:  Up to date on Mammogram? Yes   Up to date of Bone Density/Dexa? Yes Colorectal: Up to date  Additional Screenings: Hepatitis C Screening:      Plan:     I have personally reviewed and noted the following in the patient's chart:   . Medical and social history . Use of alcohol, tobacco or  illicit drugs  . Current medications and supplements . Functional ability and status . Nutritional status . Physical activity - usually goes to the gym . Advanced directives - UP to date.   . List of other physicians . Hospitalizations, surgeries, and ER visits in previous 12 months . Vitals . Screenings to include cognitive, depression, and falls . Referrals and appointments  In addition, I have reviewed and discussed with patient certain preventive protocols, quality metrics, and best practice recommendations. A written personalized care plan for preventive services as well as general preventive health recommendations were provided to patient.     [name], MD  10/30/2017

## 2017-10-30 ENCOUNTER — Encounter: Payer: Self-pay | Admitting: Family Medicine

## 2017-11-02 LAB — COMPLETE METABOLIC PANEL WITH GFR
AG RATIO: 1.8 (calc) (ref 1.0–2.5)
ALKALINE PHOSPHATASE (APISO): 108 U/L (ref 33–130)
ALT: 15 U/L (ref 6–29)
AST: 16 U/L (ref 10–35)
Albumin: 4.2 g/dL (ref 3.6–5.1)
BUN: 19 mg/dL (ref 7–25)
CALCIUM: 9.2 mg/dL (ref 8.6–10.4)
CO2: 28 mmol/L (ref 20–32)
Chloride: 102 mmol/L (ref 98–110)
Creat: 0.95 mg/dL (ref 0.50–0.99)
GFR, EST NON AFRICAN AMERICAN: 62 mL/min/{1.73_m2} (ref >=60)
GFR, Est African American: 72 mL/min/{1.73_m2} (ref >=60)
GLOBULIN: 2.4 g/dL (ref 1.9–3.7)
Glucose, Bld: 88 mg/dL (ref 65–99)
Potassium: 4.6 mmol/L (ref 3.5–5.3)
SODIUM: 137 mmol/L (ref 135–146)
Total Bilirubin: 0.6 mg/dL (ref 0.2–1.2)
Total Protein: 6.6 g/dL (ref 6.1–8.1)

## 2017-11-02 LAB — VITAMIN B1: VITAMIN B1 (THIAMINE): 12 nmol/L (ref 8–30)

## 2017-11-02 LAB — LIPID PANEL
Cholesterol: 225 mg/dL — ABNORMAL HIGH (ref <200)
HDL: 60 mg/dL (ref >50)
LDL Cholesterol (Calc): 142 mg/dL (calc) — ABNORMAL HIGH
NON-HDL CHOLESTEROL (CALC): 165 mg/dL — AB (ref <130)
Total CHOL/HDL Ratio: 3.8 (calc) (ref <5.0)
Triglycerides: 110 mg/dL (ref <150)

## 2017-11-02 LAB — VITAMIN B6: VITAMIN B6: 35.3 ng/mL — AB (ref 2.1–21.7)

## 2017-11-02 LAB — VITAMIN B12: VITAMIN B 12: 277 pg/mL (ref 200–1100)

## 2017-11-03 ENCOUNTER — Encounter: Payer: Self-pay | Admitting: Family Medicine

## 2017-11-09 DIAGNOSIS — Z9842 Cataract extraction status, left eye: Secondary | ICD-10-CM | POA: Diagnosis not present

## 2017-11-09 DIAGNOSIS — Z9841 Cataract extraction status, right eye: Secondary | ICD-10-CM | POA: Diagnosis not present

## 2017-11-09 DIAGNOSIS — Z961 Presence of intraocular lens: Secondary | ICD-10-CM | POA: Diagnosis not present

## 2017-11-23 ENCOUNTER — Ambulatory Visit: Payer: Medicare HMO | Admitting: Cardiology

## 2017-11-23 ENCOUNTER — Encounter: Payer: Self-pay | Admitting: Cardiology

## 2017-11-23 VITALS — BP 118/68 | HR 82 | Ht 70.0 in | Wt 167.8 lb

## 2017-11-23 DIAGNOSIS — I951 Orthostatic hypotension: Secondary | ICD-10-CM

## 2017-11-23 DIAGNOSIS — R0609 Other forms of dyspnea: Secondary | ICD-10-CM | POA: Diagnosis not present

## 2017-11-23 DIAGNOSIS — G43109 Migraine with aura, not intractable, without status migrainosus: Secondary | ICD-10-CM | POA: Diagnosis not present

## 2017-11-23 NOTE — Progress Notes (Signed)
Cardiology Office Note:    Date:  11/23/2017   ID:  Nancy Parks, DOB 1950-05-04, MRN [id number]  PCP:  Nancy Marry, MD  Cardiologist:  Nancy Campus, MD    Referring MD: Nancy Parks, *   Chief Complaint  Patient presents with  . Follow-up  Doing much better  History of Present Illness:    Nancy Parks is a 67 y.o. female whom I seen before because of orthostatic hypotension.  She also complaining of having dyspnea on exertion.  Echocardiogram was done was normal she was find to have orthostatic hypotension.  Has been treated with increased fluid as well as salt intake she seems to be doing well she also gets significant migraine headaches which bothers her a lot however lately things are getting much better interestingly for migraine she was put on propranolol with some reservation concerning her orthostatic hypotension however she seems to be tolerating propranolol very well orthostatic hypotension improved from migraines improved as well overall she is doing better  Past Medical History:  Diagnosis Date  . Depression   . Headache(784.0)    Migraines  . Leukopenia   . Leukopenia 07/13/2012  . Multiple myeloma, without mention of having achieved remission 07/13/2012    Past Surgical History:  Procedure Laterality Date  . BASAL CELL CARCINOMA EXCISION  2013   removed from nose  . CATARACT EXTRACTION  09/2007  . LAPAROSCOPIC ENDOMETRIOSIS FULGURATION  1979    Current Medications: Current Meds  Medication Sig  . AMBULATORY NON FORMULARY MEDICATION Take 750 mg by mouth 2 (two) times daily. Cura med-Curcumin  . AMBULATORY NON FORMULARY MEDICATION Medication Name: digestive enzymes  . AMBULATORY NON FORMULARY MEDICATION Medication Name: 5 herbal energy- after meals  . CALCIUM & MAGNESIUM CARBONATES PO Take 4 capsules by mouth daily.   . Cholecalciferol 5000 units capsule Take 5,000 Units by mouth daily.  . naproxen (NAPROSYN) 500 MG tablet Take 1 tablet with  each sumatriptan as directed.  . nortriptyline (PAMELOR) 50 MG capsule Take 1 capsule (50 mg total) by mouth at bedtime.  Marland Kitchen OVER THE COUNTER MEDICATION Take 500 mg by mouth daily. Triphala  . propranolol (INDERAL) 40 MG tablet Take 40 mg by mouth daily.  . SUMAtriptan (IMITREX) 100 MG tablet Take 100 mg by mouth as needed for migraine.  . vitamin B-12 (CYANOCOBALAMIN) 1000 MCG tablet Take 1,000 mcg by mouth daily. Increase to 2000 mcg if needed     Allergies:   Latex; Other; Tape; and Fosamax [alendronate sodium]   Social History   Socioeconomic History  . Marital status: Divorced    Spouse name: Not on file  . Number of children: Not on file  . Years of education: Not on file  . Highest education level: Bachelor's degree (e.g., BA, AB, BS)  Occupational History  . Occupation: Aeronautical engineer: [employer]  . Occupation: Economist  Social Needs  . Financial resource strain: Not on file  . Food insecurity:    Worry: Not on file    Inability: Not on file  . Transportation needs:    Medical: Not on file    Non-medical: Not on file  Tobacco Use  . Smoking status: Never Smoker  . Smokeless tobacco: Never Used  . Tobacco comment: never used tobacco  Substance and Sexual Activity  . Alcohol use: No    Alcohol/week: 0.0 standard drinks  . Drug use: No  . Sexual activity: Yes    Partners: Male  Lifestyle  .  Physical activity:    Days per week: Not on file    Minutes per session: Not on file  . Stress: Not on file  Relationships  . Social connections:    Talks on phone: Not on file    Gets together: Not on file    Attends religious service: Not on file    Active member of club or organization: Not on file    Attends meetings of clubs or organizations: Not on file    Relationship status: Not on file  Other Topics Concern  . Not on file  Social History Narrative   No exercise.  Doing 10 min of yoga.       Patient is right-handed. She lives alone in a 2 story home.  She drinks 3 cups of coffee a day. Seh works out at [employer] 4 x week.     Family History: The patient's family history includes Breast cancer in her unknown relative; CVA in her mother; Cancer in her sister; Colon cancer in her father; Diabetes in her brother and sister; Heart attack in her father; Hyperlipidemia in her sister; Hypertension in her sister; Melanoma in her sister. ROS:   Please see the history of present illness.    All 14 point review of systems negative except as described per history of present illness  EKGs/Labs/Other Studies Reviewed:      Recent Labs: 04/27/2017: Magnesium 2.3 06/01/2017: TSH 1.70 07/06/2017: Hemoglobin 13.3; Platelet Count 265 10/28/2017: ALT 15; BUN 19; Creat 0.95; Potassium 4.6; Sodium 137  Recent Lipid Panel    Component Value Date/Time   CHOL 225 (H) 10/28/2017 1016   TRIG 110 10/28/2017 1016   HDL 60 10/28/2017 1016   CHOLHDL 3.8 10/28/2017 1016   VLDL 26 10/01/2015 1037   LDLCALC 142 (H) 10/28/2017 1016    Physical Exam:    VS:  BP 118/68   Pulse 82   Ht _0  (1.778 m)   Wt 167 lb 12.8 oz (76.1 kg)   SpO2 97%   BMI 24.08 kg/m     Wt Readings from Last 3 Encounters:  11/23/17 167 lb 12.8 oz (76.1 kg)  10/28/17 156 lb (70.8 kg)  10/07/17 163 lb (73.9 kg)     GEN:  Well nourished, well developed in no acute distress HEENT: Normal NECK: No JVD; No carotid bruits LYMPHATICS: No lymphadenopathy CARDIAC: RRR, no murmurs, no rubs, no gallops RESPIRATORY:  Clear to auscultation without rales, wheezing or rhonchi  ABDOMEN: Soft, non-tender, non-distended MUSCULOSKELETAL:  No edema; No deformity  SKIN: Warm and dry LOWER EXTREMITIES: no swelling NEUROLOGIC:  Alert and oriented x 3 PSYCHIATRIC:  Normal affect   ASSESSMENT:    1. Orthostatic hypotension   2. Migraine with aura and without status migrainosus, not intractable   3. Dyspnea on exertion    PLAN:    In order of problems listed above:  1. Orthostatic  hypotension.  Patient doing well from that point review I will continue present management I stressed again importance of drinking plenty of fluids as well as taking some extra salt. 2. Migraine better with small dose of beta-blocker. 3. Dyspnea on exertion echocardiogram showed preserved left ventricular ejection fraction.  She does not have any chest pain.  She is to go to the gym on the regular basis but now slow down somewhat because of the fact when she was trying to leave the way to provoke her migraine since she is doing better from migraine point of view  she is trying to return to gym again.   Medication Adjustments/Labs and Tests Ordered: Current medicines are reviewed at length with the patient today.  Concerns regarding medicines are outlined above.  No orders of the defined types were placed in this encounter.  Medication changes: No orders of the defined types were placed in this encounter.   Signed, Park Liter, MD, [facility] 11/23/2017 3:11 PM    [place]

## 2017-11-23 NOTE — Patient Instructions (Signed)
Medication Instructions:  Your physician recommends that you continue on your current medications as directed. Please refer to the Current Medication list given to you today.  If you need a refill on your cardiac medications before your next appointment, please call your pharmacy.   Lab work: None ordered If you have labs (blood work) drawn today and your tests are completely normal, you will receive your results only by: . MyChart Message (if you have MyChart) OR . A paper copy in the mail If you have any lab test that is abnormal or we need to change your treatment, we will call you to review the results.  Testing/Procedures: None ordered  Follow-Up: At [facility], you and your health needs are our priority.  As part of our continuing mission to provide you with exceptional heart care, we have created designated Provider Care Teams.  These Care Teams include your primary Cardiologist (physician) and Advanced Practice Providers (APPs -  Physician Assistants and Nurse Practitioners) who all work together to provide you with the care you need, when you need it. You will need a follow up appointment in 6 months.  Please call our office 2 months in advance to schedule this appointment.  You may see  Robert Krasowski or another member of our [facility] Provider Team in [place]: Brian Munley, MD . Rajan Revankar, MD  Any Other Special Instructions Will Be Listed Below (If Applicable).    

## 2017-12-02 DIAGNOSIS — Z85828 Personal history of other malignant neoplasm of skin: Secondary | ICD-10-CM | POA: Diagnosis not present

## 2017-12-02 DIAGNOSIS — Z09 Encounter for follow-up examination after completed treatment for conditions other than malignant neoplasm: Secondary | ICD-10-CM | POA: Diagnosis not present

## 2017-12-02 DIAGNOSIS — Z872 Personal history of diseases of the skin and subcutaneous tissue: Secondary | ICD-10-CM | POA: Diagnosis not present

## 2017-12-02 DIAGNOSIS — Z08 Encounter for follow-up examination after completed treatment for malignant neoplasm: Secondary | ICD-10-CM | POA: Diagnosis not present

## 2017-12-02 DIAGNOSIS — L821 Other seborrheic keratosis: Secondary | ICD-10-CM | POA: Diagnosis not present

## 2017-12-21 ENCOUNTER — Ambulatory Visit (INDEPENDENT_AMBULATORY_CARE_PROVIDER_SITE_OTHER): Payer: Medicare HMO | Admitting: Family Medicine

## 2017-12-21 ENCOUNTER — Encounter: Payer: Self-pay | Admitting: Family Medicine

## 2017-12-21 VITALS — BP 116/70 | HR 76 | Ht 70.0 in | Wt 168.0 lb

## 2017-12-21 DIAGNOSIS — L03211 Cellulitis of face: Secondary | ICD-10-CM | POA: Diagnosis not present

## 2017-12-21 MED ORDER — DOXYCYCLINE HYCLATE 100 MG PO TABS: 100.0000 mg | ORAL_TABLET | Freq: Two times a day (BID) | ORAL | 0 refills | Status: DC

## 2017-12-21 NOTE — Progress Notes (Signed)
Subjective:    Patient ID: Nancy Parks, female    DOB: [birth year], 67 y.o.   MRN: [id number]  HPI She is having left eye rash, redness x 2 days. No trauma or injury.  No chemical exposure.  No changes in soaps, detergents, etc. Both eyelids feel swollen. No fever.  No crusting of the eyes.  Vision is OK.  Tried heat, frankincense oil, and some immune supplemental tabs.  Review of Systems  BP 116/70   Pulse 76   Ht 5' 10"  (1.778 m)   Wt 168 lb (76.2 kg)   SpO2 100%   BMI 24.11 kg/m     Allergies  Allergen Reactions  . Latex Rash    REACTION: Rash  REACTION: Rash  . Other Rash  . Tape Rash  . Fosamax [Alendronate Sodium] Other (See Comments)    GI upset    Past Medical History:  Diagnosis Date  . Depression   . Headache(784.0)    Migraines  . Leukopenia   . Leukopenia 07/13/2012  . Multiple myeloma, without mention of having achieved remission 07/13/2012    Past Surgical History:  Procedure Laterality Date  . BASAL CELL CARCINOMA EXCISION  2013   removed from nose  . CATARACT EXTRACTION  09/2007  . LAPAROSCOPIC ENDOMETRIOSIS FULGURATION  1979    Social History   Socioeconomic History  . Marital status: Divorced    Spouse name: Not on file  . Number of children: Not on file  . Years of education: Not on file  . Highest education level: Bachelor's degree (e.g., BA, AB, BS)  Occupational History  . 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 Occupation: Economist  Social Needs  . Financial resource strain: Not on file  . Food insecurity:    Worry: Not on file    Inability: Not on file  . Transportation needs:    Medical: Not on file    Non-medical: Not on file  Tobacco Use  . Smoking status: Never Smoker  . Smokeless tobacco: Never Used  . Tobacco comment: never used tobacco  Substance and Sexual Activity  . Alcohol use: No    Alcohol/week: 0.0 standard drinks  . Drug use: No  . Sexual activity: Yes    Partners: Male  Lifestyle  .  Physical activity:    Days per week: Not on file    Minutes per session: Not on file  . Stress: Not on file  Relationships  . Social connections:    Talks on phone: Not on file    Gets together: Not on file    Attends religious service: Not on file    Active member of club or organization: Not on file    Attends meetings of clubs or organizations: Not on file    Relationship status: Not on file  . Intimate partner violence:    Fear of current or ex partner: Not on file    Emotionally abused: Not on file    Physically abused: Not on file    Forced sexual activity: Not on file  Other Topics Concern  . Not on file  Social History Narrative   No exercise.  Doing 10 min of yoga.       Patient is right-handed. She lives alone in a 2 story home. She drinks 3 cups of coffee a day. Seh works out at [employer] 4 x week.    Family History  Problem Relation Age of Onset  . Breast cancer  Unknown        grandmother   . Colon cancer Father        colon adenoma  . Heart attack Father   . Hyperlipidemia Sister   . Hypertension Sister   . Cancer Sister        throat, smoker  . Melanoma Sister   . Diabetes Sister   . CVA Mother   . Diabetes Brother     Outpatient Encounter Medications as of 12/21/2017  Medication Sig  . AMBULATORY NON FORMULARY MEDICATION Take 750 mg by mouth 2 (two) times daily. Cura med-Curcumin  . AMBULATORY NON FORMULARY MEDICATION Medication Name: digestive enzymes  . AMBULATORY NON FORMULARY MEDICATION Medication Name: 5 herbal energy- after meals  . CALCIUM & MAGNESIUM CARBONATES PO Take 4 capsules by mouth daily.   . Cholecalciferol 5000 units capsule Take 5,000 Units by mouth daily.  Marland Kitchen doxycycline (VIBRA-TABS) 100 MG tablet Take 1 tablet (100 mg total) by mouth 2 (two) times daily.  . naproxen (NAPROSYN) 500 MG tablet Take 1 tablet with each sumatriptan as directed.  . nortriptyline (PAMELOR) 50 MG capsule Take 1 capsule (50 mg total) by mouth at bedtime.  Marland Kitchen  OVER THE COUNTER MEDICATION Take 500 mg by mouth daily. Triphala  . propranolol (INDERAL) 40 MG tablet Take 40 mg by mouth daily.  . SUMAtriptan (IMITREX) 100 MG tablet Take 100 mg by mouth as needed for migraine.  . vitamin B-12 (CYANOCOBALAMIN) 1000 MCG tablet Take 1,000 mcg by mouth daily. Increase to 2000 mcg if needed   No facility-administered encounter medications on file as of 12/21/2017.          Objective:   Physical Exam         Assessment & Plan:  Erythema and rash around the left eye -  Likely sialitis but unclear.  I do not see any specific break in the skin but it sounds like it has been rapidly spreading.  Crusting in the either she does have a lot of induration of the blood vessels on the lateral part of the sclera. Will tx with doxy. Call if fever or getting worse.  Call eye doc if any pain in the eye or vision change.

## 2018-01-11 NOTE — Progress Notes (Signed)
NEUROLOGY FOLLOW UP OFFICE NOTE  [name] [id number]  HISTORY OF PRESENT ILLNESS: Nancy Parks is a 67 year old right-handed female with depression, migraines, orthostatic hypotension, and history of multiple myeloma status post chemotherapy in remission since 07/2013 who follows up for migraines.  UPDATE: MRI of the brain with and without contrast from 10/17/2017 was personally reviewed and showed mild chronic small vessel ischemic changes but no acute abnormality.  Intensity:  severe Duration:  Severe headache starts to improve in 1 to 2 hours and hangover effect up to 24 hours. Frequency:  6 days over past 4 weeks. Frequency of abortive medication: 6 days over past 4 weeks Current NSAIDS: Naproxen 500 mg (with sumatriptan 100 mg) Current analgesics: None Current triptans: Sumatriptan 100 mg (with naproxen 500 mg) Current ergotamine:  none Current anti-emetic: None Current muscle relaxants: None Current anti-anxiolytic: None Current sleep aide: None Current Antihypertensive medications: Propranolol 40 mg in p.m. Current Antidepressant medications: Nortriptyline 50 mg at bedtime Current Anticonvulsant medications: None Current anti-CGRP: None Current Vitamins/Herbal/Supplements: None Current Antihistamines/Decongestants: None Other therapy: None  Caffeine: 3 cups of coffee daily Alcohol: No Smoker: No Diet: Hydrates Exercise: Works out at gym 4 times a week Depression: No; Anxiety: No Other pain: No Sleep hygiene: Good  She is still having problems with memory.  She got the address of the office incorrect.  She reports trouble with concentration and has trouble recalling what she read.  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    HISTORY: Onset: History of ocular migraines.  Formally diagnosed with migraines in 2010.  Started having memory deficits around that time as well.  They resolved in 2016.  Starting in December 2018 started having memory deficits,  fatigue. Location: right retro-orbital/right-occipital, right ear, right temple Quality:  Aching/tender, paresthesias on back of occipital region, sometimes stabbing pain in back of head Initial intensity:  10 (5/10 since starting propranolol).  She denies thunderclap headache or severe headache that wakes her from sleep. Aura:  See associated symptoms Associated symptoms: Memory deficits (forgot about a recent party she threw for a friend), language dysfunction/dyslexia (does not know meaning of certain words or write incorrect sentences, word-finding difficulties), phonophobia.  She denies associated unilateral numbness or weakness.  Except, she had one migraine with language disorder/dyslexia associated with difficulty walking and decreased movement on the right.. Initial duration:  12 to 24 hours Initial Frequency:  Daily (occasionally she may have up to 4 days in a row without one). Triggers: None Relieving factors:  none  Since around 2010, she has had memory deficits.  She also endorses episodes of language dysfunction and dyslexia.  Sometimes she doesn't know a meaning of a word or other times she writes the incorrect word.  This typically occurs with a migraine attack but occurs other times as well.  She had neurocognitive testing in June 2019.  Findings were not consistent with a neurodegenerative disease (cortical or subcortical dementia).  Her cognitive symptoms are believed to be migraine-related.  Past NSAIDS:  ibuprofen Past analgesics:  Excedrin Migraine, tramadol Past abortive triptans:  Treximet, sumatriptan 146m, rizatriptan 176mPast abortive ergotamine:  no Past muscle relaxants:  no Past anti-emetic:  no Past antihypertensive medications:  no Past antidepressant medications:  Amitriptyline 5066mcitalopram 12m51mst anticonvulsant medications:  no Past anti-CGRP:  no Past vitamins/Herbal/Supplements:  no Past antihistamines/decongestants:  no Other past therapies:   no  MRI of brain without contrast from 12/17/11 showed chronic small vessel ischemic changes but no acute  abnormality. CTA of head and neck from 12/20/08 was normal.  PAST MEDICAL HISTORY: Past Medical History:  Diagnosis Date  . Depression   . Headache(784.0)    Migraines  . Leukopenia   . Leukopenia 07/13/2012  . Multiple myeloma, without mention of having achieved remission 07/13/2012    MEDICATIONS: Current Outpatient Medications on File Prior to Visit  Medication Sig Dispense Refill  . AMBULATORY NON FORMULARY MEDICATION Take 750 mg by mouth 2 (two) times daily. Cura med-Curcumin    . AMBULATORY NON FORMULARY MEDICATION Medication Name: digestive enzymes    . AMBULATORY NON FORMULARY MEDICATION Medication Name: 5 herbal energy- after meals    . CALCIUM & MAGNESIUM CARBONATES PO Take 4 capsules by mouth daily.     . Cholecalciferol 5000 units capsule Take 5,000 Units by mouth daily.    Marland Kitchen doxycycline (VIBRA-TABS) 100 MG tablet Take 1 tablet (100 mg total) by mouth 2 (two) times daily. 14 tablet 0  . naproxen (NAPROSYN) 500 MG tablet Take 1 tablet with each sumatriptan as directed. 16 tablet 3  . nortriptyline (PAMELOR) 50 MG capsule Take 1 capsule (50 mg total) by mouth at bedtime. 30 capsule 3  . OVER THE COUNTER MEDICATION Take 500 mg by mouth daily. Triphala    . propranolol (INDERAL) 40 MG tablet Take 40 mg by mouth daily.    . SUMAtriptan (IMITREX) 100 MG tablet Take 100 mg by mouth as needed for migraine.    . vitamin B-12 (CYANOCOBALAMIN) 1000 MCG tablet Take 1,000 mcg by mouth daily. Increase to 2000 mcg if needed     No current facility-administered medications on file prior to visit.     ALLERGIES: Allergies  Allergen Reactions  . Latex Rash    REACTION: Rash  REACTION: Rash  . Other Rash  . Tape Rash  . Fosamax [Alendronate Sodium] Other (See Comments)    GI upset    FAMILY HISTORY: Family History  Problem Relation Age of Onset  . Breast cancer Unknown         grandmother   . Colon cancer Father        colon adenoma  . Heart attack Father   . Hyperlipidemia Sister   . Hypertension Sister   . Cancer Sister        throat, smoker  . Melanoma Sister   . Diabetes Sister   . CVA Mother   . Diabetes Brother     SOCIAL HISTORY: Social History   Socioeconomic History  . Marital status: Divorced    Spouse name: Not on file  . Number of children: Not on file  . Years of education: Not on file  . Highest education level: Bachelor's degree (e.g., BA, AB, BS)  Occupational History  . 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 Financial resource strain: Not on file  . Food insecurity:    Worry: Not on file    Inability: Not on file  . Transportation needs:    Medical: Not on file    Non-medical: Not on file  Tobacco Use  . Smoking status: Never Smoker  . Smokeless tobacco: Never Used  . Tobacco comment: never used tobacco  Substance and Sexual Activity  . Alcohol use: No    Alcohol/week: 0.0 standard drinks  . Drug use: No  . Sexual activity: Yes    Partners: Male  Lifestyle  . Physical activity:    Days per week: Not on  file    Minutes per session: Not on file  . Stress: Not on file  Relationships  . Social connections:    Talks on phone: Not on file    Gets together: Not on file    Attends religious service: Not on file    Active member of club or organization: Not on file    Attends meetings of clubs or organizations: Not on file    Relationship status: Not on file  . Intimate partner violence:    Fear of current or ex partner: Not on file    Emotionally abused: Not on file    Physically abused: Not on file    Forced sexual activity: Not on file  Other Topics Concern  . Not on file  Social History Narrative   No exercise.  Doing 10 min of yoga.       Patient is right-handed. She lives alone in a 2 story home. She drinks 3 cups of coffee a day. Seh works out at  [employer] 4 x week.    REVIEW OF SYSTEMS: Constitutional: No fevers, chills, or sweats, no generalized fatigue, change in appetite Eyes: No visual changes, double vision, eye pain Ear, nose and throat: No hearing loss, ear pain, nasal congestion, sore throat Cardiovascular: No chest pain, palpitations Respiratory:  No shortness of breath at rest or with exertion, wheezes GastrointestinaI: No nausea, vomiting, diarrhea, abdominal pain, fecal incontinence Genitourinary:  No dysuria, urinary retention or frequency Musculoskeletal:  No neck pain, back pain Integumentary: No rash, pruritus, skin lesions Neurological: as above Psychiatric: No depression, insomnia, anxiety Endocrine: No palpitations, fatigue, diaphoresis, mood swings, change in appetite, change in weight, increased thirst Hematologic/Lymphatic:  No purpura, petechiae. Allergic/Immunologic: no itchy/runny eyes, nasal congestion, recent allergic reactions, rashes  PHYSICAL EXAM: Blood pressure 128/80, pulse 87, height 5' 10"  (1.778 m), weight 175 lb (79.4 kg), SpO2 97 %. General: No acute distress.  Patient appears well-groomed.  Head:  Normocephalic/atraumatic Eyes:  Fundi examined but not visualized Neck: supple, no paraspinal tenderness, full range of motion Heart:  Regular rate and rhythm Lungs:  Clear to auscultation bilaterally Back: No paraspinal tenderness Neurological Exam: alert and oriented to person, place, and time. Attention span and concentration intact, recent and remote memory intact, fund of knowledge intact.  Speech fluent and not dysarthric, language intact.  CN II-XII intact. Bulk and tone normal, muscle strength 5/5 throughout.  Sensation to light touch  intact.  Deep tendon reflexes 2+ throughout.  Finger to nose and heel to shin testing intact.  Gait normal, Romberg negative.  IMPRESSION: 1.  Migraine with aura, without status migrainosus, not intractable 2.  Memory deficits.  Deficits still persist  despite improvement in headaches.  I question impression from her previous neurocognitive testing.  PLAN: 1.  We will try to achieve further better migraine control by increasing nortriptyline to 75 mg at bedtime.  She will contact me in 4 weeks with an update and we could increase to 100 mg at bedtime if needed. 2.  At the same time, we will taper off of propranolol. 3.  She will continue sumatriptan with naproxen as needed for abortive therapy, limited to no more than 2 days out of the week to prevent rebound headache. 4.  Continue headache diary 5.  Follow-up in 3 to 4 months  Metta Clines, DO  CC: [name], MD

## 2018-01-12 ENCOUNTER — Encounter: Payer: Self-pay | Admitting: Neurology

## 2018-01-12 ENCOUNTER — Ambulatory Visit: Payer: Medicare HMO | Admitting: Neurology

## 2018-01-12 VITALS — BP 128/80 | HR 87 | Ht 70.0 in | Wt 175.0 lb

## 2018-01-12 DIAGNOSIS — G43109 Migraine with aura, not intractable, without status migrainosus: Secondary | ICD-10-CM

## 2018-01-12 DIAGNOSIS — R413 Other amnesia: Secondary | ICD-10-CM

## 2018-01-12 MED ORDER — PROPRANOLOL HCL 20 MG PO TABS: 20.0000 mg | ORAL_TABLET | Freq: Two times a day (BID) | ORAL | 0 refills | Status: DC

## 2018-01-12 MED ORDER — NORTRIPTYLINE HCL 75 MG PO CAPS: 75.0000 mg | ORAL_CAPSULE | Freq: Every day | ORAL | 3 refills | Status: DC

## 2018-01-12 NOTE — Patient Instructions (Signed)
1.  Increase nortriptyline to 75mg  at bedtime.  If needed, we can increase dose in 4 weeks (contact us) 2.  Take propranolol 20mg  at bedtime for 2 weeks, then stop 3.  I will refer you to [facility]. 4.  Use sumatriptan 100mg  with naproxen 500mg  as needed.  Limit use of pain relievers to no more than 2 days out of week to prevent risk of rebound or medication-overuse headache. 5.  Keep headache diary 6.  Follow up in 4 months.

## 2018-01-13 NOTE — Addendum Note (Signed)
Addended byAnnamaria Helling on: 01/13/2018 11:37 AM   Modules accepted: Orders

## 2018-01-19 DIAGNOSIS — R69 Illness, unspecified: Secondary | ICD-10-CM | POA: Diagnosis not present

## 2018-01-21 ENCOUNTER — Telehealth: Payer: Self-pay | Admitting: Neurology

## 2018-01-21 NOTE — Telephone Encounter (Signed)
Patient is calling in stating that she called [facility] and they had not rec the referral that was supposed to be sent. Please fax this over to Fax: [phone]. Thanks!

## 2018-01-22 NOTE — Telephone Encounter (Signed)
Have faxed numerous times, if still will not go through, will mail

## 2018-01-27 NOTE — Telephone Encounter (Signed)
Rcvd fax from [place], [place] has initial appt 01/28/18. Also requesting last 3 office notes, will fax

## 2018-01-28 ENCOUNTER — Telehealth: Payer: Self-pay

## 2018-01-28 DIAGNOSIS — R41841 Cognitive communication deficit: Secondary | ICD-10-CM | POA: Diagnosis not present

## 2018-01-28 DIAGNOSIS — R51 Headache: Secondary | ICD-10-CM | POA: Diagnosis not present

## 2018-01-28 DIAGNOSIS — R413 Other amnesia: Secondary | ICD-10-CM | POA: Diagnosis not present

## 2018-01-28 DIAGNOSIS — R4582 Worries: Secondary | ICD-10-CM | POA: Diagnosis not present

## 2018-01-28 NOTE — Telephone Encounter (Signed)
Rcvd call from Dr. Leonides Schanz with [facility]. She was requesting previous neuropsych testing from 07/2017, faxing

## 2018-02-01 DIAGNOSIS — R51 Headache: Secondary | ICD-10-CM | POA: Diagnosis not present

## 2018-02-01 DIAGNOSIS — R413 Other amnesia: Secondary | ICD-10-CM | POA: Diagnosis not present

## 2018-02-01 DIAGNOSIS — R4582 Worries: Secondary | ICD-10-CM | POA: Diagnosis not present

## 2018-02-01 DIAGNOSIS — R41841 Cognitive communication deficit: Secondary | ICD-10-CM | POA: Diagnosis not present

## 2018-02-16 ENCOUNTER — Telehealth: Payer: Self-pay | Admitting: Neurology

## 2018-02-16 MED ORDER — SUMATRIPTAN SUCCINATE 100 MG PO TABS: 100.0000 mg | ORAL_TABLET | Freq: Once | ORAL | 2 refills | Status: DC | PRN

## 2018-02-16 NOTE — Telephone Encounter (Signed)
Called and advised Pt we had not rcvd request for refill. She stated her previous Dr filled the sumatriptan in [facility], but was establishing care with [name]. I advise her I will send in Rx.

## 2018-02-16 NOTE — Telephone Encounter (Signed)
Patient wants to talk to someone about why her migraine medication was denied. Please call her back  She uses the [facility] in [place]

## 2018-03-02 DIAGNOSIS — G43109 Migraine with aura, not intractable, without status migrainosus: Secondary | ICD-10-CM | POA: Diagnosis not present

## 2018-03-02 DIAGNOSIS — I951 Orthostatic hypotension: Secondary | ICD-10-CM | POA: Diagnosis not present

## 2018-03-02 DIAGNOSIS — C9000 Multiple myeloma not having achieved remission: Secondary | ICD-10-CM | POA: Diagnosis not present

## 2018-03-02 DIAGNOSIS — F419 Anxiety disorder, unspecified: Secondary | ICD-10-CM | POA: Diagnosis not present

## 2018-03-02 DIAGNOSIS — I679 Cerebrovascular disease, unspecified: Secondary | ICD-10-CM | POA: Diagnosis not present

## 2018-03-02 DIAGNOSIS — F330 Major depressive disorder, recurrent, mild: Secondary | ICD-10-CM | POA: Diagnosis not present

## 2018-03-02 DIAGNOSIS — F09 Unspecified mental disorder due to known physiological condition: Secondary | ICD-10-CM | POA: Diagnosis not present

## 2018-03-08 ENCOUNTER — Inpatient Hospital Stay: Payer: Medicare HMO | Attending: Hematology & Oncology | Admitting: Hematology & Oncology

## 2018-03-08 ENCOUNTER — Other Ambulatory Visit: Payer: Self-pay

## 2018-03-08 ENCOUNTER — Encounter: Payer: Self-pay | Admitting: Hematology & Oncology

## 2018-03-08 ENCOUNTER — Inpatient Hospital Stay: Payer: Medicare HMO

## 2018-03-08 VITALS — BP 137/77 | HR 87 | Temp 98.1°F | Resp 18 | Wt 178.0 lb

## 2018-03-08 DIAGNOSIS — C9000 Multiple myeloma not having achieved remission: Secondary | ICD-10-CM | POA: Diagnosis not present

## 2018-03-08 DIAGNOSIS — D708 Other neutropenia: Secondary | ICD-10-CM

## 2018-03-08 DIAGNOSIS — D472 Monoclonal gammopathy: Secondary | ICD-10-CM

## 2018-03-08 DIAGNOSIS — D72819 Decreased white blood cell count, unspecified: Secondary | ICD-10-CM | POA: Diagnosis not present

## 2018-03-08 DIAGNOSIS — I951 Orthostatic hypotension: Secondary | ICD-10-CM | POA: Diagnosis not present

## 2018-03-08 HISTORY — DX: Multiple myeloma not having achieved remission: C90.00

## 2018-03-08 HISTORY — DX: Monoclonal gammopathy: D47.2

## 2018-03-08 LAB — CBC WITH DIFFERENTIAL (CANCER CENTER ONLY)
Abs Immature Granulocytes: 0.01 10*3/uL (ref 0.00–0.07)
Basophils Absolute: 0 10*3/uL (ref 0.0–0.1)
Basophils Relative: 1 %
Eosinophils Absolute: 0 10*3/uL (ref 0.0–0.5)
Eosinophils Relative: 1 %
HCT: 38.6 % (ref 36.0–46.0)
Hemoglobin: 12.7 g/dL (ref 12.0–15.0)
Immature Granulocytes: 1 %
Lymphocytes Relative: 59 %
Lymphs Abs: 1.2 10*3/uL (ref 0.7–4.0)
MCH: 32 pg (ref 26.0–34.0)
MCHC: 32.9 g/dL (ref 30.0–36.0)
MCV: 97.2 fL (ref 80.0–100.0)
Monocytes Absolute: 0.1 10*3/uL (ref 0.1–1.0)
Monocytes Relative: 7 %
Neutro Abs: 0.6 10*3/uL — ABNORMAL LOW (ref 1.7–7.7)
Neutrophils Relative %: 31 %
Platelet Count: 291 10*3/uL (ref 150–400)
RBC: 3.97 MIL/uL (ref 3.87–5.11)
RDW: 13.2 % (ref 11.5–15.5)
WBC Count: 2 10*3/uL — ABNORMAL LOW (ref 4.0–10.5)
nRBC: 0 % (ref 0.0–0.2)

## 2018-03-08 LAB — SAVE SMEAR

## 2018-03-08 NOTE — Progress Notes (Signed)
Hematology and Oncology Follow Up Visit  Charleen Madera [id number] 04-11-1950 68 y.o. 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  Schaafsma is back for followup.  Unfortunately, it looks like she is having some issues with headaches.  She been having these migraines.  She has seen a neurologist.  She is seen a cardiologist.  She has this orthostatic hypotension.  She is on nortriptyline for this now.  She apparently saw some neuropsychologist down in [place].  This lady said that the myeloma was part of the problem.  Not sure exactly what that means.  When we last saw her in May 2019, her M spike was only 1 g/dL.  Her IgG level was 1356 mg/dL.  Her lambda light chain was 1.3 mg/dL.  All this has been holding relatively steady.  She is still working part-time.  She does enjoy work.  She has had no foreign travel.  She has had no issues with nausea or vomiting.  She has had no change in bowel or bladder habits.  Overall, her performance status is ECOG 1.     Medications:  Current Outpatient Medications:  .  AMBULATORY NON FORMULARY MEDICATION, Take 750 mg by mouth 2 (two) times daily. Cura med-Curcumin, Disp: , Rfl:  .  AMBULATORY NON FORMULARY MEDICATION, Medication Name: digestive enzymes, Disp: , Rfl:  .  AMBULATORY NON FORMULARY MEDICATION, Medication Name: 5 herbal energy- after meals, Disp: , Rfl:  .  CALCIUM & MAGNESIUM CARBONATES PO, Take 4 capsules by mouth daily. , Disp: , Rfl:  .  Cholecalciferol 5000 units capsule, Take 5,000 Units by mouth daily., Disp: , Rfl:  .  naproxen (NAPROSYN) 500 MG tablet, Take 1 tablet with each sumatriptan as directed., Disp: 16 tablet, Rfl: 3 .  nortriptyline (PAMELOR) 75 MG capsule, Take 1 capsule (75 mg total) by mouth at bedtime., Disp: 30 capsule, Rfl: 3 .  OVER THE COUNTER MEDICATION, Take 500 mg by  mouth daily. Triphala, Disp: , Rfl:  .  SUMAtriptan (IMITREX) 100 MG tablet, Take 100 mg by mouth as needed for migraine., Disp: , Rfl:  .  vitamin B-12 (CYANOCOBALAMIN) 1000 MCG tablet, Take 1,000 mcg by mouth daily. Increase to 2000 mcg if needed, Disp: , Rfl:   Allergies:  Allergies  Allergen Reactions  . Latex Rash    REACTION: Rash  REACTION: Rash  . Other Rash  . Tape Rash  . Fosamax [Alendronate Sodium] Other (See Comments)    GI upset    Past Medical History, Surgical history, Social history, and Family History were reviewed and updated.  Review of Systems: Review of Systems  Constitutional: Negative.   HENT: Negative.   Eyes: Negative.   Respiratory: Negative.   Cardiovascular: Negative.   Gastrointestinal: Negative.   Genitourinary: Negative.   Musculoskeletal: Negative.   Skin: Negative.   Neurological: Positive for headaches.  Endo/Heme/Allergies: Negative.   Psychiatric/Behavioral: Negative.      Physical Exam:  weight is 178 lb (80.7 kg). Her oral temperature is 98.1 F (36.7 C). Her blood pressure is 137/77 and her pulse is 87. Her respiration is 18 and oxygen saturation is 100%.   Physical Exam Vitals signs reviewed.  HENT:     Head: Normocephalic and atraumatic.  Eyes:     Pupils: Pupils are equal, round, and reactive to light.  Neck:  Musculoskeletal: Normal range of motion.  Cardiovascular:     Rate and Rhythm: Normal rate and regular rhythm.     Heart sounds: Normal heart sounds.  Pulmonary:     Effort: Pulmonary effort is normal.     Breath sounds: Normal breath sounds.  Abdominal:     General: Bowel sounds are normal.     Palpations: Abdomen is soft.  Musculoskeletal: Normal range of motion.        General: No tenderness or deformity.  Lymphadenopathy:     Cervical: No cervical adenopathy.  Skin:    General: Skin is warm and dry.     Findings: No erythema or rash.  Neurological:     Mental Status: She is alert and oriented to  person, place, and time.  Psychiatric:        Behavior: Behavior normal.        Thought Content: Thought content normal.        Judgment: Judgment normal.      Lab Results  Component Value Date   WBC 2.0 (L) 03/08/2018   HGB 12.7 03/08/2018   HCT 38.6 03/08/2018   MCV 97.2 03/08/2018   PLT 291 03/08/2018     Chemistry      Component Value Date/Time   NA 137 10/28/2017 1016   NA 141 02/29/2016 1318   K 4.6 10/28/2017 1016   K 3.9 02/29/2016 1318   CL 102 10/28/2017 1016   CL 103 02/28/2015 1523   CL 104 01/19/2014 0908   CO2 28 10/28/2017 1016   CO2 28 02/29/2016 1318   BUN 19 10/28/2017 1016   BUN 10.7 02/29/2016 1318   CREATININE 0.95 10/28/2017 1016   CREATININE 0.9 02/29/2016 1318      Component Value Date/Time   CALCIUM 9.2 10/28/2017 1016   CALCIUM 9.5 02/29/2016 1318   ALKPHOS 110 07/06/2017 1031   ALKPHOS 147 02/29/2016 1318   AST 16 10/28/2017 1016   AST 21 07/06/2017 1031   AST 17 02/29/2016 1318   ALT 15 10/28/2017 1016   ALT 22 07/06/2017 1031   ALT 15 02/29/2016 1318   BILITOT 0.6 10/28/2017 1016   BILITOT 0.6 07/06/2017 1031   BILITOT 0.38 02/29/2016 1318       Impression and Plan: Ms. Eckmann is 68 year old white female. She has a IgG lambda smoldering myeloma.  We will have to see what her M spike is. So far, it has been holding relatively stable.  Her leukopenia is holding steady.  I told her that if there is any thought that myeloma was a problem, we could certainly treat her.  She is not that enthusiastic.  I am not that enthusiastic.  I will plan to have her come back to see [name] in another 4 months or so.  Hopefully, she will be doing a little bit better.  [name], MD 1/20/202010:40 AM

## 2018-03-09 LAB — KAPPA/LAMBDA LIGHT CHAINS
KAPPA FREE LGHT CHN: 10.3 mg/L (ref 3.3–19.4)
Kappa, lambda light chain ratio: 0.84 (ref 0.26–1.65)
LAMDA FREE LIGHT CHAINS: 12.3 mg/L (ref 5.7–26.3)

## 2018-03-09 LAB — IGG, IGA, IGM
IGA: 42 mg/dL — AB (ref 87–352)
IgG (Immunoglobin G), Serum: 1359 mg/dL (ref 700–1600)
IgM (Immunoglobulin M), Srm: 81 mg/dL (ref 26–217)

## 2018-03-09 LAB — PROTEIN ELECTROPHORESIS, SERUM
A/G Ratio: 1.3 (ref 0.7–1.7)
Albumin ELP: 3.9 g/dL (ref 2.9–4.4)
Alpha-1-Globulin: 0.2 g/dL (ref 0.0–0.4)
Alpha-2-Globulin: 0.6 g/dL (ref 0.4–1.0)
Beta Globulin: 0.9 g/dL (ref 0.7–1.3)
Gamma Globulin: 1.4 g/dL (ref 0.4–1.8)
Globulin, Total: 3 g/dL (ref 2.2–3.9)
M-Spike, %: 1 g/dL — ABNORMAL HIGH
Total Protein ELP: 6.9 g/dL (ref 6.0–8.5)

## 2018-03-11 ENCOUNTER — Encounter: Payer: Self-pay | Admitting: Family Medicine

## 2018-03-11 ENCOUNTER — Ambulatory Visit (INDEPENDENT_AMBULATORY_CARE_PROVIDER_SITE_OTHER): Payer: Medicare HMO | Admitting: Family Medicine

## 2018-03-11 VITALS — BP 131/75 | HR 88 | Ht 70.0 in | Wt 168.0 lb

## 2018-03-11 DIAGNOSIS — R413 Other amnesia: Secondary | ICD-10-CM

## 2018-03-11 DIAGNOSIS — R69 Illness, unspecified: Secondary | ICD-10-CM | POA: Diagnosis not present

## 2018-03-11 DIAGNOSIS — F331 Major depressive disorder, recurrent, moderate: Secondary | ICD-10-CM | POA: Diagnosis not present

## 2018-03-11 DIAGNOSIS — F418 Other specified anxiety disorders: Secondary | ICD-10-CM | POA: Diagnosis not present

## 2018-03-11 DIAGNOSIS — G43109 Migraine with aura, not intractable, without status migrainosus: Secondary | ICD-10-CM

## 2018-03-11 MED ORDER — FLUOXETINE HCL 10 MG PO CAPS: 10.0000 mg | ORAL_CAPSULE | Freq: Every day | ORAL | 0 refills | Status: DC

## 2018-03-11 NOTE — Progress Notes (Signed)
Subjective:    Patient ID: Nancy Parks, female    DOB: [birth year], 68 y.o.   MRN: [id number]  HPI Is a 68 year old female is here today to follow-up for couple of different concerns.  She has a history of depression, anxiety, migraine headaches and history of multiple myeloma that was treated with chemotherapy and is now in remission back in 2015.  We had recently referred her to neurology.  They did do a brain MRI on October 17, 2017 showing just some chronic small vessel ischemic changes but no acute abnormalities which was reassuring.  She been having severe and frequent headaches.  Also felt like she had had a decline in cognitive functioning.  She noticed that she was having a more time difficult time with word finding.  He has been sleeping better overall.  She bought a weighted blanket and says actually has made a big difference in her sleep quality.  She was on citalopram after her divorce and was on that for a couple of years.  Her all her migraine headaches have been getting less frequent.  Over the month of December she only had 6 headaches versus 20 to the prior months.  She says they have also been lasting about 12 hours instead of 3 days so she does feel like overall she is getting better control.  She is also now using a combination of naproxen and Imitrex for rescue and that seems to be more effective.  Review of Systems  BP 131/75   Pulse 88   Ht 5' 10"  (1.778 m)   Wt 168 lb (76.2 kg)   SpO2 100%   BMI 24.11 kg/m     Allergies  Allergen Reactions  . Latex Rash    REACTION: Rash  REACTION: Rash  . Other Rash  . Tape Rash  . Fosamax [Alendronate Sodium] Other (See Comments)    GI upset    Past Medical History:  Diagnosis Date  . Depression   . Headache(784.0)    Migraines  . Leukopenia   . Leukopenia 07/13/2012  . Multiple myeloma, without mention of having achieved remission 07/13/2012  . Smoldering multiple myeloma ([place]) 03/08/2018    Past Surgical History:   Procedure Laterality Date  . BASAL CELL CARCINOMA EXCISION  2013   removed from nose  . CATARACT EXTRACTION  09/2007  . LAPAROSCOPIC ENDOMETRIOSIS FULGURATION  1979    Social History   Socioeconomic History  . Marital status: Divorced    Spouse name: Not on file  . Number of children: Not on file  . Years of education: Not on file  . Highest education level: Bachelor's degree (e.g., BA, AB, BS)  Occupational History  . Occupation: Aeronautical engineer: [employer]  . Occupation: Economist  Social Needs  . Financial resource strain: Not on file  . Food insecurity:    Worry: Not on file    Inability: Not on file  . Transportation needs:    Medical: Not on file    Non-medical: Not on file  Tobacco Use  . Smoking status: Never Smoker  . Smokeless tobacco: Never Used  . Tobacco comment: never used tobacco  Substance and Sexual Activity  . Alcohol use: No    Alcohol/week: 0.0 standard drinks  . Drug use: No  . Sexual activity: Yes    Partners: Male  Lifestyle  . Physical activity:    Days per week: Not on file    Minutes per session: Not on file  .  Stress: Not on file  Relationships  . Social connections:    Talks on phone: Not on file    Gets together: Not on file    Attends religious service: Not on file    Active member of club or organization: Not on file    Attends meetings of clubs or organizations: Not on file    Relationship status: Not on file  . Intimate partner violence:    Fear of current or ex partner: Not on file    Emotionally abused: Not on file    Physically abused: Not on file    Forced sexual activity: Not on file  Other Topics Concern  . Not on file  Social History Narrative   No exercise.  Doing 10 min of yoga.       Patient is right-handed. She lives alone in a 2 story home. She drinks 3 cups of coffee a day. Seh works out at [employer] 4 x week.    Family History  Problem Relation Age of Onset  . Breast cancer Unknown         grandmother   . Colon cancer Father        colon adenoma  . Heart attack Father   . Hyperlipidemia Sister   . Hypertension Sister   . Cancer Sister        throat, smoker  . Melanoma Sister   . Diabetes Sister   . CVA Mother   . Diabetes Brother     Outpatient Encounter Medications as of 03/11/2018  Medication Sig  . AMBULATORY NON FORMULARY MEDICATION Take 750 mg by mouth 2 (two) times daily. Cura med-Curcumin  . AMBULATORY NON FORMULARY MEDICATION Medication Name: digestive enzymes  . AMBULATORY NON FORMULARY MEDICATION Medication Name: 5 herbal energy- after meals  . CALCIUM & MAGNESIUM CARBONATES PO Take 4 capsules by mouth daily.   . Cholecalciferol 5000 units capsule Take 5,000 Units by mouth daily.  . naproxen (NAPROSYN) 500 MG tablet Take 1 tablet with each sumatriptan as directed.  . nortriptyline (PAMELOR) 75 MG capsule Take 1 capsule (75 mg total) by mouth at bedtime.  Marland Kitchen OVER THE COUNTER MEDICATION Take 500 mg by mouth daily. Triphala  . SUMAtriptan (IMITREX) 100 MG tablet Take 100 mg by mouth as needed for migraine.  . vitamin B-12 (CYANOCOBALAMIN) 1000 MCG tablet Take 1,000 mcg by mouth daily. Increase to 2000 mcg if needed  . FLUoxetine (PROZAC) 10 MG capsule Take 1 capsule (10 mg total) by mouth daily.   No facility-administered encounter medications on file as of 03/11/2018.          Objective:   Physical Exam Vitals signs reviewed.  Constitutional:      Appearance: She is well-developed.  HENT:     Head: Normocephalic and atraumatic.  Eyes:     Conjunctiva/sclera: Conjunctivae normal.  Cardiovascular:     Rate and Rhythm: Normal rate.  Pulmonary:     Effort: Pulmonary effort is normal.  Skin:    General: Skin is dry.     Coloration: Skin is not pale.  Neurological:     Mental Status: She is alert and oriented to person, place, and time.  Psychiatric:        Behavior: Behavior normal.           Assessment & Plan:   Migraine with  aura-improving.  Following with Dr. Tomi Likens at neurology.  They are less frequent and not lasting as long.  Continue nortriptyline at bedtime.  Memory deficits-she was also referred for neurocognitive testing.  Make several recommendations including repeat testing in about 12 months.  They also recommended treating her chronic underlying depression and anxiety.  She actually felt like things were better but says looking back in retrospect she feels like she probably has had some depression that probably needs to be treated.  Also continue to work on getting good quality sleep.  Also recommended a referral to a therapist for cognitive behavioral therapy.  Thus we did discuss options.  We will start her on a low-dose of fluoxetine.  Discussed that we will start with a low dose and then adjust pending on how she is doing.  We will follow-up in 3 to 4 weeks.  PHQ 9 score of 11 and gad 7 score of 6.  Rates her symptoms is somewhat difficult.  We will monitor this carefully.  We will also go ahead and place referral.  I think she would do great with [facility] in [place].  We will need to dose make sure that she takes her insurance she is now on Medicare.  I made some additional recommendations for testing neurology such as pain be a sleep study etc.  Major depressive disorder with concomitant anxiety- Thus we did discuss options.  We will start her on a low-dose of fluoxetine.  Discussed that we will start with a low dose and then adjust pending on how she is doing.  We will follow-up in 3 to 4 weeks.  PHQ 9 score of 11 and gad 7 score of 6.  Rates her symptoms is somewhat difficult.  We will monitor this carefully.  We will also go ahead and place referral.  I think she would do great with [facility] in [place].  We will need to dose make sure that she takes her insurance she is now on Medicare.

## 2018-04-01 ENCOUNTER — Ambulatory Visit (INDEPENDENT_AMBULATORY_CARE_PROVIDER_SITE_OTHER): Payer: Medicare HMO | Admitting: Family Medicine

## 2018-04-01 ENCOUNTER — Ambulatory Visit (INDEPENDENT_AMBULATORY_CARE_PROVIDER_SITE_OTHER): Payer: Medicare HMO | Admitting: Psychology

## 2018-04-01 ENCOUNTER — Encounter: Payer: Self-pay | Admitting: Family Medicine

## 2018-04-01 VITALS — BP 133/66 | HR 87 | Ht 70.0 in | Wt 168.0 lb

## 2018-04-01 DIAGNOSIS — R413 Other amnesia: Secondary | ICD-10-CM | POA: Diagnosis not present

## 2018-04-01 DIAGNOSIS — F418 Other specified anxiety disorders: Secondary | ICD-10-CM

## 2018-04-01 DIAGNOSIS — G43109 Migraine with aura, not intractable, without status migrainosus: Secondary | ICD-10-CM

## 2018-04-01 DIAGNOSIS — F419 Anxiety disorder, unspecified: Secondary | ICD-10-CM

## 2018-04-01 DIAGNOSIS — F331 Major depressive disorder, recurrent, moderate: Secondary | ICD-10-CM | POA: Diagnosis not present

## 2018-04-01 DIAGNOSIS — R69 Illness, unspecified: Secondary | ICD-10-CM | POA: Diagnosis not present

## 2018-04-01 MED ORDER — FLUOXETINE HCL 20 MG PO CAPS: 20.0000 mg | ORAL_CAPSULE | Freq: Every day | ORAL | 1 refills | Status: DC

## 2018-04-01 NOTE — Progress Notes (Signed)
Subjective:    CC: 3 week f/U new start mood medication.   HPI:  F/U  Major depressive disorder with concomitant anxiety- she reports that she doesn't "feel as dark" since starting the new medication pt c/o having more head pain and mirgraine headaches, she had 2 migraines last wk and 1 this wk.  She is not sure if the medication is triggering these or not.  She is seeing the therapist today for first visit.    Still struggling with memory issues.  Planning for f/u with Neuropsych in 1 yr.    Past medical history, Surgical history, Family history not pertinant except as noted below, Social history, Allergies, and medications have been entered into the medical record, reviewed, and corrections made.   Review of Systems: No fevers, chills, night sweats, weight loss, chest pain, or shortness of breath.   Objective:    General: Well Developed, well nourished, and in no acute distress.  Neuro: Alert and oriented x3, extra-ocular muscles intact, sensation grossly intact.  HEENT: Normocephalic, atraumatic  Skin: Warm and dry, no rashes. Cardiac: Regular rate and rhythm, no murmurs rubs or gallops, no lower extremity edema.  Respiratory: Clear to auscultation bilaterally. Not using accessory muscles, speaking in full sentences.   Impression and Recommendations:   Major depressive disorder with concomitant anxiety- discussed options. Not sure if med increasing her HA or if related to the recent weather change. She opted to stick with the medication ad will try increasing dose. IF HA worsen then she will call us back and we can change medication.  Keep appt with therapist today.  PHQ-9 score of 13 andf GAD 7 score of 7.  No major change in mood.   Migraine HA - Continue Pamelor and PRN imitrex.  Call if worsening on the fluoxetine.    Memory impairment.

## 2018-04-06 ENCOUNTER — Other Ambulatory Visit: Payer: Self-pay | Admitting: Family Medicine

## 2018-04-08 ENCOUNTER — Ambulatory Visit (INDEPENDENT_AMBULATORY_CARE_PROVIDER_SITE_OTHER): Payer: Medicare HMO | Admitting: Psychology

## 2018-04-08 DIAGNOSIS — F419 Anxiety disorder, unspecified: Secondary | ICD-10-CM | POA: Diagnosis not present

## 2018-04-08 DIAGNOSIS — R69 Illness, unspecified: Secondary | ICD-10-CM | POA: Diagnosis not present

## 2018-04-08 DIAGNOSIS — F331 Major depressive disorder, recurrent, moderate: Secondary | ICD-10-CM

## 2018-04-09 ENCOUNTER — Encounter: Payer: Self-pay | Admitting: *Deleted

## 2018-04-15 ENCOUNTER — Ambulatory Visit (INDEPENDENT_AMBULATORY_CARE_PROVIDER_SITE_OTHER): Payer: Medicare HMO | Admitting: Psychology

## 2018-04-15 DIAGNOSIS — F331 Major depressive disorder, recurrent, moderate: Secondary | ICD-10-CM | POA: Diagnosis not present

## 2018-04-15 DIAGNOSIS — F419 Anxiety disorder, unspecified: Secondary | ICD-10-CM | POA: Diagnosis not present

## 2018-04-15 DIAGNOSIS — R69 Illness, unspecified: Secondary | ICD-10-CM | POA: Diagnosis not present

## 2018-04-20 DIAGNOSIS — M542 Cervicalgia: Secondary | ICD-10-CM | POA: Diagnosis not present

## 2018-04-20 DIAGNOSIS — M9901 Segmental and somatic dysfunction of cervical region: Secondary | ICD-10-CM | POA: Diagnosis not present

## 2018-04-20 DIAGNOSIS — M9903 Segmental and somatic dysfunction of lumbar region: Secondary | ICD-10-CM | POA: Diagnosis not present

## 2018-04-20 DIAGNOSIS — M5411 Radiculopathy, occipito-atlanto-axial region: Secondary | ICD-10-CM | POA: Diagnosis not present

## 2018-04-22 ENCOUNTER — Ambulatory Visit (INDEPENDENT_AMBULATORY_CARE_PROVIDER_SITE_OTHER): Payer: Medicare HMO | Admitting: Psychology

## 2018-04-22 DIAGNOSIS — F419 Anxiety disorder, unspecified: Secondary | ICD-10-CM | POA: Diagnosis not present

## 2018-04-22 DIAGNOSIS — R69 Illness, unspecified: Secondary | ICD-10-CM | POA: Diagnosis not present

## 2018-04-22 DIAGNOSIS — F331 Major depressive disorder, recurrent, moderate: Secondary | ICD-10-CM

## 2018-04-27 DIAGNOSIS — M9903 Segmental and somatic dysfunction of lumbar region: Secondary | ICD-10-CM | POA: Diagnosis not present

## 2018-04-27 DIAGNOSIS — M5411 Radiculopathy, occipito-atlanto-axial region: Secondary | ICD-10-CM | POA: Diagnosis not present

## 2018-04-27 DIAGNOSIS — M9901 Segmental and somatic dysfunction of cervical region: Secondary | ICD-10-CM | POA: Diagnosis not present

## 2018-04-27 DIAGNOSIS — M542 Cervicalgia: Secondary | ICD-10-CM | POA: Diagnosis not present

## 2018-04-28 ENCOUNTER — Ambulatory Visit (INDEPENDENT_AMBULATORY_CARE_PROVIDER_SITE_OTHER): Payer: Medicare HMO | Admitting: Family Medicine

## 2018-04-28 ENCOUNTER — Other Ambulatory Visit: Payer: Self-pay

## 2018-04-28 ENCOUNTER — Encounter: Payer: Self-pay | Admitting: Family Medicine

## 2018-04-28 VITALS — BP 130/70 | HR 88 | Ht 70.0 in | Wt 186.0 lb

## 2018-04-28 DIAGNOSIS — R69 Illness, unspecified: Secondary | ICD-10-CM | POA: Diagnosis not present

## 2018-04-28 DIAGNOSIS — G43109 Migraine with aura, not intractable, without status migrainosus: Secondary | ICD-10-CM | POA: Diagnosis not present

## 2018-04-28 DIAGNOSIS — F418 Other specified anxiety disorders: Secondary | ICD-10-CM

## 2018-04-28 MED ORDER — FLUOXETINE HCL 40 MG PO CAPS: 40.0000 mg | ORAL_CAPSULE | Freq: Every day | ORAL | 1 refills | Status: DC

## 2018-04-28 NOTE — Progress Notes (Signed)
Subjective:    CC: 4 wk f/u for mood  HPI:  68 year old female is here today for 4-week follow-up for major depressive disorder with concomitant anxiety.  We had started her on fluoxetine and she was noticing some improvement in her symptoms but had also been experiencing more headaches so I had asked her to come back in a month so that we could just reassess and see if the headaches were improving and it could be medication related or not.  She is also started seeing Janett Billow the therapist here in our office.  Believe she has gone for couple of appointments thus far. She does feel like the medication has been some helpful but not a dramatic difference.  She did go out with some friends last week they commented that she seemed better but still was not quite herself.  She is also started exercising again and is using an app called [employer] to help get herself motivated.  She admits that she has been eating a lot of sugar to self medicate.  She is currently on fluoxetine 20 mg daily.  She reports that her sleep is fair.  She is been getting about 6 to 7 hours per night.  Today she has a migraine headache.  She did come in.  She did take something this morning for it because it was throbbing.  Past medical history, Surgical history, Family history not pertinant except as noted below, Social history, Allergies, and medications have been entered into the medical record, reviewed, and corrections made.   Review of Systems: No fevers, chills, night sweats, weight loss, chest pain, or shortness of breath.   Objective:    General: Well Developed, well nourished, and in no acute distress.  Neuro: Alert and oriented x3, extra-ocular muscles intact, sensation grossly intact.  HEENT: Normocephalic, atraumatic  Skin: Warm and dry, no rashes. Cardiac: Regular rate and rhythm, no murmurs rubs or gallops, no lower extremity edema.  Respiratory: Clear to auscultation bilaterally. Not using accessory muscles,  speaking in full sentences.   Impression and Recommendations:    Major depressive disorder/anxiety -discussed options.  For now will increase fluoxetine to 40 mg a like to give that a try for a few weeks and see if it seems to be more therapeutic.  Her PHQ 9 score was 11 today and gad 7 score of 3 and 3 points were completely for irritability.  Continue with therapy/counseling.  Migraine headaches -acute episode today.  Offered to do a Toradol injection but patient declined and said she just can go home and rest today.

## 2018-05-06 ENCOUNTER — Other Ambulatory Visit: Payer: Self-pay

## 2018-05-06 ENCOUNTER — Ambulatory Visit (INDEPENDENT_AMBULATORY_CARE_PROVIDER_SITE_OTHER): Payer: Medicare HMO | Admitting: Psychology

## 2018-05-06 DIAGNOSIS — F331 Major depressive disorder, recurrent, moderate: Secondary | ICD-10-CM | POA: Diagnosis not present

## 2018-05-06 DIAGNOSIS — F419 Anxiety disorder, unspecified: Secondary | ICD-10-CM | POA: Diagnosis not present

## 2018-05-06 DIAGNOSIS — R69 Illness, unspecified: Secondary | ICD-10-CM | POA: Diagnosis not present

## 2018-05-11 DIAGNOSIS — M9901 Segmental and somatic dysfunction of cervical region: Secondary | ICD-10-CM | POA: Diagnosis not present

## 2018-05-11 DIAGNOSIS — M542 Cervicalgia: Secondary | ICD-10-CM | POA: Diagnosis not present

## 2018-05-11 DIAGNOSIS — M9903 Segmental and somatic dysfunction of lumbar region: Secondary | ICD-10-CM | POA: Diagnosis not present

## 2018-05-11 DIAGNOSIS — M5411 Radiculopathy, occipito-atlanto-axial region: Secondary | ICD-10-CM | POA: Diagnosis not present

## 2018-05-13 ENCOUNTER — Telehealth: Payer: Self-pay | Admitting: *Deleted

## 2018-05-13 ENCOUNTER — Ambulatory Visit (INDEPENDENT_AMBULATORY_CARE_PROVIDER_SITE_OTHER): Payer: Medicare HMO | Admitting: Psychology

## 2018-05-13 DIAGNOSIS — F331 Major depressive disorder, recurrent, moderate: Secondary | ICD-10-CM | POA: Diagnosis not present

## 2018-05-13 DIAGNOSIS — F419 Anxiety disorder, unspecified: Secondary | ICD-10-CM

## 2018-05-13 DIAGNOSIS — R69 Illness, unspecified: Secondary | ICD-10-CM | POA: Diagnosis not present

## 2018-05-13 NOTE — Telephone Encounter (Signed)
error 

## 2018-05-20 ENCOUNTER — Ambulatory Visit: Payer: Medicare HMO | Admitting: Psychology

## 2018-05-21 ENCOUNTER — Ambulatory Visit: Payer: Medicare HMO | Admitting: Psychology

## 2018-05-21 ENCOUNTER — Ambulatory Visit (INDEPENDENT_AMBULATORY_CARE_PROVIDER_SITE_OTHER): Payer: Medicare HMO | Admitting: Psychology

## 2018-05-21 DIAGNOSIS — F419 Anxiety disorder, unspecified: Secondary | ICD-10-CM | POA: Diagnosis not present

## 2018-05-21 DIAGNOSIS — R69 Illness, unspecified: Secondary | ICD-10-CM | POA: Diagnosis not present

## 2018-05-21 DIAGNOSIS — F331 Major depressive disorder, recurrent, moderate: Secondary | ICD-10-CM | POA: Diagnosis not present

## 2018-05-22 ENCOUNTER — Other Ambulatory Visit: Payer: Self-pay | Admitting: Neurology

## 2018-05-25 ENCOUNTER — Ambulatory Visit: Payer: Self-pay | Admitting: Neurology

## 2018-05-26 DIAGNOSIS — M542 Cervicalgia: Secondary | ICD-10-CM | POA: Diagnosis not present

## 2018-05-26 DIAGNOSIS — M5411 Radiculopathy, occipito-atlanto-axial region: Secondary | ICD-10-CM | POA: Diagnosis not present

## 2018-05-26 DIAGNOSIS — M9901 Segmental and somatic dysfunction of cervical region: Secondary | ICD-10-CM | POA: Diagnosis not present

## 2018-05-26 DIAGNOSIS — M9903 Segmental and somatic dysfunction of lumbar region: Secondary | ICD-10-CM | POA: Diagnosis not present

## 2018-05-28 ENCOUNTER — Ambulatory Visit (INDEPENDENT_AMBULATORY_CARE_PROVIDER_SITE_OTHER): Payer: Medicare HMO | Admitting: Psychology

## 2018-05-28 DIAGNOSIS — F331 Major depressive disorder, recurrent, moderate: Secondary | ICD-10-CM | POA: Diagnosis not present

## 2018-05-28 DIAGNOSIS — F419 Anxiety disorder, unspecified: Secondary | ICD-10-CM | POA: Diagnosis not present

## 2018-05-28 DIAGNOSIS — R69 Illness, unspecified: Secondary | ICD-10-CM | POA: Diagnosis not present

## 2018-06-03 ENCOUNTER — Ambulatory Visit: Payer: Medicare HMO | Admitting: Psychology

## 2018-06-04 ENCOUNTER — Ambulatory Visit (INDEPENDENT_AMBULATORY_CARE_PROVIDER_SITE_OTHER): Payer: Medicare HMO | Admitting: Psychology

## 2018-06-04 ENCOUNTER — Telehealth: Payer: Self-pay | Admitting: Cardiology

## 2018-06-04 DIAGNOSIS — R69 Illness, unspecified: Secondary | ICD-10-CM | POA: Diagnosis not present

## 2018-06-04 DIAGNOSIS — F331 Major depressive disorder, recurrent, moderate: Secondary | ICD-10-CM

## 2018-06-04 DIAGNOSIS — F419 Anxiety disorder, unspecified: Secondary | ICD-10-CM | POA: Diagnosis not present

## 2018-06-04 NOTE — Telephone Encounter (Signed)
Virtual Visit Pre-Appointment Phone Call  Steps For Call:  1. Confirm consent - "In the setting of the current Covid19 crisis, you are scheduled for a (phone or video) visit with your provider on (date) at (time).  Just as we do with many in-office visits, in order for you to participate in this visit, we must obtain consent.  If you'd like, I can send this to your mychart (if signed up) or email for you to review.  Otherwise, I can obtain your verbal consent now.  All virtual visits are billed to your insurance company just like a normal visit would be.  By agreeing to a virtual visit, we'd like you to understand that the technology does not allow for your provider to perform an examination, and thus may limit your provider's ability to fully assess your condition. If your provider identifies any concerns that need to be evaluated in person, we will make arrangements to do so.  Finally, though the technology is pretty good, we cannot assure that it will always work on either your or our end, and in the setting of a video visit, we may have to convert it to a phone-only visit.  In either situation, we cannot ensure that we have a secure connection.  Are you willing to proceed?" STAFF: Did the patient verbally acknowledge consent to telehealth visit? Document YES/NO here: YES  2. Confirm the BEST phone number to call the day of the visit by including in appointment notes  3. Give patient instructions for WebEx/MyChart download to smartphone as below or Doximity/Doxy.me if video visit (depending on what platform provider is using)  4. Advise patient to be prepared with their blood pressure, heart rate, weight, any heart rhythm information, their current medicines, and a piece of paper and pen handy for any instructions they may receive the day of their visit  5. Inform patient they will receive a phone call 15 minutes prior to their appointment time (may be from unknown caller ID) so they should be  prepared to answer  6. Confirm that appointment type is correct in Epic appointment notes (VIDEO vs PHONE)     TELEPHONE CALL NOTE  Rashaun Curl has been deemed a candidate for a follow-up tele-health visit to limit community exposure during the Covid-19 pandemic. I spoke with the patient via phone to ensure availability of phone/video source, confirm preferred email & phone number, and discuss instructions and expectations.  I reminded Zaida Reiland to be prepared with any vital sign and/or heart rhythm information that could potentially be obtained via home monitoring, at the time of her visit. I reminded Aveyah Greenwood to expect a phone call at the time of her visit if her visit.  Isaiah Blakes 06/04/2018 2:10 PM   INSTRUCTIONS FOR DOWNLOADING THE [facility] APP TO SMARTPHONE  - If Apple, ask patient to go to [employer] and type in WebEx in the search bar. [place] [employer], the blue/green circle. If Android, go to [employer] and type in [employer] in the search bar. The app is free but as with any other app downloads, their phone may require them to verify saved payment information or Apple/Android password.  - The patient does NOT have to create an account. - On the day of the visit, the assist will walk the patient through joining the meeting with the meeting number/password.  INSTRUCTIONS FOR DOWNLOADING THE MYCHART APP TO SMARTPHONE  - The patient must first make sure to have  activated MyChart and know their login information - If Apple, go to [employer] and type in MyChart in the search bar and download the app. If Android, ask patient to go to [employer] and type in [place] in the search bar and download the app. The app is free but as with any other app downloads, their phone may require them to verify saved payment information or Apple/Android password.  - The patient will need to then log into the app with their MyChart username and password, and select [facility]  as their healthcare provider to link the account. When it is time for your visit, go to the MyChart app, find appointments, and click Begin Video Visit. Be sure to Select Allow for your device to access the Microphone and Camera for your visit. You will then be connected, and your provider will be with you shortly.  **If they have any issues connecting, or need assistance please contact MyChart service desk [phone])**  **If using a computer, in order to ensure the best quality for their visit they will need to use either of the following Internet Browsers: [employer], or Google Chrome**  IF USING DOXIMITY or DOXY.ME - The patient will receive a link just prior to their visit, either by text or email (to be determined day of appointment depending on if it's doxy.me or Doximity).     FULL LENGTH CONSENT FOR TELE-HEALTH VISIT   I hereby voluntarily request, consent and authorize [facility] and its employed or contracted physicians, physician assistants, nurse practitioners or other licensed health care professionals (the Practitioner), to provide me with telemedicine health care services (the Services") as deemed necessary by the treating Practitioner. I acknowledge and consent to receive the Services by the Practitioner via telemedicine. I understand that the telemedicine visit will involve communicating with the Practitioner through live audiovisual communication technology and the disclosure of certain medical information by electronic transmission. I acknowledge that I have been given the opportunity to request an in-person assessment or other available alternative prior to the telemedicine visit and am voluntarily participating in the telemedicine visit.  I understand that I have the right to withhold or withdraw my consent to the use of telemedicine in the course of my care at any time, without affecting my right to future care or treatment, and that the Practitioner or  I may terminate the telemedicine visit at any time. I understand that I have the right to inspect all information obtained and/or recorded in the course of the telemedicine visit and may receive copies of available information for a reasonable fee.  I understand that some of the potential risks of receiving the Services via telemedicine include:   Delay or interruption in medical evaluation due to technological equipment failure or disruption;  Information transmitted may not be sufficient (e.g. poor resolution of images) to allow for appropriate medical decision making by the Practitioner; and/or   In rare instances, security protocols could fail, causing a breach of personal health information.  Furthermore, I acknowledge that it is my responsibility to provide information about my medical history, conditions and care that is complete and accurate to the best of my ability. I acknowledge that Practitioner's advice, recommendations, and/or decision may be based on factors not within their control, such as incomplete or inaccurate data provided by me or distortions of diagnostic images or specimens that may result from electronic transmissions. I understand that the practice of medicine is not an exact science and that  Practitioner makes no warranties or guarantees regarding treatment outcomes. I acknowledge that I will receive a copy of this consent concurrently upon execution via email to the email address I last provided but may also request a printed copy by calling the office of [facility].    I understand that my insurance will be billed for this visit.   I have read or had this consent read to me.  I understand the contents of this consent, which adequately explains the benefits and risks of the Services being provided via telemedicine.   I have been provided ample opportunity to ask questions regarding this consent and the Services and have had my questions answered to my  satisfaction.  I give my informed consent for the services to be provided through the use of telemedicine in my medical care  By participating in this telemedicine visit I agree to the above.

## 2018-06-09 ENCOUNTER — Encounter: Payer: Self-pay | Admitting: Family Medicine

## 2018-06-09 ENCOUNTER — Ambulatory Visit (INDEPENDENT_AMBULATORY_CARE_PROVIDER_SITE_OTHER): Payer: Medicare HMO | Admitting: Family Medicine

## 2018-06-09 VITALS — BP 142/84 | HR 96 | Ht 70.0 in | Wt 185.0 lb

## 2018-06-09 DIAGNOSIS — M542 Cervicalgia: Secondary | ICD-10-CM | POA: Diagnosis not present

## 2018-06-09 DIAGNOSIS — M6281 Muscle weakness (generalized): Secondary | ICD-10-CM

## 2018-06-09 DIAGNOSIS — R69 Illness, unspecified: Secondary | ICD-10-CM | POA: Diagnosis not present

## 2018-06-09 DIAGNOSIS — R443 Hallucinations, unspecified: Secondary | ICD-10-CM | POA: Diagnosis not present

## 2018-06-09 DIAGNOSIS — F331 Major depressive disorder, recurrent, moderate: Secondary | ICD-10-CM

## 2018-06-09 DIAGNOSIS — M5411 Radiculopathy, occipito-atlanto-axial region: Secondary | ICD-10-CM | POA: Diagnosis not present

## 2018-06-09 DIAGNOSIS — M9901 Segmental and somatic dysfunction of cervical region: Secondary | ICD-10-CM | POA: Diagnosis not present

## 2018-06-09 DIAGNOSIS — M9903 Segmental and somatic dysfunction of lumbar region: Secondary | ICD-10-CM | POA: Diagnosis not present

## 2018-06-09 MED ORDER — FLUOXETINE HCL 10 MG PO CAPS: ORAL_CAPSULE | ORAL | 0 refills | Status: DC

## 2018-06-09 NOTE — Progress Notes (Signed)
Pt reports that the more that she takes the daytime meds the worse she feels and she has hallucinations. She also remembers seeing a cat and feeling like she was scratched but when she looked there wasn't a scratch.   She said that the counseling has really help it has given her some clarity w/this.Nancy Parks, [place]

## 2018-06-09 NOTE — Progress Notes (Signed)
Virtual Visit via Video Note  I connected with Nancy Parks on 06/09/18 at  8:30 AM EDT by a video enabled telemedicine application and verified that I am speaking with the correct person using two identifiers.   I discussed the limitations of evaluation and management by telemedicine and the availability of in person appointments. The patient expressed understanding and agreed to proceed.  Patient was in her home and I was in my office for this visit.  Subjective:    CC:   HPI: She has been having some hallucination over the last couple of months ( started on March 8th) and hs never had this before. She says they are often in the morning. Started with seeing shadows in the corner of her eyes.  For example mostly have been visual but now starting to get some auditory hallucinations.  Saw a "person" in her bed" this morning.  She has been having a lot more migraines than usual.    Had 6 migraines this month so far. Last month had about 5 migraines.   Feels like she has lost her coordination.  Says even her friends have noticed.  She doesn't feel dizzy. No numbness or paresthesias.  In regards to her depression she has been working with Janett Billow her therapist who had actually reached out to me about some of the concerns with the hallucinations.  This has been very helpful for her.  She has had some thoughts of being better off dead but says she is not actively suicidal and does feel safe to be home.   Past medical history, Surgical history, Family history not pertinant except as noted below, Social history, Allergies, and medications have been entered into the medical record, reviewed, and corrections made.   Review of Systems: No fevers, chills, night sweats, weight loss, chest pain, or shortness of breath.   Objective:    General: Speaking clearly in complete sentences without any shortness of breath.  Alert and oriented x3.  Normal judgment. No apparent acute distress.well groomed.       Impression and Recommendations:    Migraines - have ramped up.  I definitely think in correlation with hallucinations and discoordination that she could have something pathologic going on.  I like to move forward with an MRI of the brain for further work-up.  She has never had problems with hallucinations before even with her issues with depression and anxiety over the years.  This is definitely very unusual.  Hallucinations - Has appt with Dr. Tomi Likens in early June.  Could be a medication side effects are working to go ahead and wean her off of fluoxetine.  We will drop her down to 30 mg for 1 week then 20 mg for 1 week and then 10 mg for 1 week and then stop.  Hopefully the symptoms will improve we are also can move forward with MRI as above.  I want her to rule out any pathologic lesions or MS etc.  Feeling weak - without exam hard to see if has true weak. She fells lack coordinator.  She does have a follow-up appointment with Dr. Tomi Likens her neurologist in June which is perfect so depending on how her symptoms change progress or improve I would like his input and helping to figure out what is going on with her.  Major depression-continue therapy sessions with Janett Billow.  Infectious a follow-up appointment tomorrow.     I discussed the assessment and treatment plan with the patient. The patient was provided an  opportunity to ask questions and all were answered. The patient agreed with the plan and demonstrated an understanding of the instructions.   The patient was advised to call back or seek an in-person evaluation if the symptoms worsen or if the condition fails to improve as anticipated.   [name], MD

## 2018-06-10 ENCOUNTER — Ambulatory Visit: Payer: Medicare HMO | Admitting: Psychology

## 2018-06-10 ENCOUNTER — Telehealth: Payer: Self-pay | Admitting: Cardiology

## 2018-06-10 ENCOUNTER — Other Ambulatory Visit: Payer: Self-pay

## 2018-06-10 ENCOUNTER — Encounter: Payer: Self-pay | Admitting: Cardiology

## 2018-06-10 ENCOUNTER — Other Ambulatory Visit: Payer: Self-pay | Admitting: Family Medicine

## 2018-06-10 ENCOUNTER — Telehealth (INDEPENDENT_AMBULATORY_CARE_PROVIDER_SITE_OTHER): Payer: Medicare HMO | Admitting: Cardiology

## 2018-06-10 VITALS — BP 141/84 | HR 92 | Temp 96.6°F | Wt 185.0 lb

## 2018-06-10 DIAGNOSIS — R443 Hallucinations, unspecified: Secondary | ICD-10-CM

## 2018-06-10 DIAGNOSIS — I951 Orthostatic hypotension: Secondary | ICD-10-CM

## 2018-06-10 DIAGNOSIS — M6281 Muscle weakness (generalized): Secondary | ICD-10-CM | POA: Diagnosis not present

## 2018-06-10 DIAGNOSIS — G43109 Migraine with aura, not intractable, without status migrainosus: Secondary | ICD-10-CM

## 2018-06-10 DIAGNOSIS — R5383 Other fatigue: Secondary | ICD-10-CM

## 2018-06-10 LAB — COMPLETE METABOLIC PANEL WITH GFR
AG Ratio: 1.6 (calc) (ref 1.0–2.5)
ALT: 15 U/L (ref 6–29)
AST: 15 U/L (ref 10–35)
Albumin: 4.2 g/dL (ref 3.6–5.1)
Alkaline phosphatase (APISO): 102 U/L (ref 37–153)
BUN/Creatinine Ratio: 28 (calc) — ABNORMAL HIGH (ref 6–22)
BUN: 26 mg/dL — ABNORMAL HIGH (ref 7–25)
CO2: 30 mmol/L (ref 20–32)
Calcium: 9.4 mg/dL (ref 8.6–10.4)
Chloride: 102 mmol/L (ref 98–110)
Creat: 0.93 mg/dL (ref 0.50–0.99)
GFR, Est African American: 73 mL/min/{1.73_m2} (ref >=60)
GFR, Est Non African American: 63 mL/min/{1.73_m2} (ref >=60)
Globulin: 2.6 g/dL (calc) (ref 1.9–3.7)
Glucose, Bld: 92 mg/dL (ref 65–99)
Potassium: 4.4 mmol/L (ref 3.5–5.3)
Sodium: 138 mmol/L (ref 135–146)
Total Bilirubin: 0.3 mg/dL (ref 0.2–1.2)
Total Protein: 6.8 g/dL (ref 6.1–8.1)

## 2018-06-10 NOTE — Progress Notes (Signed)
Lab ordered per radiology protocol.  

## 2018-06-10 NOTE — Addendum Note (Signed)
Addended by: Lavon Paganini on: 06/10/2018 09:26 AM   Modules accepted: Orders

## 2018-06-10 NOTE — Progress Notes (Signed)
Virtual Visit via Video Note   This visit type was conducted due to national recommendations for restrictions regarding the COVID-19 Pandemic (e.g. social distancing) in an effort to limit this patient's exposure and mitigate transmission in our community.  Due to her co-morbid illnesses, this patient is at least at moderate risk for complications without adequate follow up.  This format is felt to be most appropriate for this patient at this time.  All issues noted in this document were discussed and addressed.  A limited physical exam was performed with this format.  Please refer to the patient's chart for her consent to telehealth for [facility]  Evaluation Performed:  Follow-up visit  This visit type was conducted due to national recommendations for restrictions regarding the COVID-19 Pandemic (e.g. social distancing).  This format is felt to be most appropriate for this patient at this time.  All issues noted in this document were discussed and addressed.  No physical exam was performed (except for noted visual exam findings with Video Visits).  Please refer to the patient's chart (MyChart message for video visits and phone note for telephone visits) for the patient's consent to telehealth for [facility].  Date:  06/10/2018  ID: Nancy Parks, DOB 26-May-1950, MRN [id number]   Patient Location: [address] [place] [address]   Provider location:   [facility]  PCP:  Hali Marry, MD  Cardiologist:  Jenne Campus, MD     Chief Complaint: Doing well from orthostatic hypotension point of view  History of Present Illness:    Nancy Parks is a 68 y.o. female  who presents via audio/video conferencing for a telehealth visit today.  With orthostatic hypotension, also migraine headaches, depression.  Recently she started having more migraines and depression seems to be getting worse she also started having some neurological symptoms felt to be  related to Prozac.  Recently Prozac has been cut down with intention to discontinue.  My understanding also is there is some referrals being made for psychiatry appointment which I think is a reasonable approach.  We spent more than 20 minutes today talk about her issues luckily her orthostatic hypotension appears to be under control and does not bother her much.  She does not feel down she does not have dizziness when she gets up.  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  And does the reason why her Prozac has been cut down.  We spoke a lot about her current situation with coronavirus she does have anxiety she also has depression.  Which she thinks it is slowly gradually getting worse.  She had appointment with her family physician yesterday with nice conversation.   The patient does not have symptoms concerning for COVID-19 infection (fever, chills, cough, or new SHORTNESS OF BREATH).    Prior CV studies:   The following studies were reviewed today:       Past Medical History:  Diagnosis Date  . Depression   . Headache(784.0)    Migraines  . Leukopenia   . Leukopenia 07/13/2012  . Multiple myeloma, without mention of having achieved remission 07/13/2012  . 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 BASAL CELL CARCINOMA EXCISION  2013   removed from nose  . CATARACT EXTRACTION  09/2007  . [place]  Current Meds  Medication Sig  . AMBULATORY NON FORMULARY MEDICATION Medication Name: digestive enzymes  . CALCIUM & MAGNESIUM CARBONATES PO Take 4 capsules by mouth daily.   . Cholecalciferol 5000 units capsule Take 5,000 Units by mouth daily.  Marland Kitchen FLUoxetine (PROZAC) 10 MG capsule 3 cap po QD x 7 days, then 2 caps QD x 7 days, 1 cap po QD x 7 days, then stop.  . naproxen (NAPROSYN) 500 MG tablet Take 1 tablet with each  sumatriptan as directed.  . nortriptyline (PAMELOR) 75 MG capsule TAKE 1 CAPSULE BY MOUTH AT BEDTIME  . SUMAtriptan (IMITREX) 100 MG tablet Take 100 mg by mouth as needed for migraine.  . vitamin B-12 (CYANOCOBALAMIN) 1000 MCG tablet Take 1,000 mcg by mouth daily. Increase to 2000 mcg if needed      Family History: The patient's family history includes Breast cancer in her unknown relative; CVA in her mother; Cancer in her sister; Colon cancer in her father; Diabetes in her brother and sister; Heart attack in her father; Hyperlipidemia in her sister; Hypertension in her sister; Melanoma in her sister.   ROS:   Please see the history of present illness.     All other systems reviewed and are negative.   Labs/Other Tests and Data Reviewed:     Recent Labs: 10/28/2017: ALT 15; BUN 19; Creat 0.95; Potassium 4.6; Sodium 137 03/08/2018: Hemoglobin 12.7; Platelet Count 291  Recent Lipid Panel    Component Value Date/Time   CHOL 225 (H) 10/28/2017 1016   TRIG 110 10/28/2017 1016   HDL 60 10/28/2017 1016   CHOLHDL 3.8 10/28/2017 1016   VLDL 26 10/01/2015 1037   LDLCALC 142 (H) 10/28/2017 1016      Exam:    Vital Signs:  BP (!) 141/84   Pulse 92   Temp (!) 96.6 F (35.9 C)   Wt 185 lb (83.9 kg)   BMI 26.54 kg/m     Wt Readings from Last 3 Encounters:  06/10/18 185 lb (83.9 kg)  06/09/18 185 lb (83.9 kg)  04/28/18 186 lb (84.4 kg)     Well nourished, well developed in no acute distress. He sitting in her living room talking to me and happy to be able to talk.  Treated for an acting appropriately.  Diagnosis for this visit:   1. Orthostatic hypotension   2. Migraine with aura and without status migrainosus, not intractable   3. Fatigue, unspecified type      ASSESSMENT & PLAN:    1.  Orthostatic hypotension.  She is asymptomatic seems to be doing well from that point review. 2.  Migraine headaches apparently more frequent now her beta-blocker has been discontinued.   We waiting for effect of this.  Also her Prozac has been lowered with intention to discontinue. 3.  Fatigue.  Very complex situation.  Clearly depression play some role here.  That being addressed already by primary care physician.  COVID-19 Education: The signs and symptoms of COVID-19 were discussed with the patient and how to seek care for testing (follow up with PCP or arrange E-visit).  The importance of social distancing was discussed today.  Patient Risk:   After full review of this patients clinical status, I feel that they are at least moderate risk at this time.  Time:   Today, I have spent 22 minutes with the patient with telehealth technology discussing pt health issues.  I spent 24mnutes reviewing her chart before the visit.  Visit was finished  at 2:18 PM.    Medication Adjustments/Labs and Tests Ordered: Current medicines are reviewed at length with the patient today.  Concerns regarding medicines are outlined above.  No orders of the defined types were placed in this encounter.  Medication changes: No orders of the defined types were placed in this encounter.    Disposition: Follow-up in 3 months  Signed, Park Liter, MD, [facility] 06/10/2018 2:19 PM    [place]

## 2018-06-10 NOTE — Telephone Encounter (Signed)

## 2018-06-10 NOTE — Patient Instructions (Signed)

## 2018-06-10 NOTE — Progress Notes (Signed)
MRI order updated to ASAP per Provider request

## 2018-06-11 ENCOUNTER — Ambulatory Visit (INDEPENDENT_AMBULATORY_CARE_PROVIDER_SITE_OTHER): Payer: Medicare HMO | Admitting: Psychology

## 2018-06-11 DIAGNOSIS — F419 Anxiety disorder, unspecified: Secondary | ICD-10-CM | POA: Diagnosis not present

## 2018-06-11 DIAGNOSIS — R69 Illness, unspecified: Secondary | ICD-10-CM | POA: Diagnosis not present

## 2018-06-11 DIAGNOSIS — F331 Major depressive disorder, recurrent, moderate: Secondary | ICD-10-CM | POA: Diagnosis not present

## 2018-06-14 ENCOUNTER — Ambulatory Visit (INDEPENDENT_AMBULATORY_CARE_PROVIDER_SITE_OTHER): Payer: Medicare HMO

## 2018-06-14 ENCOUNTER — Other Ambulatory Visit: Payer: Self-pay

## 2018-06-14 DIAGNOSIS — M6281 Muscle weakness (generalized): Secondary | ICD-10-CM

## 2018-06-14 DIAGNOSIS — R443 Hallucinations, unspecified: Secondary | ICD-10-CM

## 2018-06-14 DIAGNOSIS — G43909 Migraine, unspecified, not intractable, without status migrainosus: Secondary | ICD-10-CM | POA: Diagnosis not present

## 2018-06-14 MED ORDER — GADOBENATE DIMEGLUMINE 529 MG/ML IV SOLN
20.0000 mL | Freq: Once | INTRAVENOUS | Status: AC | PRN
  Administered 2018-06-14: 17 mL via INTRAVENOUS

## 2018-06-15 ENCOUNTER — Encounter: Payer: Self-pay | Admitting: Family Medicine

## 2018-06-15 ENCOUNTER — Ambulatory Visit (INDEPENDENT_AMBULATORY_CARE_PROVIDER_SITE_OTHER): Payer: Medicare HMO | Admitting: Family Medicine

## 2018-06-15 VITALS — BP 144/93 | HR 89 | Temp 97.4°F | Ht 70.0 in | Wt 185.0 lb

## 2018-06-15 DIAGNOSIS — R51 Headache: Secondary | ICD-10-CM

## 2018-06-15 DIAGNOSIS — R443 Hallucinations, unspecified: Secondary | ICD-10-CM

## 2018-06-15 DIAGNOSIS — R519 Headache, unspecified: Secondary | ICD-10-CM

## 2018-06-15 DIAGNOSIS — G8929 Other chronic pain: Secondary | ICD-10-CM | POA: Diagnosis not present

## 2018-06-15 DIAGNOSIS — R69 Illness, unspecified: Secondary | ICD-10-CM | POA: Diagnosis not present

## 2018-06-15 DIAGNOSIS — F331 Major depressive disorder, recurrent, moderate: Secondary | ICD-10-CM | POA: Diagnosis not present

## 2018-06-15 MED ORDER — FLUOXETINE HCL 10 MG PO CAPS: ORAL_CAPSULE | ORAL | 0 refills | Status: AC

## 2018-06-15 NOTE — Progress Notes (Signed)
Pt is more anxious about her memory. She is also been having a HA at the back R side of side of her head behind her ear. She was unable to sleep last night.Nancy Parks, [place]

## 2018-06-15 NOTE — Progress Notes (Signed)
Virtual Visit via Video Note  I connected with Nancy Parks on 06/15/18 at  1:40 PM EDT by a video enabled telemedicine application and verified that I am speaking with the correct person using two identifiers.   I discussed the limitations of evaluation and management by telemedicine and the availability of in person appointments. The patient expressed understanding and agreed to proceed.  Pt was at home and I was in my office for the virtual visit.     Subjective:    CC: go over MRI results.   HPI: 68 yo female wants to go over her MRI results.   Pt is more anxious about her memory. She is also been having a HA at the back R side of side of her head behind her ear. She was unable to sleep last night. Advil migraine isn't help it. She is hydrating well.    She wasn't able to get the lower dose of the fluoxetine. The pharmacy told her that it required authorization from the pharmacy.  Still getting confused and having some hallucinations.  So she is still taking fluoxetine 40mg .     Past medical history, Surgical history, Family history not pertinant except as noted below, Social history, Allergies, and medications have been entered into the medical record, reviewed, and corrections made.   Review of Systems: No fevers, chills, night sweats, weight loss, chest pain, or shortness of breath.   Objective:    General: Speaking clearly in complete sentences without any shortness of breath.  Alert and oriented x3.  Normal judgment. No apparent acute distress.    Impression and Recommendations:    Right posterior HA - MRI was normal. It was reassuring.    Chronic HA - continue nortriptiline.  Will wean the fluoxetine.    Hallucinations - I really want to get he off the fluoxetine.  Will check on PA. In the meantime I checked [facility] and it is $8.06.  Will resent to the pharmacy if not better after taper then will consult with Dr. Tomi Likens.     I discussed the assessment and treatment  plan with the patient. The patient was provided an opportunity to ask questions and all were answered. The patient agreed with the plan and demonstrated an understanding of the instructions.   The patient was advised to call back or seek an in-person evaluation if the symptoms worsen or if the condition fails to improve as anticipated.   [name], MD

## 2018-06-16 ENCOUNTER — Ambulatory Visit (INDEPENDENT_AMBULATORY_CARE_PROVIDER_SITE_OTHER): Payer: Medicare HMO | Admitting: Family Medicine

## 2018-06-16 ENCOUNTER — Telehealth: Payer: Self-pay | Admitting: Family Medicine

## 2018-06-16 ENCOUNTER — Encounter: Payer: Self-pay | Admitting: Family Medicine

## 2018-06-16 VITALS — BP 146/75 | HR 101 | Temp 99.7°F

## 2018-06-16 DIAGNOSIS — R509 Fever, unspecified: Secondary | ICD-10-CM | POA: Diagnosis not present

## 2018-06-16 DIAGNOSIS — R51 Headache: Secondary | ICD-10-CM | POA: Diagnosis not present

## 2018-06-16 DIAGNOSIS — R110 Nausea: Secondary | ICD-10-CM

## 2018-06-16 DIAGNOSIS — H9201 Otalgia, right ear: Secondary | ICD-10-CM

## 2018-06-16 DIAGNOSIS — R519 Headache, unspecified: Secondary | ICD-10-CM

## 2018-06-16 MED ORDER — PROMETHAZINE HCL 25 MG PO TABS: 25.0000 mg | ORAL_TABLET | Freq: Three times a day (TID) | ORAL | 0 refills | Status: AC | PRN

## 2018-06-16 MED ORDER — AMOXICILLIN-POT CLAVULANATE 875-125 MG PO TABS: 1.0000 | ORAL_TABLET | Freq: Two times a day (BID) | ORAL | 0 refills | Status: AC

## 2018-06-16 NOTE — Progress Notes (Signed)
Virtual Visit via Video Note  I connected with Nancy Parks on 06/16/18 at  3:00 PM EDT by a video enabled telemedicine application and verified that I am speaking with the correct person using two identifiers.   I discussed the limitations of evaluation and management by telemedicine and the availability of in person appointments. The patient expressed understanding and agreed to proceed.  Pt was at home and I was in my office for the virtual visit.     Subjective:    CC: Headache and FEver.   HPI: He has been experiencing some headaches but it suddenly got worse today..  Felt hot and thirsty checked her temperature and she was running a low-grade fever.. No nasal congestion. Head feels sensitive. HIghest temp was 99.7 this afternoon. No vomiting. No diarrhea. No ST. Right ear feels painful and that actually started last night, but no drainage.  Some mild dry cough today.  No sore throat.  She has some frontal headache pressure and just feels like her head hurts all over.  She says that is typically how she feels when she gets a sinus infection but normally she is congested with it and she denies any current congestion.   Past medical history, Surgical history, Family history not pertinant except as noted below, Social history, Allergies, and medications have been entered into the medical record, reviewed, and corrections made.   Review of Systems: No fevers, chills, night sweats, weight loss, chest pain, or shortness of breath.   Objective:    General: Speaking clearly in complete sentences without any shortness of breath.  Alert and oriented x3.  Normal judgment. No apparent acute distress. Well Groomed.    Impression and Recommendations:    Right ear pain/frontal headache-possible sinusitis versus otitis media.  Without being able to do an accurate exam over video visit will treat with Augmentin to cover for sinusitis and possible otitis media.  If she is not responding to the  medication or continues to have fever or suddenly gets worse or develops new or changing symptoms I encouraged her to call back and let us know immediately.  Nausea-she has had a little bit of nausea but no vomiting some to go ahead and send over prescription for her again as well to have on hand if needed.  If she starts to vomit please let us know.      I discussed the assessment and treatment plan with the patient. The patient was provided an opportunity to ask questions and all were answered. The patient agreed with the plan and demonstrated an understanding of the instructions.   The patient was advised to call back or seek an in-person evaluation if the symptoms worsen or if the condition fails to improve as anticipated.   Nancy Lecher, MD

## 2018-06-16 NOTE — Telephone Encounter (Signed)
Per Dr. Madilyn Fireman the patient called and pharmacy was stating that Fluoxetine was needing a PA. I called and spoke with Myriam Jacobson at the pharmacy and she stated that the patient has picked up the medication and did not need a PA. PCP made aware and closing encounter.

## 2018-06-16 NOTE — Telephone Encounter (Signed)
Pt called clinic, states the headache is worse. She now has some nausea and her temp is 99.7. Routing to PCP to see what next steps should be.

## 2018-06-16 NOTE — Telephone Encounter (Signed)
Can you add her on to my schedule vir telephone visit and I will call her now.

## 2018-06-16 NOTE — Telephone Encounter (Signed)
Done

## 2018-06-17 ENCOUNTER — Ambulatory Visit (INDEPENDENT_AMBULATORY_CARE_PROVIDER_SITE_OTHER): Payer: Medicare HMO | Admitting: Psychology

## 2018-06-17 DIAGNOSIS — R69 Illness, unspecified: Secondary | ICD-10-CM | POA: Diagnosis not present

## 2018-06-17 DIAGNOSIS — F419 Anxiety disorder, unspecified: Secondary | ICD-10-CM | POA: Diagnosis not present

## 2018-06-17 DIAGNOSIS — F331 Major depressive disorder, recurrent, moderate: Secondary | ICD-10-CM | POA: Diagnosis not present

## 2018-06-18 ENCOUNTER — Emergency Department (HOSPITAL_COMMUNITY): Payer: Medicare HMO

## 2018-06-18 ENCOUNTER — Encounter (HOSPITAL_COMMUNITY): Payer: Self-pay | Admitting: Emergency Medicine

## 2018-06-18 ENCOUNTER — Inpatient Hospital Stay (HOSPITAL_COMMUNITY)
Admission: EM | Admit: 2018-06-18 | Discharge: 2018-07-19 | DRG: 871 | Disposition: E | Payer: Medicare HMO | Attending: Pulmonary Disease | Admitting: Pulmonary Disease

## 2018-06-18 DIAGNOSIS — G9341 Metabolic encephalopathy: Secondary | ICD-10-CM | POA: Diagnosis present

## 2018-06-18 DIAGNOSIS — M810 Age-related osteoporosis without current pathological fracture: Secondary | ICD-10-CM | POA: Diagnosis present

## 2018-06-18 DIAGNOSIS — Z8249 Family history of ischemic heart disease and other diseases of the circulatory system: Secondary | ICD-10-CM

## 2018-06-18 DIAGNOSIS — J9602 Acute respiratory failure with hypercapnia: Secondary | ICD-10-CM | POA: Diagnosis present

## 2018-06-18 DIAGNOSIS — R52 Pain, unspecified: Secondary | ICD-10-CM | POA: Diagnosis not present

## 2018-06-18 DIAGNOSIS — R4182 Altered mental status, unspecified: Secondary | ICD-10-CM

## 2018-06-18 DIAGNOSIS — D472 Monoclonal gammopathy: Secondary | ICD-10-CM | POA: Diagnosis present

## 2018-06-18 DIAGNOSIS — Z803 Family history of malignant neoplasm of breast: Secondary | ICD-10-CM

## 2018-06-18 DIAGNOSIS — G9340 Encephalopathy, unspecified: Secondary | ICD-10-CM

## 2018-06-18 DIAGNOSIS — R079 Chest pain, unspecified: Secondary | ICD-10-CM | POA: Diagnosis not present

## 2018-06-18 DIAGNOSIS — R9401 Abnormal electroencephalogram [EEG]: Secondary | ICD-10-CM | POA: Diagnosis present

## 2018-06-18 DIAGNOSIS — Z85828 Personal history of other malignant neoplasm of skin: Secondary | ICD-10-CM

## 2018-06-18 DIAGNOSIS — R0689 Other abnormalities of breathing: Secondary | ICD-10-CM

## 2018-06-18 DIAGNOSIS — A419 Sepsis, unspecified organism: Secondary | ICD-10-CM | POA: Diagnosis not present

## 2018-06-18 DIAGNOSIS — R509 Fever, unspecified: Secondary | ICD-10-CM | POA: Diagnosis not present

## 2018-06-18 DIAGNOSIS — C9000 Multiple myeloma not having achieved remission: Secondary | ICD-10-CM | POA: Diagnosis present

## 2018-06-18 DIAGNOSIS — Z823 Family history of stroke: Secondary | ICD-10-CM

## 2018-06-18 DIAGNOSIS — Z20828 Contact with and (suspected) exposure to other viral communicable diseases: Secondary | ICD-10-CM | POA: Diagnosis present

## 2018-06-18 DIAGNOSIS — Z8349 Family history of other endocrine, nutritional and metabolic diseases: Secondary | ICD-10-CM

## 2018-06-18 DIAGNOSIS — G0490 Encephalitis and encephalomyelitis, unspecified: Secondary | ICD-10-CM | POA: Diagnosis present

## 2018-06-18 DIAGNOSIS — Z91040 Latex allergy status: Secondary | ICD-10-CM

## 2018-06-18 DIAGNOSIS — Z791 Long term (current) use of non-steroidal anti-inflammatories (NSAID): Secondary | ICD-10-CM

## 2018-06-18 DIAGNOSIS — J9811 Atelectasis: Secondary | ICD-10-CM | POA: Diagnosis present

## 2018-06-18 DIAGNOSIS — R9431 Abnormal electrocardiogram [ECG] [EKG]: Secondary | ICD-10-CM | POA: Diagnosis present

## 2018-06-18 DIAGNOSIS — J9601 Acute respiratory failure with hypoxia: Secondary | ICD-10-CM | POA: Diagnosis present

## 2018-06-18 DIAGNOSIS — R918 Other nonspecific abnormal finding of lung field: Secondary | ICD-10-CM | POA: Diagnosis not present

## 2018-06-18 DIAGNOSIS — R739 Hyperglycemia, unspecified: Secondary | ICD-10-CM | POA: Diagnosis not present

## 2018-06-18 DIAGNOSIS — Z978 Presence of other specified devices: Secondary | ICD-10-CM

## 2018-06-18 DIAGNOSIS — R404 Transient alteration of awareness: Secondary | ICD-10-CM | POA: Diagnosis not present

## 2018-06-18 DIAGNOSIS — D72819 Decreased white blood cell count, unspecified: Secondary | ICD-10-CM | POA: Diagnosis present

## 2018-06-18 DIAGNOSIS — B004 Herpesviral encephalitis: Secondary | ICD-10-CM | POA: Diagnosis present

## 2018-06-18 DIAGNOSIS — R51 Headache: Secondary | ICD-10-CM

## 2018-06-18 DIAGNOSIS — E861 Hypovolemia: Secondary | ICD-10-CM | POA: Diagnosis present

## 2018-06-18 DIAGNOSIS — R221 Localized swelling, mass and lump, neck: Secondary | ICD-10-CM

## 2018-06-18 DIAGNOSIS — R0600 Dyspnea, unspecified: Secondary | ICD-10-CM

## 2018-06-18 DIAGNOSIS — J90 Pleural effusion, not elsewhere classified: Secondary | ICD-10-CM | POA: Diagnosis present

## 2018-06-18 DIAGNOSIS — F418 Other specified anxiety disorders: Secondary | ICD-10-CM

## 2018-06-18 DIAGNOSIS — Z9849 Cataract extraction status, unspecified eye: Secondary | ICD-10-CM

## 2018-06-18 DIAGNOSIS — R0602 Shortness of breath: Secondary | ICD-10-CM

## 2018-06-18 DIAGNOSIS — J019 Acute sinusitis, unspecified: Secondary | ICD-10-CM | POA: Diagnosis not present

## 2018-06-18 DIAGNOSIS — E860 Dehydration: Secondary | ICD-10-CM | POA: Diagnosis present

## 2018-06-18 DIAGNOSIS — F331 Major depressive disorder, recurrent, moderate: Secondary | ICD-10-CM

## 2018-06-18 DIAGNOSIS — R410 Disorientation, unspecified: Secondary | ICD-10-CM | POA: Diagnosis not present

## 2018-06-18 DIAGNOSIS — G43909 Migraine, unspecified, not intractable, without status migrainosus: Secondary | ICD-10-CM | POA: Diagnosis present

## 2018-06-18 DIAGNOSIS — Z4659 Encounter for fitting and adjustment of other gastrointestinal appliance and device: Secondary | ICD-10-CM

## 2018-06-18 DIAGNOSIS — N1330 Unspecified hydronephrosis: Secondary | ICD-10-CM | POA: Clinically undetermined

## 2018-06-18 DIAGNOSIS — R519 Headache, unspecified: Secondary | ICD-10-CM

## 2018-06-18 DIAGNOSIS — E222 Syndrome of inappropriate secretion of antidiuretic hormone: Secondary | ICD-10-CM | POA: Diagnosis present

## 2018-06-18 DIAGNOSIS — E876 Hypokalemia: Secondary | ICD-10-CM | POA: Diagnosis not present

## 2018-06-18 DIAGNOSIS — Z79899 Other long term (current) drug therapy: Secondary | ICD-10-CM

## 2018-06-18 DIAGNOSIS — R911 Solitary pulmonary nodule: Secondary | ICD-10-CM

## 2018-06-18 DIAGNOSIS — R442 Other hallucinations: Secondary | ICD-10-CM | POA: Diagnosis not present

## 2018-06-18 DIAGNOSIS — Z800 Family history of malignant neoplasm of digestive organs: Secondary | ICD-10-CM

## 2018-06-18 DIAGNOSIS — Z808 Family history of malignant neoplasm of other organs or systems: Secondary | ICD-10-CM

## 2018-06-18 DIAGNOSIS — E871 Hypo-osmolality and hyponatremia: Secondary | ICD-10-CM

## 2018-06-18 DIAGNOSIS — Z833 Family history of diabetes mellitus: Secondary | ICD-10-CM

## 2018-06-18 DIAGNOSIS — Z66 Do not resuscitate: Secondary | ICD-10-CM | POA: Diagnosis not present

## 2018-06-18 DIAGNOSIS — R296 Repeated falls: Secondary | ICD-10-CM | POA: Diagnosis present

## 2018-06-18 DIAGNOSIS — R791 Abnormal coagulation profile: Secondary | ICD-10-CM | POA: Diagnosis not present

## 2018-06-18 DIAGNOSIS — Z888 Allergy status to other drugs, medicaments and biological substances status: Secondary | ICD-10-CM

## 2018-06-18 DIAGNOSIS — R441 Visual hallucinations: Secondary | ICD-10-CM | POA: Diagnosis present

## 2018-06-18 LAB — COMPREHENSIVE METABOLIC PANEL
ALT: 16 U/L (ref 0–44)
AST: 25 U/L (ref 15–41)
Albumin: 3.7 g/dL (ref 3.5–5.0)
Alkaline Phosphatase: 78 U/L (ref 38–126)
Anion gap: 10 (ref 5–15)
BUN: 19 mg/dL (ref 8–23)
CO2: 24 mmol/L (ref 22–32)
Calcium: 8.2 mg/dL — ABNORMAL LOW (ref 8.9–10.3)
Chloride: 91 mmol/L — ABNORMAL LOW (ref 98–111)
Creatinine, Ser: 0.79 mg/dL (ref 0.44–1.00)
GFR calc Af Amer: >60 mL/min (ref >60)
GFR calc non Af Amer: >60 mL/min (ref >60)
Glucose, Bld: 113 mg/dL — ABNORMAL HIGH (ref 70–99)
Potassium: 2.6 mmol/L — CL (ref 3.5–5.1)
Sodium: 125 mmol/L — ABNORMAL LOW (ref 135–145)
Total Bilirubin: 0.9 mg/dL (ref 0.3–1.2)
Total Protein: 6.8 g/dL (ref 6.5–8.1)

## 2018-06-18 LAB — CBC WITH DIFFERENTIAL/PLATELET
Abs Immature Granulocytes: 0.02 10*3/uL (ref 0.00–0.07)
Basophils Absolute: 0 10*3/uL (ref 0.0–0.1)
Basophils Relative: 0 %
Eosinophils Absolute: 0 10*3/uL (ref 0.0–0.5)
Eosinophils Relative: 0 %
HCT: 31.9 % — ABNORMAL LOW (ref 36.0–46.0)
Hemoglobin: 11 g/dL — ABNORMAL LOW (ref 12.0–15.0)
Immature Granulocytes: 0 %
Lymphocytes Relative: 19 %
Lymphs Abs: 1 10*3/uL (ref 0.7–4.0)
MCH: 33 pg (ref 26.0–34.0)
MCHC: 34.5 g/dL (ref 30.0–36.0)
MCV: 95.8 fL (ref 80.0–100.0)
Monocytes Absolute: 0.8 10*3/uL (ref 0.1–1.0)
Monocytes Relative: 16 %
Neutro Abs: 3.3 10*3/uL (ref 1.7–7.7)
Neutrophils Relative %: 65 %
Platelets: 232 10*3/uL (ref 150–400)
RBC: 3.33 MIL/uL — ABNORMAL LOW (ref 3.87–5.11)
RDW: 13.5 % (ref 11.5–15.5)
WBC: 5.1 10*3/uL (ref 4.0–10.5)
nRBC: 0 % (ref 0.0–0.2)

## 2018-06-18 LAB — SARS CORONAVIRUS 2 BY RT PCR (HOSPITAL ORDER, PERFORMED IN ~~LOC~~ HOSPITAL LAB): SARS Coronavirus 2: NEGATIVE

## 2018-06-18 LAB — TRIGLYCERIDES: Triglycerides: 106 mg/dL (ref <150)

## 2018-06-18 LAB — C-REACTIVE PROTEIN: CRP: <0.8 mg/dL (ref <1.0)

## 2018-06-18 LAB — LACTATE DEHYDROGENASE: LDH: 192 U/L (ref 98–192)

## 2018-06-18 LAB — FIBRINOGEN: Fibrinogen: 394 mg/dL (ref 210–475)

## 2018-06-18 LAB — FERRITIN: Ferritin: 68 ng/mL (ref 11–307)

## 2018-06-18 LAB — MAGNESIUM: Magnesium: 2 mg/dL (ref 1.7–2.4)

## 2018-06-18 LAB — D-DIMER, QUANTITATIVE: D-Dimer, Quant: 0.96 ug/mL-FEU — ABNORMAL HIGH (ref 0.00–0.50)

## 2018-06-18 LAB — LACTIC ACID, PLASMA
Lactic Acid, Venous: 1.5 mmol/L (ref 0.5–1.9)
Lactic Acid, Venous: 0.7 mmol/L (ref 0.5–1.9)

## 2018-06-18 LAB — PROCALCITONIN: Procalcitonin: <0.1 ng/mL

## 2018-06-18 MED ORDER — SODIUM CHLORIDE (PF) 0.9 % IJ SOLN
INTRAMUSCULAR | Status: AC
  Administered 2018-06-19: 3 mL
  Filled 2018-06-18: qty 50

## 2018-06-18 MED ORDER — SODIUM CHLORIDE 0.9 % IV BOLUS
2000.0000 mL | Freq: Once | INTRAVENOUS | Status: AC
  Administered 2018-06-18: 2000 mL via INTRAVENOUS

## 2018-06-18 MED ORDER — SODIUM CHLORIDE 0.9 % IV SOLN
1000.0000 mL | INTRAVENOUS | Status: AC
  Administered 2018-06-18 – 2018-06-19 (×2): 1000 mL via INTRAVENOUS

## 2018-06-18 MED ORDER — POTASSIUM CHLORIDE CRYS ER 20 MEQ PO TBCR
40.0000 meq | EXTENDED_RELEASE_TABLET | Freq: Once | ORAL | Status: AC
  Administered 2018-06-18: 40 meq via ORAL
  Filled 2018-06-18: qty 2

## 2018-06-18 MED ORDER — IOHEXOL 350 MG/ML SOLN
100.0000 mL | Freq: Once | INTRAVENOUS | Status: AC | PRN
  Administered 2018-06-19: 100 mL via INTRAVENOUS

## 2018-06-18 MED ORDER — POTASSIUM CHLORIDE 10 MEQ/100ML IV SOLN
10.0000 meq | INTRAVENOUS | Status: AC
  Administered 2018-06-18 – 2018-06-19 (×3): 10 meq via INTRAVENOUS
  Filled 2018-06-18 (×3): qty 100

## 2018-06-18 MED ORDER — ACETAMINOPHEN 325 MG PO TABS
650.0000 mg | ORAL_TABLET | Freq: Once | ORAL | Status: AC
  Administered 2018-06-18: 650 mg via ORAL
  Filled 2018-06-18: qty 2

## 2018-06-18 NOTE — ED Provider Notes (Signed)
[facility] DEPT Provider Note   CSN: [id number] Arrival date & time: 06/23/2018  1930    History   Chief Complaint No chief complaint on file.   HPI Nancy Parks is a 68 y.o. female.     HPI   She presents for evaluation of fever, by EMS.  She is unable to give history.  I was able to contact her sister, Nancy Parks, number in chart, who was able to give me history.  [name] lives with the patient.  The patient been ill for 2 days, with fever, nausea, vomiting, headache, sinus congestion, and has had several falls without significant injury.  Patient had a prescription for Augmentin called in for possible sinus traction by her PCP, 2 days ago.  She is taking this medicine.  She has not taken any fever medicine, despite having a fever for 2 days.  She may have taken some extra Prozac, last night, accidentally.  Patient has a chronic history of headaches, felt to be migraine, and has had some medication changes, to help treat those.  Level 5 caveat-altered mental status  Past Medical History:  Diagnosis Date  . Depression   . Headache(784.0)    Migraines  . Leukopenia   . Leukopenia 07/13/2012  . Multiple myeloma, without mention of having achieved remission 07/13/2012  . 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 Orthostatic hypotension 07/15/2017  . Dyspnea on exertion 07/15/2017  . Fatigue 05/18/2017  . Vitamin D deficiency 05/18/2017  . Right knee pain 08/30/2015  . Neck pain 08/28/2014  . Pseudophakia of both eyes 06/07/2014  . Cataract cortical, senile, left 05/31/2014  . Cataract, nuclear sclerotic, left eye 05/31/2014  . Localization-related idiopathic epilepsy and epileptic syndromes with seizures of localized onset, not intractable, without status epilepticus ([place]) 07/13/2012  . Leukopenia 07/13/2012  . Major depressive disorder,  recurrent episode, moderate ([place]) 10/02/2011  . Osteoporosis 01/08/2009  . Migraine 12/29/2008    Past Surgical History:  Procedure Laterality Date  . BASAL CELL CARCINOMA EXCISION  2013   removed from nose  . CATARACT EXTRACTION  09/2007  . LAPAROSCOPIC ENDOMETRIOSIS FULGURATION  1979     OB History   No obstetric history on file.      Home Medications    Prior to Admission medications   Medication Sig Start Date End Date Taking? Authorizing Provider  AMBULATORY NON FORMULARY MEDICATION Take 1 tablet by mouth daily. Medication Name: digestive enzymes    Yes [provider]  amoxicillin-clavulanate (AUGMENTIN) 875-125 MG tablet Take 1 tablet by mouth 2 (two) times daily. 06/16/18  Yes Hali Marry, MD  aspirin-acetaminophen-caffeine (EXCEDRIN MIGRAINE) [phone] MG tablet Take 1 tablet by mouth every 6 (six) hours as needed for headache.   Yes [provider]  Bioflavonoid Products (ESTER-C) 500-550 MG TABS Take 2 tablets by mouth daily.   Yes [provider]  CALCIUM & MAGNESIUM CARBONATES PO Take 4 capsules by mouth daily.    Yes [provider]  FLUoxetine (PROZAC) 10 MG capsule 3 cap po QD x 7 days, then 2 caps QD x 7 days, 1 cap po QD x 7 days, then stop. Patient taking differently: Take 10-30 mg by mouth See admin instructions. 3 cap po QD x 7 days, then 2 caps QD x 7 days, 1 cap po QD x 7 days, then stop. 06/15/18  Yes Elmore,  Rene Kocher, MD  hydroxypropyl methylcellulose / hypromellose (ISOPTO TEARS / GONIOVISC) 2.5 % ophthalmic solution Place 1 drop into both eyes 3 (three) times daily as needed for dry eyes.   Yes [provider]  naproxen (NAPROSYN) 500 MG tablet Take 1 tablet with each sumatriptan as directed. Patient taking differently: Take 500 mg by mouth 2 (two) times daily as needed for moderate pain. Take 1 tablet with each sumatriptan as directed. 10/07/17  Yes Jaffe, Adam R, DO  OVER THE COUNTER MEDICATION Take  1 tablet by mouth daily. Nuclo Immune   Yes [provider]  promethazine (PHENERGAN) 25 MG tablet Take 1 tablet (25 mg total) by mouth every 8 (eight) hours as needed for nausea or vomiting. 06/16/18  Yes Hali Marry, MD  SUMAtriptan (IMITREX) 100 MG tablet Take 100 mg by mouth as needed for migraine.   Yes [provider]  Triprolidine-Pseudoephedrine (HISTA-TABS PO) Take 1 tablet by mouth 2 (two) times daily as needed. Allergies   Yes [provider]  vitamin B-12 (CYANOCOBALAMIN) 1000 MCG tablet Take 1,000 mcg by mouth daily. Increase to 2000 mcg if needed   Yes [provider]  Vitamin D, Cholecalciferol, 50 MCG (2000 UT) CAPS Take 2,000 Units by mouth daily.   Yes [provider]  Vitamins A & D 10000-400 units TABS Take 2 tablets by mouth daily.   Yes [provider]  Zinc 25 MG TABS Take 50 mg by mouth daily.   Yes [provider]  nortriptyline (PAMELOR) 75 MG capsule TAKE 1 CAPSULE BY MOUTH AT BEDTIME Patient taking differently: Take 75 mg by mouth at bedtime.  05/24/18   Pieter Partridge, DO    Family History Family History  Problem Relation Age of Onset  . Breast cancer Other        grandmother   . Colon cancer Father        colon adenoma  . Heart attack Father   . Hyperlipidemia Sister   . Hypertension Sister   . Cancer Sister        throat, smoker  . Melanoma Sister   . Diabetes Sister   . CVA Mother   . Diabetes Brother     Social History Social History   Tobacco Use  . Smoking status: Never Smoker  . Smokeless tobacco: Never Used  . Tobacco comment: never used tobacco  Substance Use Topics  . Alcohol use: No    Alcohol/week: 0.0 standard drinks  . Drug use: No     Allergies   Latex; Other; Tape; and Fosamax [alendronate sodium]   Review of Systems Review of Systems  Unable to perform ROS: Mental status change     Physical Exam Updated Vital Signs BP (!) 127/106   Pulse (!) 104    Temp (!) 102.4 F (39.1 C) (Oral)   Resp 20   SpO2 90%   Physical Exam Vitals signs and nursing note reviewed.  Constitutional:      General: She is in acute distress.     Appearance: She is well-developed. She is ill-appearing and toxic-appearing. She is not diaphoretic.  HENT:     Head: Normocephalic and atraumatic.     Right Ear: External ear normal.     Left Ear: External ear normal.     Nose: No congestion.  Eyes:     Conjunctiva/sclera: Conjunctivae normal.     Pupils: Pupils are equal, round, and reactive to light.  Neck:  Musculoskeletal: Normal range of motion and neck supple.     Trachea: Phonation normal.  Cardiovascular:     Rate and Rhythm: Normal rate.  Pulmonary:     Effort: Pulmonary effort is normal.  Abdominal:     General: There is no distension.     Palpations: Abdomen is soft.     Tenderness: There is no abdominal tenderness.  Musculoskeletal: Normal range of motion.  Skin:    General: Skin is warm and dry.  Neurological:     Mental Status: She is alert. She is disoriented.     Cranial Nerves: No cranial nerve deficit.     Motor: No abnormal muscle tone.     Coordination: Coordination normal.  Psychiatric:     Comments: Lethargic      ED Treatments / Results  Labs (all labs ordered are listed, but only abnormal results are displayed) Labs Reviewed  CBC WITH DIFFERENTIAL/PLATELET - Abnormal; Notable for the following components:      Result Value   RBC 3.33 (*)    Hemoglobin 11.0 (*)    HCT 31.9 (*)    All other components within normal limits  COMPREHENSIVE METABOLIC PANEL - Abnormal; Notable for the following components:   Sodium 125 (*)    Potassium 2.6 (*)    Chloride 91 (*)    Glucose, Bld 113 (*)    Calcium 8.2 (*)    All other components within normal limits  D-DIMER, QUANTITATIVE (NOT AT [facility]) - Abnormal; Notable for the following components:   D-Dimer, Quant 0.96 (*)    All other components within normal limits  SARS  CORONAVIRUS 2 (HOSPITAL ORDER, [place] [facility])  CULTURE, BLOOD (ROUTINE X 2)  CULTURE, BLOOD (ROUTINE X 2)  LACTIC ACID, PLASMA  PROCALCITONIN  LACTATE DEHYDROGENASE  FERRITIN  TRIGLYCERIDES  FIBRINOGEN  C-REACTIVE PROTEIN  LACTIC ACID, PLASMA  URINALYSIS, ROUTINE W REFLEX MICROSCOPIC  MAGNESIUM    EKG EKG Interpretation  Date/Time:  Friday Jun 18 2018 19:47:57 EDT Ventricular Rate:  96 PR Interval:    QRS Duration: 112 QT Interval:  392 QTC Calculation: 496 R Axis:   50 Text Interpretation:  Sinus rhythm Borderline intraventricular conduction delay Abnormal R-wave progression, early transition Minimal ST depression, diffuse leads Borderline ST elevation, anterior leads Borderline prolonged QT interval No old tracing to compare Confirmed by Daleen Bo [phone]) on 07/15/2018 8:19:13 PM   Radiology Ct Head Wo Contrast  Result Date: 06/23/2018 CLINICAL DATA:  Altered level of consciousness, headache, fever, sinus infection, confusion, disorientation, history multiple myeloma EXAM: CT HEAD WITHOUT CONTRAST TECHNIQUE: Contiguous axial images were obtained from the base of the skull through the vertex without intravenous contrast. Sagittal and coronal MPR images reconstructed from axial data set. COMPARISON:  03/20/2015 Correlation: MR brain 06/14/2018 FINDINGS: Brain: Generalized atrophy. Normal ventricular morphology. No midline shift or mass effect. Small vessel chronic ischemic changes of deep cerebral white matter. No intracranial hemorrhage, mass lesion, evidence of acute infarction, or extra-axial fluid collection. Vascular: No definite hyperdense vessels Skull: Intact Sinuses/Orbits: Clear Other: N/A IMPRESSION: Atrophy with small vessel chronic ischemic changes of deep cerebral white matter. No acute intracranial abnormalities. Electronically Signed   By: Lavonia Dana M.D.   On: 07/04/2018 23:10   Dg Chest Port 1 View  Result Date: 07/16/2018 CLINICAL DATA:   Fever, sinus infection, headaches EXAM: PORTABLE CHEST 1 VIEW COMPARISON:  07/13/2012 FINDINGS: There is no focal parenchymal opacity. There is no pleural effusion or pneumothorax. The  heart and mediastinal contours are unremarkable. The osseous structures are unremarkable. IMPRESSION: No active disease. Electronically Signed   By: Kathreen Devoid   On: 07/15/2018 20:59    Procedures Procedures (including critical care time)  Medications Ordered in ED Medications  0.9 %  sodium chloride infusion (1,000 mLs Intravenous New Bag/Given 06/23/2018 2043)  potassium chloride 10 mEq in 100 mL IVPB (10 mEq Intravenous New Bag/Given 07/14/2018 2236)  acetaminophen (TYLENOL) tablet 650 mg (650 mg Oral Given 07/10/2018 2018)  potassium chloride SA (K-DUR) CR tablet 40 mEq (40 mEq Oral Given 06/22/2018 2310)  sodium chloride 0.9 % bolus 2,000 mL (2,000 mLs Intravenous New Bag/Given 07/08/2018 2311)     Initial Impression / Assessment and Plan / ED Course  I have reviewed the triage vital signs and the nursing notes.  Pertinent labs & imaging results that were available during my care of the patient were reviewed by me and considered in my medical decision making (see chart for details).  Clinical Course as of Jun 18 2322  Fri Jun 18, 2018  2500 She presents with fever and confusion.  She has been sick for several days.  She is currently taking Augmentin, to treat "sinus infection," diagnosed by telemedicine visit.  I suspect Covid-19 infection therefore will initiate evaluation treatment for that.   [EW]  [place] [phone].  She is the patient's healthcare power of attorney lives with the patient.   [EW]  2226 Normal except hemoglobin low  CBC WITH DIFFERENTIAL(!) [EW]  2227 Mild elevation, ordered a screening for COVID, not as a screen for PE.  D-dimer, quantitative(!) [EW]  2227 Normal  C-reactive protein [EW]  2227 Normal  Lactic acid, plasma [EW]  2227 Normal  SARS Coronavirus 2 [facility] [place] order,  Performed in [place] hospital lab) [EW]  2227 Normal  Ferritin [EW]  2227 Normal  Lactate dehydrogenase [EW]  2227 Normal except sodium low, potassium low, chloride low, calcium low  Comprehensive metabolic panel(!!) [EW]  9450 Normal  Fibrinogen [EW]  2322 CT Head Wo Contrast [EW]    Clinical Course User Index [EW] Daleen Bo, MD        Patient Vitals for the past 24 hrs:  BP Temp Temp src Pulse Resp SpO2  07/03/2018 2206 - - - (!) 104 20 90 %  07/15/2018 2200 (!) 127/106 - - (!) 102 (!) 24 -  06/24/2018 2145 123/76 - - (!) 103 (!) 24 91 %  07/14/2018 2115 133/73 - - (!) 102 17 92 %  07/05/2018 2100 (!) 146/77 - - (!) 102 15 96 %  07/18/2018 2045 136/70 - - 100 19 96 %  07/15/2018 2030 (!) 143/79 - - 99 (!) 26 96 %  07/16/2018 2015 124/88 - - 99 16 96 %  06/26/2018 2000 128/68 - - 96 20 96 %  06/23/2018 1946 132/77 (!) 102.4 F (39.1 C) Oral 96 16 93 %  07/06/2018 1945 132/77 - - 96 20 93 %    10:25 PM Reevaluation with update and discussion. After initial assessment and treatment, an updated evaluation reveals patient is more alert but still is confused.  At this time she complains of a headache.  Covid-19 screening test negative.  Additional evaluation treatment ordered at this time. Daleen Bo   Medical Decision Making: Febrile illness with nonspecific symptoms.  Patient with moderate confusion.  After initial screening, COVID negative, sepsis fluid bolus ordered.  Initial lactate is normal.  Metabolic evaluation consistent with dehydration, require additional  IV fluids and potassium supplementation.  Magnesium level ordered.  Head CT normal, no evidence for sinusitis.  CRITICAL CARE-yes Performed by: Daleen Bo  Nursing Notes Reviewed/ Care Coordinated Applicable Imaging Reviewed Interpretation of Laboratory Data incorporated into ED treatment   Plan-disposition per oncoming provider team after return of testing.   Final Clinical Impressions(s) / ED Diagnoses   Final  diagnoses:  Febrile illness  Confusion  Hypokalemia  Dehydration    ED Discharge Orders    None       Daleen Bo, MD 07/15/2018 2325

## 2018-06-18 NOTE — ED Triage Notes (Signed)
Patient came in from home due to headache, fever, sinus infection, confusion and disorientation. She was seen by an MD on Wednesday that prescribed her an antibiotic and an antiviral.    EMS vitals: 100.0 temp 147/90 BP 98 HR 98% O2 sats on room air 138 CBG 16 Resp Rate

## 2018-06-19 ENCOUNTER — Other Ambulatory Visit: Payer: Self-pay

## 2018-06-19 ENCOUNTER — Observation Stay (HOSPITAL_COMMUNITY): Payer: Medicare HMO

## 2018-06-19 DIAGNOSIS — R221 Localized swelling, mass and lump, neck: Secondary | ICD-10-CM | POA: Diagnosis not present

## 2018-06-19 DIAGNOSIS — R509 Fever, unspecified: Secondary | ICD-10-CM | POA: Diagnosis present

## 2018-06-19 DIAGNOSIS — E876 Hypokalemia: Secondary | ICD-10-CM

## 2018-06-19 DIAGNOSIS — R4182 Altered mental status, unspecified: Secondary | ICD-10-CM | POA: Diagnosis not present

## 2018-06-19 DIAGNOSIS — R791 Abnormal coagulation profile: Secondary | ICD-10-CM | POA: Diagnosis not present

## 2018-06-19 DIAGNOSIS — E871 Hypo-osmolality and hyponatremia: Secondary | ICD-10-CM

## 2018-06-19 DIAGNOSIS — R079 Chest pain, unspecified: Secondary | ICD-10-CM | POA: Diagnosis not present

## 2018-06-19 DIAGNOSIS — R911 Solitary pulmonary nodule: Secondary | ICD-10-CM

## 2018-06-19 DIAGNOSIS — R0602 Shortness of breath: Secondary | ICD-10-CM | POA: Diagnosis not present

## 2018-06-19 DIAGNOSIS — F418 Other specified anxiety disorders: Secondary | ICD-10-CM

## 2018-06-19 LAB — BASIC METABOLIC PANEL
Anion gap: 8 (ref 5–15)
Anion gap: 8 (ref 5–15)
BUN: 15 mg/dL (ref 8–23)
BUN: 14 mg/dL (ref 8–23)
CO2: 24 mmol/L (ref 22–32)
CO2: 23 mmol/L (ref 22–32)
Calcium: 7.7 mg/dL — ABNORMAL LOW (ref 8.9–10.3)
Calcium: 7.8 mg/dL — ABNORMAL LOW (ref 8.9–10.3)
Chloride: 98 mmol/L (ref 98–111)
Chloride: 98 mmol/L (ref 98–111)
Creatinine, Ser: 0.62 mg/dL (ref 0.44–1.00)
Creatinine, Ser: 0.72 mg/dL (ref 0.44–1.00)
GFR calc Af Amer: >60 mL/min (ref >60)
GFR calc Af Amer: >60 mL/min (ref >60)
GFR calc non Af Amer: >60 mL/min (ref >60)
GFR calc non Af Amer: >60 mL/min (ref >60)
Glucose, Bld: 102 mg/dL — ABNORMAL HIGH (ref 70–99)
Glucose, Bld: 83 mg/dL (ref 70–99)
Potassium: 3.4 mmol/L — ABNORMAL LOW (ref 3.5–5.1)
Potassium: 3.1 mmol/L — ABNORMAL LOW (ref 3.5–5.1)
Sodium: 130 mmol/L — ABNORMAL LOW (ref 135–145)
Sodium: 129 mmol/L — ABNORMAL LOW (ref 135–145)

## 2018-06-19 LAB — CBC
HCT: 34 % — ABNORMAL LOW (ref 36.0–46.0)
Hemoglobin: 11.3 g/dL — ABNORMAL LOW (ref 12.0–15.0)
MCH: 32 pg (ref 26.0–34.0)
MCHC: 33.2 g/dL (ref 30.0–36.0)
MCV: 96.3 fL (ref 80.0–100.0)
Platelets: 233 10*3/uL (ref 150–400)
RBC: 3.53 MIL/uL — ABNORMAL LOW (ref 3.87–5.11)
RDW: 13.5 % (ref 11.5–15.5)
WBC: 5.3 10*3/uL (ref 4.0–10.5)
nRBC: 0 % (ref 0.0–0.2)

## 2018-06-19 LAB — URINALYSIS, ROUTINE W REFLEX MICROSCOPIC
Bacteria, UA: NONE SEEN
Bilirubin Urine: NEGATIVE
Glucose, UA: 50 mg/dL — AB
Hgb urine dipstick: NEGATIVE
Ketones, ur: 20 mg/dL — AB
Leukocytes,Ua: NEGATIVE
Nitrite: NEGATIVE
Protein, ur: 100 mg/dL — AB
Specific Gravity, Urine: 1.024 (ref 1.005–1.030)
pH: 5 (ref 5.0–8.0)

## 2018-06-19 LAB — TSH: TSH: 0.223 u[IU]/mL — ABNORMAL LOW (ref 0.350–4.500)

## 2018-06-19 LAB — SODIUM, URINE, RANDOM: Sodium, Ur: 11 mmol/L

## 2018-06-19 LAB — OSMOLALITY, URINE: Osmolality, Ur: 804 mOsm/kg (ref 300–900)

## 2018-06-19 LAB — T4, FREE: Free T4: 0.89 ng/dL (ref 0.82–1.77)

## 2018-06-19 LAB — MAGNESIUM: Magnesium: 2 mg/dL (ref 1.7–2.4)

## 2018-06-19 LAB — OSMOLALITY: Osmolality: 263 mOsm/kg — ABNORMAL LOW (ref 275–295)

## 2018-06-19 LAB — HIV ANTIBODY (ROUTINE TESTING W REFLEX): HIV Screen 4th Generation wRfx: NONREACTIVE

## 2018-06-19 LAB — CK: Total CK: 331 U/L — ABNORMAL HIGH (ref 38–234)

## 2018-06-19 MED ORDER — FLUOXETINE HCL 20 MG PO CAPS: 20.0000 mg | ORAL_CAPSULE | Freq: Every day | ORAL | Status: DC

## 2018-06-19 MED ORDER — FLUOXETINE HCL 10 MG PO CAPS
30.0000 mg | ORAL_CAPSULE | Freq: Every day | ORAL | Status: DC
  Filled 2018-06-19: qty 3

## 2018-06-19 MED ORDER — SODIUM CHLORIDE 0.9 % IV SOLN
1000.0000 mL | INTRAVENOUS | Status: DC
  Administered 2018-06-19 – 2018-06-20 (×4): 1000 mL via INTRAVENOUS

## 2018-06-19 MED ORDER — DEXTROSE 5 % IV SOLN
10.0000 mg/kg | Freq: Three times a day (TID) | INTRAVENOUS | Status: DC
  Administered 2018-06-19 – 2018-06-27 (×23): 890 mg via INTRAVENOUS
  Filled 2018-06-19 (×25): qty 17.8

## 2018-06-19 MED ORDER — SODIUM CHLORIDE 0.9 % IV SOLN: 1000.0000 mL | INTRAVENOUS | Status: DC

## 2018-06-19 MED ORDER — SODIUM CHLORIDE 0.9 % IV SOLN
2.0000 g | INTRAVENOUS | Status: DC
  Filled 2018-06-19 (×2): qty 2000

## 2018-06-19 MED ORDER — DEXTROSE 5 % IV SOLN
10.0000 mg/kg | Freq: Three times a day (TID) | INTRAVENOUS | Status: DC
  Filled 2018-06-19 (×2): qty 17.8

## 2018-06-19 MED ORDER — FLUOXETINE HCL 10 MG PO CAPS: 10.0000 mg | ORAL_CAPSULE | Freq: Every day | ORAL | Status: DC

## 2018-06-19 MED ORDER — ACETAMINOPHEN 650 MG RE SUPP
650.0000 mg | Freq: Four times a day (QID) | RECTAL | Status: DC | PRN
  Administered 2018-06-19 – 2018-06-23 (×9): 650 mg via RECTAL
  Filled 2018-06-19 (×9): qty 1

## 2018-06-19 MED ORDER — NORTRIPTYLINE HCL 25 MG PO CAPS: 75.0000 mg | ORAL_CAPSULE | Freq: Every day | ORAL | Status: DC

## 2018-06-19 MED ORDER — VANCOMYCIN HCL 10 G IV SOLR
2000.0000 mg | INTRAVENOUS | Status: AC
  Administered 2018-06-19: 2000 mg via INTRAVENOUS
  Filled 2018-06-19: qty 2000

## 2018-06-19 MED ORDER — SODIUM CHLORIDE 0.9 % IV SOLN
2.0000 g | INTRAVENOUS | Status: DC
  Filled 2018-06-19 (×3): qty 2000

## 2018-06-19 MED ORDER — SODIUM CHLORIDE 0.9 % IV SOLN
2.0000 g | Freq: Two times a day (BID) | INTRAVENOUS | Status: DC
  Administered 2018-06-19 – 2018-06-20 (×3): 2 g via INTRAVENOUS
  Filled 2018-06-19 (×3): qty 2

## 2018-06-19 MED ORDER — ENOXAPARIN SODIUM 40 MG/0.4ML ~~LOC~~ SOLN
40.0000 mg | Freq: Every day | SUBCUTANEOUS | Status: DC
  Administered 2018-06-19: 12:00:00 40 mg via SUBCUTANEOUS
  Filled 2018-06-19: qty 0.4

## 2018-06-19 MED ORDER — ONDANSETRON HCL 4 MG PO TABS: 4.0000 mg | ORAL_TABLET | Freq: Four times a day (QID) | ORAL | Status: DC | PRN

## 2018-06-19 MED ORDER — ONDANSETRON HCL 4 MG/2ML IJ SOLN: 4.0000 mg | Freq: Four times a day (QID) | INTRAMUSCULAR | Status: DC | PRN

## 2018-06-19 MED ORDER — ACETAMINOPHEN 325 MG PO TABS
650.0000 mg | ORAL_TABLET | Freq: Four times a day (QID) | ORAL | Status: DC | PRN
  Administered 2018-06-21: 650 mg via ORAL
  Filled 2018-06-19: qty 2

## 2018-06-19 MED ORDER — SODIUM CHLORIDE 0.9 % IV SOLN
2.0000 g | INTRAVENOUS | Status: DC
  Administered 2018-06-19 – 2018-06-20 (×7): 2 g via INTRAVENOUS
  Filled 2018-06-19 (×3): qty 2
  Filled 2018-06-19 (×2): qty 2000
  Filled 2018-06-19: qty 2
  Filled 2018-06-19 (×2): qty 2000

## 2018-06-19 MED ORDER — SODIUM CHLORIDE 0.9% FLUSH
3.0000 mL | Freq: Two times a day (BID) | INTRAVENOUS | Status: DC
  Administered 2018-06-19 – 2018-06-27 (×18): 3 mL via INTRAVENOUS

## 2018-06-19 MED ORDER — THIAMINE HCL 100 MG/ML IJ SOLN
100.0000 mg | Freq: Three times a day (TID) | INTRAMUSCULAR | Status: DC
  Administered 2018-06-19 – 2018-06-23 (×12): 100 mg via INTRAVENOUS
  Filled 2018-06-19 (×12): qty 2

## 2018-06-19 MED ORDER — POTASSIUM CHLORIDE 20 MEQ PO PACK
40.0000 meq | PACK | ORAL | Status: AC
  Filled 2018-06-19 (×2): qty 2

## 2018-06-19 MED ORDER — VANCOMYCIN HCL 10 G IV SOLR
1250.0000 mg | Freq: Two times a day (BID) | INTRAVENOUS | Status: DC
  Administered 2018-06-20 (×2): 1250 mg via INTRAVENOUS
  Filled 2018-06-19 (×2): qty 1250

## 2018-06-19 NOTE — H&P (Signed)
History and Physical    Nancy Parks OVF:[id number] DOB: 1951-01-03 DOA: 06/19/2018  PCP: Hali Marry, MD  Patient coming from: Home  I have personally briefly reviewed patient's old medical records in [place]  Chief Complaint: Altered mental status, fever  HPI: Nancy Parks is a 68 y.o. female with medical history significant for IgG lambda smoldering myeloma, depression with anxiety, and migraines who is brought to the ED for evaluation of approximately 2 days of fevers, nausea, vomiting.  History is limited from patient due to altered mental status therefore majority is obtained from her healthcare power of attorney [name] by phone and chart review.  Patient has about 1 week of decreased conditioning and oral intake.  She is being evaluated by her PCP for hallucinations recently thought related to her fluoxetine which is being titrated down to eventually discontinue.  Patient has been taking fluoxetine 40 mg daily with plan to taper down by 10 mg every week, however patient appears to have continued fluoxetine at 40 mg.  She has also been having worsening headaches.  She had an MRI brain without contrast on 06/14/2018 which showed changes suggestive of chronic microvascular ischemic changes without acute intracranial findings.  She had a telemedicine visit with her PCP on 06/16/2018 for right ear pain and possible sinusitis versus otitis media and was started on treatment with Augmentin.  She had episodes of nausea and vomiting lasting over a period of 36 hours prior to this admission.  She has not had any cough, dyspnea, dysuria, diarrhea.  ED Course:  Initial vitals showed BP 120/68, pulse 96, RR 20, temp 102.4 Fahrenheit, SPO2 96% on room air.  Labs are notable for WBC 5.1, hemoglobin 11.0, platelets 232,000, sodium 125, potassium 2.6, magnesium 2.0, chloride 91, bicarb 24, BUN 19, creatinine 0.79, glucose 113, lactic acid 1.5, LDH 192, ferritin 68, triglycerides 106,  fibrinogen 394, CRP <0.8, procalcitonin <0.10.  Urinalysis was negative for UTI.  Blood cultures were drawn and pending.  SARS-CoV-2 nasopharyngeal swab was negative.  D-dimer was 0.96.  Portable chest x-ray was without acute cardiopulmonary process.  CT head without contrast was negative for acute intracranial abnormalities, atrophy with small vessel chronic ischemic changes of deep cerebral white matter was noted.  CTA chest PE study was negative for pulmonary embolus.  Bibasilar atelectasis and an 8 mm nodule in the medial superior segment of the left lower lobe was noted.  Patient was given IV potassium 10 mEq x 3 runs and oral 40 mEq x 1.  She was given 2 L normal saline.  The hospitalist service was consulted admit for further evaluation and management.   Review of Systems: As per HPI otherwise 10 point review of systems negative.    Past Medical History:  Diagnosis Date  . Depression   . Headache(784.0)    Migraines  . Leukopenia   . Leukopenia 07/13/2012  . Multiple myeloma, without mention of having achieved remission 07/13/2012  . Smoldering multiple myeloma ([place]) 03/08/2018    Past Surgical History:  Procedure Laterality Date  . BASAL CELL CARCINOMA EXCISION  2013   removed from nose  . CATARACT EXTRACTION  09/2007  . LAPAROSCOPIC ENDOMETRIOSIS FULGURATION  1979     reports that she has never smoked. She has never used smokeless tobacco. She reports that she does not drink alcohol or use drugs.  Allergies  Allergen Reactions  . Latex Rash    REACTION: Rash  REACTION: Rash  . Other Rash  .  Tape Rash  . Fosamax [Alendronate Sodium] Other (See Comments)    GI upset    Family History  Problem Relation Age of Onset  . Breast cancer Other        grandmother   . Colon cancer Father        colon adenoma  . Heart attack Father   . Hyperlipidemia Sister   . Hypertension Sister   . Cancer Sister        throat, smoker  . Melanoma Sister   . Diabetes Sister   .  CVA Mother   . Diabetes Brother      Prior to Admission medications   Medication Sig Start Date End Date Taking? Authorizing Provider  AMBULATORY NON FORMULARY MEDICATION Take 1 tablet by mouth daily. Medication Name: digestive enzymes    Yes [provider]  amoxicillin-clavulanate (AUGMENTIN) 875-125 MG tablet Take 1 tablet by mouth 2 (two) times daily. 06/16/18  Yes Hali Marry, MD  aspirin-acetaminophen-caffeine (EXCEDRIN MIGRAINE) [phone] MG tablet Take 1 tablet by mouth every 6 (six) hours as needed for headache.   Yes [provider]  Bioflavonoid Products (ESTER-C) 500-550 MG TABS Take 2 tablets by mouth daily.   Yes [provider]  CALCIUM & MAGNESIUM CARBONATES PO Take 4 capsules by mouth daily.    Yes [provider]  FLUoxetine (PROZAC) 10 MG capsule 3 cap po QD x 7 days, then 2 caps QD x 7 days, 1 cap po QD x 7 days, then stop. Patient taking differently: Take 10-30 mg by mouth See admin instructions. 3 cap po QD x 7 days, then 2 caps QD x 7 days, 1 cap po QD x 7 days, then stop. 06/15/18  Yes Hali Marry, MD  hydroxypropyl methylcellulose / hypromellose (ISOPTO TEARS / GONIOVISC) 2.5 % ophthalmic solution Place 1 drop into both eyes 3 (three) times daily as needed for dry eyes.   Yes [provider]  naproxen (NAPROSYN) 500 MG tablet Take 1 tablet with each sumatriptan as directed. Patient taking differently: Take 500 mg by mouth 2 (two) times daily as needed for moderate pain. Take 1 tablet with each sumatriptan as directed. 10/07/17  Yes Jaffe, Adam R, DO  OVER THE COUNTER MEDICATION Take 1 tablet by mouth daily. Nuclo Immune   Yes [provider]  promethazine (PHENERGAN) 25 MG tablet Take 1 tablet (25 mg total) by mouth every 8 (eight) hours as needed for nausea or vomiting. 06/16/18  Yes Hali Marry, MD  SUMAtriptan (IMITREX) 100 MG tablet Take 100 mg by mouth as needed for migraine.   Yes  [provider]  Triprolidine-Pseudoephedrine (HISTA-TABS PO) Take 1 tablet by mouth 2 (two) times daily as needed. Allergies   Yes [provider]  vitamin B-12 (CYANOCOBALAMIN) 1000 MCG tablet Take 1,000 mcg by mouth daily. Increase to 2000 mcg if needed   Yes [provider]  Vitamin D, Cholecalciferol, 50 MCG (2000 UT) CAPS Take 2,000 Units by mouth daily.   Yes [provider]  Vitamins A & D 10000-400 units TABS Take 2 tablets by mouth daily.   Yes [provider]  Zinc 25 MG TABS Take 50 mg by mouth daily.   Yes [provider]  nortriptyline (PAMELOR) 75 MG capsule TAKE 1 CAPSULE BY MOUTH AT BEDTIME Patient taking differently: Take 75 mg by mouth at bedtime.  05/24/18   Pieter Partridge, DO    Physical Exam: Vitals:   06/19/18 0115 06/19/18  0130 06/19/18 0132 06/19/18 0200  BP: (!) 124/113 (!) 143/86  123/88  Pulse: (!) 102 99 97 (!) 103  Resp: 17 (!) 25 15 (!) 21  Temp:      TempSrc:      SpO2: 93% 97% 98% 98%    Constitutional: Disheveled woman sitting up in bed, NAD, calm, comfortable, appears tired. Eyes: PERRL, EOMI, lids and conjunctivae normal ENMT: Mucous membranes are moist. Posterior pharynx clear of any exudate or lesions.Normal dentition.  Neck: normal, supple, no masses.  Range of motion intact. Respiratory: clear to auscultation bilaterally, no wheezing, no crackles. Normal respiratory effort. No accessory muscle use.  Cardiovascular: Regular rate and rhythm, no murmurs / rubs / gallops. No extremity edema. 2+ pedal pulses. Abdomen: no tenderness, no masses palpated. No hepatosplenomegaly. Bowel sounds positive.  Musculoskeletal: no clubbing / cyanosis. No joint deformity upper and lower extremities. Good ROM, no contractures. Normal muscle tone.  Skin: Ecchymosis left mid back, bruising to anterior right shin Neurologic: CN 2-12 grossly intact. Sensation intact, DTR normal. Strength 5/5 in all 4.  No asterixis or  dysmetria. Psychiatric: Alert and oriented to person and year, states she is in [facility] when asked location.  She reports occasional auditory and visual hallucinations and depressed mood.  She denies any suicidal ideation.    Labs on Admission: I have personally reviewed following labs and imaging studies  CBC: Recent Labs  Lab 07/17/2018 2048  WBC 5.1  NEUTROABS 3.3  HGB 11.0*  HCT 31.9*  MCV 95.8  PLT 621   Basic Metabolic Panel: Recent Labs  Lab 07/07/2018 2048 06/24/2018 2317  NA 125*  --   K 2.6*  --   CL 91*  --   CO2 24  --   GLUCOSE 113*  --   BUN 19  --   CREATININE 0.79  --   CALCIUM 8.2*  --   MG  --  2.0   GFR: Estimated Creatinine Clearance: 79.4 mL/min (by C-G formula based on SCr of 0.79 mg/dL). Liver Function Tests: Recent Labs  Lab 07/01/2018 2048  AST 25  ALT 16  ALKPHOS 78  BILITOT 0.9  PROT 6.8  ALBUMIN 3.7   No results for input(s): LIPASE, AMYLASE in the last 168 hours. No results for input(s): AMMONIA in the last 168 hours. Coagulation Profile: No results for input(s): INR, PROTIME in the last 168 hours. Cardiac Enzymes: No results for input(s): CKTOTAL, CKMB, CKMBINDEX, TROPONINI in the last 168 hours. BNP (last 3 results) No results for input(s): PROBNP in the last 8760 hours. HbA1C: No results for input(s): HGBA1C in the last 72 hours. CBG: No results for input(s): GLUCAP in the last 168 hours. Lipid Profile: Recent Labs    07/15/2018 2048  TRIG 106   Thyroid Function Tests: No results for input(s): TSH, T4TOTAL, FREET4, T3FREE, THYROIDAB in the last 72 hours. Anemia Panel: Recent Labs    07/08/2018 2048  FERRITIN 68   Urine analysis:    Component Value Date/Time   COLORURINE YELLOW 07/08/2018 [place] 06/25/2018 2345   LABSPEC 1.024 07/09/2018 2345   PHURINE 5.0 07/16/2018 2345   GLUCOSEU 50 (A) 07/14/2018 2345   HGBUR NEGATIVE 06/26/2018 2345   BILIRUBINUR NEGATIVE 07/10/2018 2345   KETONESUR 20 (A)  06/25/2018 2345   PROTEINUR 100 (A) 07/07/2018 2345   NITRITE NEGATIVE 06/23/2018 2345   LEUKOCYTESUR NEGATIVE 06/29/2018 2345    Radiological Exams on Admission: Ct Head Wo Contrast  Result Date:  07/07/2018 CLINICAL DATA:  Altered level of consciousness, headache, fever, sinus infection, confusion, disorientation, history multiple myeloma EXAM: CT HEAD WITHOUT CONTRAST TECHNIQUE: Contiguous axial images were obtained from the base of the skull through the vertex without intravenous contrast. Sagittal and coronal MPR images reconstructed from axial data set. COMPARISON:  03/20/2015 Correlation: MR brain 06/14/2018 FINDINGS: Brain: Generalized atrophy. Normal ventricular morphology. No midline shift or mass effect. Small vessel chronic ischemic changes of deep cerebral white matter. No intracranial hemorrhage, mass lesion, evidence of acute infarction, or extra-axial fluid collection. Vascular: No definite hyperdense vessels Skull: Intact Sinuses/Orbits: Clear Other: N/A IMPRESSION: Atrophy with small vessel chronic ischemic changes of deep cerebral white matter. No acute intracranial abnormalities. Electronically Signed   By: Lavonia Dana M.D.   On: 07/06/2018 23:10   Ct Angio Chest Pe W And/or Wo Contrast  Result Date: 06/19/2018 CLINICAL DATA:  Chest pain, shortness of breath, positive D-dimer EXAM: CT ANGIOGRAPHY CHEST WITH CONTRAST TECHNIQUE: Multidetector CT imaging of the chest was performed using the standard protocol during bolus administration of intravenous contrast. Multiplanar CT image reconstructions and MIPs were obtained to evaluate the vascular anatomy. CONTRAST:  144m OMNIPAQUE IOHEXOL 350 MG/ML SOLN COMPARISON:  07/12/2018 FINDINGS: Cardiovascular: Heart is normal size. Aorta is normal caliber. No filling defects in the pulmonary arteries to suggest pulmonary emboli. Mediastinum/Nodes: No mediastinal, hilar, or axillary adenopathy. Lungs/Pleura: Bibasilar atelectasis. Nodular density in  the superior segment of the left lower lobe lung the aortic arch posteriorly and medially measures 8 mm. No effusions. Upper Abdomen: Imaging into the upper abdomen shows no acute findings. Musculoskeletal: Chest wall soft tissues are unremarkable. No acute bony abnormality. Review of the MIP images confirms the above findings. IMPRESSION: No evidence of pulmonary embolus. Bibasilar atelectasis. 8 mm nodule in the medial superior segment of the left lower lobe adjacent to the aortic arch. Non-contrast chest CT at 6-12 months is recommended. If the nodule is stable at time of repeat CT, then future CT at 18-24 months (from today's scan) is considered optional for low-risk patients, but is recommended for high-risk patients. This recommendation follows the consensus statement: Guidelines for Management of Incidental Pulmonary Nodules Detected on CT Images: From the Fleischner Society 2017; Radiology 2017; 284:228-243. Electronically Signed   By: KRolm BaptiseM.D.   On: 06/19/2018 00:25   Dg Chest Port 1 View  Result Date: 07/09/2018 CLINICAL DATA:  Fever, sinus infection, headaches EXAM: PORTABLE CHEST 1 VIEW COMPARISON:  07/13/2012 FINDINGS: There is no focal parenchymal opacity. There is no pleural effusion or pneumothorax. The heart and mediastinal contours are unremarkable. The osseous structures are unremarkable. IMPRESSION: No active disease. Electronically Signed   By: HKathreen Devoid  On: 07/10/2018 20:59    EKG: Independently reviewed.  Sinus rhythm, borderline IVCD, early R wave transition.  Assessment/Plan Principal Problem:   Febrile illness Active Problems:   Migraine   Smoldering multiple myeloma (HCC)   Altered mental status   Depression with anxiety   Lung nodule seen on imaging study   Hyponatremia   Hypokalemia  DBren Borysis a 68y.o. female with medical history significant for IgG lambda smoldering myeloma, depression with anxiety, and migraines who is admitted with febrile  illness, encephalopathy, hyponatremia, and hypokalemia.   Febrile illness: No obvious source, suspect viral gastroenteritis with recent report of nausea and vomiting versus viral URI.  SARS-CoV-2 was negative.  Urinalysis negative for UTI and chest x-ray without pneumonia.  CTA chest also without obvious etiology.  Procalcitonin is <0.10 suggesting unlikely bacterial infection.  No meningismus on exam. -Continue supportive care for now -Follow-up blood cultures  Encephalopathy: Patient with recent hallucinations and increased confusion from her baseline.  Suspect multifactorial from febrile illness and possible medication side effect with hyponatremia. -Check TSH -Reduce home fluoxetine to 30 mg -Continue nortriptyline -note this may also cause hallucination -Delirium precautions  Hyponatremia: Potentially contributing to recent encephalopathy.  Suspect multifactorial from GI volume loss resulting in dehydration and side effect of fluoxetine, possible SIADH.  Patient received 2 L normal saline in the ED. -Check urine sodium and osmolality, serum osmolality -Continue maintenance IV fluids overnight -Recheck sodium in the a.m., goal is 131-133 over 24-hours.  Hypokalemia: -Repleted in the ED, recheck in a.m.  Smoldering multiple myeloma: Follows with oncology, Dr. Marin Olp.  Appears stable without hypercalcemia or renal dysfunction.  Depression with anxiety: -Decrease fluoxetine as above, continue nortriptyline for now  Deconditioning: Reportedly worsened generalized weakness at home and difficulty climbing stairs.  Likely exacerbated from febrile illness/encephalopathy. -PT/OT eval  Lung nodule seen on CT imaging: 8 mm nodule in the medial superior segment left lower lobe seen on CT scan.  Follow-up noncontrast chest CT at 6-12 months recommended.   DVT prophylaxis: Lovenox Code Status: DNR, discussed with patient's healthcare power of attorney [name] Family Communication:  discussed with patient's healthcare power of attorney [name]  Disposition Plan: Pending clinical progress Consults called: None Admission status: Observation   Zada Finders MD [facility] Pager [phone]  If 7PM-7AM, please contact night-coverage [email or web address]  06/19/2018, 2:15 AM

## 2018-06-19 NOTE — Progress Notes (Signed)
OT Cancellation Note  Patient Details Name: Nancy Parks MRN: [id number] DOB: [birth year]   Cancelled Treatment:    Reason Eval/Treat Not Completed: Fatigue/lethargy limiting ability to participate Met with pt attempting eval this date. Pt minimally responsive to commands, not arousing enough to safely participate in evaluation at this time. Various stimulating activities attempted such as cold wash cloth, lights, change in position without much change. OT will attempt next date as pt mental status and arousal improves.  Zenovia Jarred, MSOT, OTR/L Behavioral Health OT/ Acute Relief OT [facility] Office: [phone]  Zenovia Jarred 06/19/2018, 12:29 PM

## 2018-06-19 NOTE — ED Provider Notes (Signed)
Care assumed from Dr. Eulis Foster, patient with fever and altered mental status pending urinalysis and CT angiogram of chest.  These are both unremarkable other than incidental finding of a lung nodule.  Case is discussed with Dr. Posey Pronto of [facility], who agrees to admit the patient.  Results for orders placed or performed during the hospital encounter of 07/09/2018  SARS Coronavirus 2 [facility] order, Performed in [facility] hospital lab)  Result Value Ref Range   SARS Coronavirus 2 NEGATIVE NEGATIVE  Lactic acid, plasma  Result Value Ref Range   Lactic Acid, Venous 1.5 0.5 - 1.9 mmol/L  Lactic acid, plasma  Result Value Ref Range   Lactic Acid, Venous 0.7 0.5 - 1.9 mmol/L  CBC WITH DIFFERENTIAL  Result Value Ref Range   WBC 5.1 4.0 - 10.5 K/uL   RBC 3.33 (L) 3.87 - 5.11 MIL/uL   Hemoglobin 11.0 (L) 12.0 - 15.0 g/dL   HCT 31.9 (L) 36.0 - 46.0 %   MCV 95.8 80.0 - 100.0 fL   MCH 33.0 26.0 - 34.0 pg   MCHC 34.5 30.0 - 36.0 g/dL   RDW 13.5 11.5 - 15.5 %   Platelets 232 150 - 400 K/uL   nRBC 0.0 0.0 - 0.2 %   Neutrophils Relative % 65 %   Neutro Abs 3.3 1.7 - 7.7 K/uL   Lymphocytes Relative 19 %   Lymphs Abs 1.0 0.7 - 4.0 K/uL   Monocytes Relative 16 %   Monocytes Absolute 0.8 0.1 - 1.0 K/uL   Eosinophils Relative 0 %   Eosinophils Absolute 0.0 0.0 - 0.5 K/uL   Basophils Relative 0 %   Basophils Absolute 0.0 0.0 - 0.1 K/uL   Immature Granulocytes 0 %   Abs Immature Granulocytes 0.02 0.00 - 0.07 K/uL   Ovalocytes PRESENT   Comprehensive metabolic panel  Result Value Ref Range   Sodium 125 (L) 135 - 145 mmol/L   Potassium 2.6 (LL) 3.5 - 5.1 mmol/L   Chloride 91 (L) 98 - 111 mmol/L   CO2 24 22 - 32 mmol/L   Glucose, Bld 113 (H) 70 - 99 mg/dL   BUN 19 8 - 23 mg/dL   Creatinine, Ser 0.79 0.44 - 1.00 mg/dL   Calcium 8.2 (L) 8.9 - 10.3 mg/dL   Total Protein 6.8 6.5 - 8.1 g/dL   Albumin 3.7 3.5 - 5.0 g/dL   AST 25 15 - 41 U/L   ALT 16 0 - 44 U/L   Alkaline Phosphatase 78 38 -  126 U/L   Total Bilirubin 0.9 0.3 - 1.2 mg/dL   GFR calc non Af Amer >60 >60 mL/min   GFR calc Af Amer >60 >60 mL/min   Anion gap 10 5 - 15  D-dimer, quantitative  Result Value Ref Range   D-Dimer, Quant 0.96 (H) 0.00 - 0.50 ug/mL-FEU  Procalcitonin  Result Value Ref Range   Procalcitonin <0.10 ng/mL  Lactate dehydrogenase  Result Value Ref Range   LDH 192 98 - 192 U/L  Ferritin  Result Value Ref Range   Ferritin 68 11 - 307 ng/mL  Triglycerides  Result Value Ref Range   Triglycerides 106 <150 mg/dL  Fibrinogen  Result Value Ref Range   Fibrinogen 394 210 - 475 mg/dL  C-reactive protein  Result Value Ref Range   CRP <0.8 <1.0 mg/dL  Urinalysis, Routine w reflex microscopic  Result Value Ref Range   Color, Urine YELLOW YELLOW   APPearance CLEAR CLEAR   Specific Gravity,  Urine 1.024 1.005 - 1.030   pH 5.0 5.0 - 8.0   Glucose, UA 50 (A) NEGATIVE mg/dL   Hgb urine dipstick NEGATIVE NEGATIVE   Bilirubin Urine NEGATIVE NEGATIVE   Ketones, ur 20 (A) NEGATIVE mg/dL   Protein, ur 100 (A) NEGATIVE mg/dL   Nitrite NEGATIVE NEGATIVE   Leukocytes,Ua NEGATIVE NEGATIVE   RBC / HPF 0-5 0 - 5 RBC/hpf   WBC, UA 0-5 0 - 5 WBC/hpf   Bacteria, UA NONE SEEN NONE SEEN   Mucus PRESENT   Magnesium  Result Value Ref Range   Magnesium 2.0 1.7 - 2.4 mg/dL   Ct Head Wo Contrast  Result Date: 07/02/2018 CLINICAL DATA:  Altered level of consciousness, headache, fever, sinus infection, confusion, disorientation, history multiple myeloma EXAM: CT HEAD WITHOUT CONTRAST TECHNIQUE: Contiguous axial images were obtained from the base of the skull through the vertex without intravenous contrast. Sagittal and coronal MPR images reconstructed from axial data set. COMPARISON:  03/20/2015 Correlation: MR brain 06/14/2018 FINDINGS: Brain: Generalized atrophy. Normal ventricular morphology. No midline shift or mass effect. Small vessel chronic ischemic changes of deep cerebral white matter. No intracranial  hemorrhage, mass lesion, evidence of acute infarction, or extra-axial fluid collection. Vascular: No definite hyperdense vessels Skull: Intact Sinuses/Orbits: Clear Other: N/A IMPRESSION: Atrophy with small vessel chronic ischemic changes of deep cerebral white matter. No acute intracranial abnormalities. Electronically Signed   By: Lavonia Dana M.D.   On: 07/14/2018 23:10   Ct Angio Chest Pe W And/or Wo Contrast  Result Date: 06/19/2018 CLINICAL DATA:  Chest pain, shortness of breath, positive D-dimer EXAM: CT ANGIOGRAPHY CHEST WITH CONTRAST TECHNIQUE: Multidetector CT imaging of the chest was performed using the standard protocol during bolus administration of intravenous contrast. Multiplanar CT image reconstructions and MIPs were obtained to evaluate the vascular anatomy. CONTRAST:  13m OMNIPAQUE IOHEXOL 350 MG/ML SOLN COMPARISON:  07/12/2018 FINDINGS: Cardiovascular: Heart is normal size. Aorta is normal caliber. No filling defects in the pulmonary arteries to suggest pulmonary emboli. Mediastinum/Nodes: No mediastinal, hilar, or axillary adenopathy. Lungs/Pleura: Bibasilar atelectasis. Nodular density in the superior segment of the left lower lobe lung the aortic arch posteriorly and medially measures 8 mm. No effusions. Upper Abdomen: Imaging into the upper abdomen shows no acute findings. Musculoskeletal: Chest wall soft tissues are unremarkable. No acute bony abnormality. Review of the MIP images confirms the above findings. IMPRESSION: No evidence of pulmonary embolus. Bibasilar atelectasis. 8 mm nodule in the medial superior segment of the left lower lobe adjacent to the aortic arch. Non-contrast chest CT at 6-12 months is recommended. If the nodule is stable at time of repeat CT, then future CT at 18-24 months (from today's scan) is considered optional for low-risk patients, but is recommended for high-risk patients. This recommendation follows the consensus statement: Guidelines for Management of  Incidental Pulmonary Nodules Detected on CT Images: From the Fleischner Society 2017; Radiology 2017; 284:228-243. Electronically Signed   By: KRolm BaptiseM.D.   On: 06/19/2018 00:25   Mr BJeri CosWSVContrast  Result Date: 06/14/2018 CLINICAL DATA:  Frequent migraines. Associated hallucinations and incoordination. EXAM: MRI HEAD WITHOUT AND WITH CONTRAST TECHNIQUE: Multiplanar, multiecho pulse sequences of the brain and surrounding structures were obtained without and with intravenous contrast. CONTRAST:  176mMULTIHANCE GADOBENATE DIMEGLUMINE 529 MG/ML IV SOLN COMPARISON:  10/17/2017. FINDINGS: Brain: No evidence for acute infarction, hemorrhage, mass lesion, hydrocephalus, or extra-axial fluid. Normal cerebral volume. Mild to moderate subcortical and periventricular T2 and FLAIR hyperintensities, likely  chronic microvascular ischemic change. Post infusion, no abnormal enhancement of the brain or meninges. Vascular: Normal flow voids. Skull and upper cervical spine: Normal marrow signal. Sinuses/Orbits: Negative. Other: None. IMPRESSION: No acute intracranial findings. Mild to moderate white matter signal abnormality, likely chronic microvascular ischemic change. No abnormal postcontrast enhancement. No significant change compared with prior study in 2019. Electronically Signed   By: Staci Righter M.D.   On: 06/14/2018 16:41   Dg Chest Port 1 View  Result Date: 07/15/2018 CLINICAL DATA:  Fever, sinus infection, headaches EXAM: PORTABLE CHEST 1 VIEW COMPARISON:  07/13/2012 FINDINGS: There is no focal parenchymal opacity. There is no pleural effusion or pneumothorax. The heart and mediastinal contours are unremarkable. The osseous structures are unremarkable. IMPRESSION: No active disease. Electronically Signed   By: Kathreen Devoid   On: 07/05/2018 20:59   Images viewed by me.    Delora Fuel, MD 28/24/17 [phone]

## 2018-06-19 NOTE — Care Plan (Signed)
Discussed with HPOA patient had fever yesterday and multiple episodes of nausea and vomiting with diminished appetite.  She reports her being very disoriented and not being able to focus. She had intermittent periods of lucidness. She works as an Optometrist but was unable to remember her password or find the password she has written down. For the past several months she has had worsening coordination that started off as stumbling that progressed to difficulty even walking around the neighborhood on her own often having to hold onto her roommate for balance where prior to that her baseline she exercises on her own and even does yoga ( which is what prompted her outpatient doctors to obtain an MRI brain to ensure no new pathologic lesion). She even has to scoot down the stairs this week instead of being able to walk up and down the stairs. She believes she has been taking much more of the fluoxetine than actually prescribed.  Her visual hallucinations have been ongoing since January where she was seeing strange things in her bedroom while wearing her sleep mas ( her ex husband, a child, a cat, misplaced furniture). They became more frequent prompting her.  She has been having problems with her memory ( deficits also including language dysfunction, phonphobia for several years per chart review on Dr. Georgie Chard note from Neurology. History of ocular migraines since 2010.  She had neurocognitive testing by neuropsychologist done in June 2019 with no signs of neurodegenerative disease and presumed to be related to her migraines  Desiree Hane

## 2018-06-19 NOTE — Progress Notes (Addendum)
[facility] PROGRESS NOTE  Nancy Parks WGN:[id number] DOB: 08-May-1950 DOA: 07/16/2018 PCP: Hali Marry, MD  Assessment/Plan:   Febrile illness with worsening headache and acute onset confusion.  No nuchal rigidity present however given no other identifiable source and h/a with worsening confusion will obtain LP in case of meningitis will need to wait 24 hours since she had anticoagulation today.  Had recurrent fever this am so starting ceftriaxone, vancomycin, and ampicillin.Also add acyclovir in case of HSV  Discussed case with neurology Dr.Aroor who also queries serotonin syndrome though patient doesn't have hyperreflexia and only minimal cogwheel rigidity in her upper arms given she is on fluoxetine we will discontinue, CK elevated at 331, and continue IVF for supportive care .  However he is more concerned with potential thalamic involvement based on is view of her recent MRI (not read in findings on 4 /27) and advised high-dose thiamine for presumed Warnicke's encephalopathy.  Trying to get in touch with patient's healthcare power of attorney (called agreed) for further history.  Follow blood cultures and urine cultures.  Worsening migraines, discoordination and visual hallucinations, ongoing since March of this year.  Previously presumed to be side effect of fluoxetine however patient has not been following taper regimen (plan drop to milligrams a week).  MRI brain done on 4/27 no pathologic lesion only moderate subcortical and periventricular T2 flair hyperintensities presumed to be chronic microvascular ischemic change as mentioned above we will start high-dose thiamine due to thalamic vomiting concern for potential Wernicke's encephalopathy.    Electrolyte imbalances including hypovolemic hyponatremia and hypokalemia.  Presumed to be related to potential viral gastroenteritis which could explain fever.  Continue normal saline for hydration.  Closely monitor potassium with  repletion.  Not having any diarrhea here or nausea or vomiting sodium is improving now 130 up from 125, avoid increase greater than 10 mEq in the next 24 hours.  Palpable left neck mass.  Unclear significance.  Follow-up with neck ultrasound was unremarkable  Suppressed TSH.  Likely in setting of acute illness will follow T3 and T4.  Acute hypoxic respiratory failure.  Currently needing 2 L.  No lung pathology found on chest x-ray or CTA of chest.  Wean O2 as able.  Depression.  Continue home [place]   Code Status: FULL CODE Family Communication:  Per chart review chronically and his healthcare power of attorney, currently trying to find concentrations update family.  (indicate person spoken with, relationship, and if by phone, the number) Disposition Plan:  High-dose IV thiamine for presumed Wernicke's encephalopathy, awaiting reread of MRI brain,IV antibiotics for possible meningitis, plan for LP 24 hours after last anticoagulant, close monitoring monitoring cultures continue electrolyte repletion, close monitoring of sodium.   Consultants:  Neurology  Procedures:  none  Antibiotics:  none   HPI/Subjective:  Nancy Parks is a 68 y.o. year old female with medical history significant for IgG lambda smoldering myeloma, depression with anxiety, and migraines who presented on 06/21/2018 with 2 days of fevers, nausea, vomiting previously treated with Augmentin for presumed sinusitis/otitis media on 4/29 and was found to have  significant confusion with word finding difficulty, Sirs criteria and significant hyponatremia and hypokalemia.  In the ED febrile 102.4, tachypneic, tachycardic consistent with sepsis  Labwork significant for  TSH 0.223 Urine sodium 11, urinalysis in process Lactic acid unremarkable Magnesium within normal limits COVID testing negative WBC 5.1 Initial sodium of 125, potassium 2.6 D-dimer 0.6 Procalcitonin less than 0.10, LDH unremarkable, ferritin  unremarkable, triglyceride unremarkable, fibrinogen  unremarkable, CRP less than 0.8.  CTA negative for PE 8 mm nodule left lower lobe, CT scan 6 to 12 months CT head no acute intracranial normalities Chest x-ray no active disease Repleted with IV potassium in the ED given 2 L boluses of normal saline  Patient is unable to give any meaningful history given significant confusion/disorientation and word finding difficulties.  Per chart review patient was recently been tapered off of fluoxetine and has had 2 decreased visual hallucinations and migraine headaches.  Objective:     Vitals:   06/19/18 0249 06/19/18 0632  BP: (!) 141/85 (!) 143/82  Pulse: 94 99  Resp: (!) 22 20  Temp: 97.7 F (36.5 C) 99.7 F (37.6 C)  SpO2: 100% 98%    Intake/Output Summary (Last 24 hours) at 06/19/2018 0263 Last data filed at 06/19/2018 7858    Gross per 24 hour  Intake 1223.09 ml  Output -  Net 1223.09 ml      Filed Weights   06/19/18 0500  Weight: 88.9 kg    Exam:   General:   Elderly female, lying in bed in no distress  Cardiovascular:  Regular rate and rhythm, no peripheral edema  Respiratory:  Normal respiratory effort on 2 L  Abdomen:  Soft, nondistended, nontender  Musculoskeletal: Normal range of motion, no rigidity  Skin  no rashes or lesions  Neurologic  alert, not oriented to person, place, time.  Unable to answer questions in a meaningful way, able to move all extremities and follows simple commands  Data Reviewed: Basic Metabolic Panel: LastLabs       Recent Labs  Lab 07/15/2018 2048 07/15/2018 2317 06/19/18 0412  NA 125*  --  130*  K 2.6*  --  3.4*  CL 91*  --  98  CO2 24  --  24  GLUCOSE 113*  --  102*  BUN 19  --  15  CREATININE 0.79  --  0.62  CALCIUM 8.2*  --  7.7*  MG  --  2.0  --      Liver Function Tests: LastLabs     Recent Labs  Lab 07/13/2018 2048  AST 25  ALT 16  ALKPHOS 78  BILITOT 0.9  PROT 6.8  ALBUMIN 3.7      LastLabs  No results for input(s): LIPASE, AMYLASE in the last 168 hours.   LastLabs  No results for input(s): AMMONIA in the last 168 hours.   CBC: LastLabs      Recent Labs  Lab 07/03/2018 2048 06/19/18 0412  WBC 5.1 5.3  NEUTROABS 3.3  --   HGB 11.0* 11.3*  HCT 31.9* 34.0*  MCV 95.8 96.3  PLT 232 233     Cardiac Enzymes: LastLabs  No results for input(s): CKTOTAL, CKMB, CKMBINDEX, TROPONINI in the last 168 hours.   BNP (last 3 results) RecentLabs(withinlast365days)  No results for input(s): BNP in the last 8760 hours.    ProBNP (last 3 results) RecentLabs(withinlast365days)  No results for input(s): PROBNP in the last 8760 hours.    CBG: LastLabs  No results for input(s): GLUCAP in the last 168 hours.           Recent Results (from the past 240 hour(s))  SARS Coronavirus 2 [facility] order, Performed in [facility] hospital lab)     Status: None   Collection Time: 06/22/2018  8:48 PM  Result Value Ref Range Status   SARS Coronavirus 2 NEGATIVE NEGATIVE Final    Comment: (NOTE) If result is  NEGATIVE SARS-CoV-2 target nucleic acids are NOT DETECTED. The SARS-CoV-2 RNA is generally detectable in upper and lower  respiratory specimens during the acute phase of infection. The lowest  concentration of SARS-CoV-2 viral copies this assay can detect is 250  copies / mL. A negative result does not preclude SARS-CoV-2 infection  and should not be used as the sole basis for treatment or other  patient management decisions.  A negative result may occur with  improper specimen collection / handling, submission of specimen other  than nasopharyngeal swab, presence of viral mutation(s) within the  areas targeted by this assay, and inadequate number of viral copies  (<250 copies / mL). A negative result must be combined with clinical  observations, patient history, and epidemiological information. If result is POSITIVE SARS-CoV-2 target  nucleic acids are DETECTED. The SARS-CoV-2 RNA is generally detectable in upper and lower  respiratory specimens dur ing the acute phase of infection.  Positive  results are indicative of active infection with SARS-CoV-2.  Clinical  correlation with patient history and other diagnostic information is  necessary to determine patient infection status.  Positive results do  not rule out bacterial infection or co-infection with other viruses. If result is PRESUMPTIVE POSTIVE SARS-CoV-2 nucleic acids MAY BE PRESENT.   A presumptive positive result was obtained on the submitted specimen  and confirmed on repeat testing.  While 2019 novel coronavirus  (SARS-CoV-2) nucleic acids may be present in the submitted sample  additional confirmatory testing may be necessary for epidemiological  and / or clinical management purposes  to differentiate between  SARS-CoV-2 and other Sarbecovirus currently known to infect humans.  If clinically indicated additional testing with an alternate test  methodology [phone]) is advised. The SARS-CoV-2 RNA is generally  detectable in upper and lower respiratory sp ecimens during the acute  phase of infection. The expected result is Negative. Fact Sheet for Patients:  StrictlyIdeas.no Fact Sheet for Healthcare Providers: BankingDealers.co.za This test is not yet approved or cleared by the [place] FDA and has been authorized for detection and/or diagnosis of SARS-CoV-2 by FDA under an Emergency Use Authorization (EUA).  This EUA will remain in effect (meaning this test can be used) for the duration of the COVID-19 declaration under Section 564(b)(1) of the Act, 21 U.S.C. section 360bbb-3(b)(1), unless the authorization is terminated or revoked sooner. Performed at [facility], [place] [address] [place], [place] [address]      Studies:  ImagingResults(Last48hours)  Ct Head Wo  Contrast  Result Date: 07/09/2018 CLINICAL DATA:  Altered level of consciousness, headache, fever, sinus infection, confusion, disorientation, history multiple myeloma EXAM: CT HEAD WITHOUT CONTRAST TECHNIQUE: Contiguous axial images were obtained from the base of the skull through the vertex without intravenous contrast. Sagittal and coronal MPR images reconstructed from axial data set. COMPARISON:  03/20/2015 Correlation: MR brain 06/14/2018 FINDINGS: Brain: Generalized atrophy. Normal ventricular morphology. No midline shift or mass effect. Small vessel chronic ischemic changes of deep cerebral white matter. No intracranial hemorrhage, mass lesion, evidence of acute infarction, or extra-axial fluid collection. Vascular: No definite hyperdense vessels Skull: Intact Sinuses/Orbits: Clear Other: N/A IMPRESSION: Atrophy with small vessel chronic ischemic changes of deep cerebral white matter. No acute intracranial abnormalities. Electronically Signed   By: Lavonia Dana M.D.   On: 07/11/2018 23:10   Ct Angio Chest Pe W And/or Wo Contrast  Result Date: 06/19/2018 CLINICAL DATA:  Chest pain, shortness of breath, positive D-dimer EXAM: CT ANGIOGRAPHY CHEST WITH CONTRAST TECHNIQUE: Multidetector  CT imaging of the chest was performed using the standard protocol during bolus administration of intravenous contrast. Multiplanar CT image reconstructions and MIPs were obtained to evaluate the vascular anatomy. CONTRAST:  129m OMNIPAQUE IOHEXOL 350 MG/ML SOLN COMPARISON:  07/15/2018 FINDINGS: Cardiovascular: Heart is normal size. Aorta is normal caliber. No filling defects in the pulmonary arteries to suggest pulmonary emboli. Mediastinum/Nodes: No mediastinal, hilar, or axillary adenopathy. Lungs/Pleura: Bibasilar atelectasis. Nodular density in the superior segment of the left lower lobe lung the aortic arch posteriorly and medially measures 8 mm. No effusions. Upper Abdomen: Imaging into the upper abdomen shows no  acute findings. Musculoskeletal: Chest wall soft tissues are unremarkable. No acute bony abnormality. Review of the MIP images confirms the above findings. IMPRESSION: No evidence of pulmonary embolus. Bibasilar atelectasis. 8 mm nodule in the medial superior segment of the left lower lobe adjacent to the aortic arch. Non-contrast chest CT at 6-12 months is recommended. If the nodule is stable at time of repeat CT, then future CT at 18-24 months (from today's scan) is considered optional for low-risk patients, but is recommended for high-risk patients. This recommendation follows the consensus statement: Guidelines for Management of Incidental Pulmonary Nodules Detected on CT Images: From the Fleischner Society 2017; Radiology 2017; 284:228-243. Electronically Signed   By: KRolm BaptiseM.D.   On: 06/19/2018 00:25   Dg Chest Port 1 View  Result Date: 06/23/2018 CLINICAL DATA:  Fever, sinus infection, headaches EXAM: PORTABLE CHEST 1 VIEW COMPARISON:  07/13/2012 FINDINGS: There is no focal parenchymal opacity. There is no pleural effusion or pneumothorax. The heart and mediastinal contours are unremarkable. The osseous structures are unremarkable. IMPRESSION: No active disease. Electronically Signed   By: HKathreen Devoid  On: 07/03/2018 20:59     Scheduled Meds: . enoxaparin (LOVENOX) injection  40 mg Subcutaneous Daily  . [START ON 06/25/2018] FLUoxetine  20 mg Oral Daily   Followed by  . [START ON 07/02/2018] FLUoxetine  10 mg Oral Daily  . FLUoxetine  30 mg Oral Daily  . [START ON 06/20/2018] nortriptyline  75 mg Oral QHS  . sodium chloride flush  3 mL Intravenous Q12H   Continuous Infusions:  Principal Problem:   Febrile illness Active Problems:   Migraine   Smoldering multiple myeloma (HCC)   Altered mental status   Depression with anxiety   Lung nodule seen on imaging study   Hyponatremia   Hypokalemia      SDesiree Hane          [facility]

## 2018-06-19 NOTE — Consult Note (Addendum)
Requesting Physician: Dr. Teryl Lucy     Chief Complaint: Altered mental status, headache, hallucinations   History obtained from: Hospitalist and Chart review  HPI:                                                                                                                                       Nancy Parks is an 68 y.o. female with past medical history of multiple myeloma depression, migraines admitted to the medicine service [facility] after presenting with 2 days history of fevers, nausea and vomiting confusion.  She has been having hallucinations for roughly 1 week, and which was evaluated by her PCP and thought it was related to fluoxetine as well as complaining of headaches that have been worsening for a few weeks.  On admission she underwent MRI brain with and without contrast which showed no acute findings.  She does have some nonspecific periventricular white matter changes,  restriction diffusion in midbrain (likely artifact per radiology).  She also also has been febrile during this admission.  Blood cultures are still pending, UA negative for UTI.  No leukocytosis.  COVID-19 study negative.  CTA for PE also negative.  Neurology was consulted for evaluation of altered mental status and concern for meningitis/encephalitis.  History obtained by chart review as patient is too confused to respond. No seizure-like activity was seen in any point of time.  Past Medical History:  Diagnosis Date  . Depression   . Headache(784.0)    Migraines  . Leukopenia   . Leukopenia 07/13/2012  . Multiple myeloma, without mention of having achieved remission 07/13/2012  . 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 BASAL CELL CARCINOMA EXCISION  2013   removed from nose  . CATARACT EXTRACTION  09/2007  . LAPAROSCOPIC ENDOMETRIOSIS FULGURATION  1979    Family History  Problem Relation Age of Onset  . Breast cancer Other         grandmother   . Colon cancer Father        colon adenoma  . Heart attack Father   . Hyperlipidemia Sister   . Hypertension Sister   . Cancer Sister        throat, smoker  . Melanoma Sister   . Diabetes Sister   . CVA Mother   . Diabetes Brother    Social History:  reports that she has never smoked. She has never used smokeless tobacco. She reports that she does not drink alcohol or use drugs.  Allergies:  Allergies  Allergen Reactions  . Latex Rash    REACTION: Rash  REACTION: Rash  . Other Rash  . Tape Rash  . Fosamax [Alendronate Sodium] Other (See Comments)    GI upset    Medications:  I reviewed home medications  ROS:                                                                                                                                     14 systems reviewed and negative except above    Examination:                                                                                                      General: Appears well-developed and well-nourished. Obese  Psych: Altered, lethargic Eyes: No scleral injection HENT: No OP obstrucion Head: Normocephalic.  Cardiovascular: Normal rate and regular rhythm.  Respiratory: Effort normal and breath sounds normal to anterior ascultation GI: Soft.  No distension. There is no tenderness.  Skin: WDI    Neurological Examination Mental Status Lethargic, arouses to sternal rub and states her name but cannot answer any other question appropriately.  Mumbles incoherently.  Follows only few simple commands after asking repeatedly. Cranial Nerves: II: Visual fields : Blinks to threat bilaterally III,IV, VI: ptosis not present, extra-ocular motions intact bilaterally, pupils equal, round, reactive to light and accommodation V: Face appears symmetric XII: midline tongue extension Motor: Moves all 4  extremities purposefully with good strength.  Mild rigidity in both upper extremities. Tone and bulk:normal tone throughout; no atrophy noted Sensory: Difficult to assess patient's mental status.  Withdraws briskly to noxious stimulus bilaterally. Deep Tendon Reflexes: 2+ over biceps, ankle and patella and symmetric throughout.  Ankle clonus tested and absent. Plantars: Right: downgoing   Left: downgoing Cerebellar: Unable to test  Gait: unable to test     Lab Results: Basic Metabolic Panel: Recent Labs  Lab 06/21/2018 2048 07/01/2018 2317 06/19/18 0412 06/19/18 1315  NA 125*  --  130* 129*  K 2.6*  --  3.4* 3.1*  CL 91*  --  98 98  CO2 24  --  24 23  GLUCOSE 113*  --  102* 83  BUN 19  --  15 14  CREATININE 0.79  --  0.62 0.72  CALCIUM 8.2*  --  7.7* 7.8*  MG  --  2.0 2.0  --     CBC: Recent Labs  Lab 07/18/2018 2048 06/19/18 0412  WBC 5.1 5.3  NEUTROABS 3.3  --   HGB 11.0* 11.3*  HCT 31.9* 34.0*  MCV 95.8 96.3  PLT 232 233    Coagulation Studies: No results for input(s): LABPROT, INR in the last 72 hours.  Imaging: Ct Head Wo Contrast  Result  Date: 07/03/2018 CLINICAL DATA:  Altered level of consciousness, headache, fever, sinus infection, confusion, disorientation, history multiple myeloma EXAM: CT HEAD WITHOUT CONTRAST TECHNIQUE: Contiguous axial images were obtained from the base of the skull through the vertex without intravenous contrast. Sagittal and coronal MPR images reconstructed from axial data set. COMPARISON:  03/20/2015 Correlation: MR brain 06/14/2018 FINDINGS: Brain: Generalized atrophy. Normal ventricular morphology. No midline shift or mass effect. Small vessel chronic ischemic changes of deep cerebral white matter. No intracranial hemorrhage, mass lesion, evidence of acute infarction, or extra-axial fluid collection. Vascular: No definite hyperdense vessels Skull: Intact Sinuses/Orbits: Clear Other: N/A IMPRESSION: Atrophy with small vessel chronic  ischemic changes of deep cerebral white matter. No acute intracranial abnormalities. Electronically Signed   By: Lavonia Dana M.D.   On: 07/10/2018 23:10   Ct Angio Chest Pe W And/or Wo Contrast  Result Date: 06/19/2018 CLINICAL DATA:  Chest pain, shortness of breath, positive D-dimer EXAM: CT ANGIOGRAPHY CHEST WITH CONTRAST TECHNIQUE: Multidetector CT imaging of the chest was performed using the standard protocol during bolus administration of intravenous contrast. Multiplanar CT image reconstructions and MIPs were obtained to evaluate the vascular anatomy. CONTRAST:  129m OMNIPAQUE IOHEXOL 350 MG/ML SOLN COMPARISON:  07/09/2018 FINDINGS: Cardiovascular: Heart is normal size. Aorta is normal caliber. No filling defects in the pulmonary arteries to suggest pulmonary emboli. Mediastinum/Nodes: No mediastinal, hilar, or axillary adenopathy. Lungs/Pleura: Bibasilar atelectasis. Nodular density in the superior segment of the left lower lobe lung the aortic arch posteriorly and medially measures 8 mm. No effusions. Upper Abdomen: Imaging into the upper abdomen shows no acute findings. Musculoskeletal: Chest wall soft tissues are unremarkable. No acute bony abnormality. Review of the MIP images confirms the above findings. IMPRESSION: No evidence of pulmonary embolus. Bibasilar atelectasis. 8 mm nodule in the medial superior segment of the left lower lobe adjacent to the aortic arch. Non-contrast chest CT at 6-12 months is recommended. If the nodule is stable at time of repeat CT, then future CT at 18-24 months (from today's scan) is considered optional for low-risk patients, but is recommended for high-risk patients. This recommendation follows the consensus statement: Guidelines for Management of Incidental Pulmonary Nodules Detected on CT Images: From the Fleischner Society 2017; Radiology 2017; 284:228-243. Electronically Signed   By: KRolm BaptiseM.D.   On: 06/19/2018 00:25   [place] Tissue Head & Neck  (non-thyroid)  Result Date: 06/19/2018 CLINICAL DATA:  Neck mass lateral to trachea EXAM: ULTRASOUND OF HEAD/NECK SOFT TISSUES TECHNIQUE: Ultrasound examination of the head and neck soft tissues was performed in the area of clinical concern. COMPARISON:  None. FINDINGS: No mass, cyst, adenopathy, aneurysm, abscess, or other pathologic findings on survey interrogation of the region of concern and contralateral region. IMPRESSION: No pathologic findings on ultrasound. Electronically Signed   By: DLucrezia EuropeM.D.   On: 06/19/2018 12:28   Dg Chest Port 1 View  Result Date: 06/30/2018 CLINICAL DATA:  Fever, sinus infection, headaches EXAM: PORTABLE CHEST 1 VIEW COMPARISON:  07/13/2012 FINDINGS: There is no focal parenchymal opacity. There is no pleural effusion or pneumothorax. The heart and mediastinal contours are unremarkable. The osseous structures are unremarkable. IMPRESSION: No active disease. Electronically Signed   By: HKathreen Devoid  On: 07/09/2018 20:59     I have reviewed the above imaging:  Reviewed MRI Brain: non specific whiote matter disease, less likely MS, ? DWI restriction in midbrain - likely signal from corticospinal tract    ASSESSMENT AND PLAN  68  y.o. female with past medical history of multiple myeloma, depression scented with 2-day history of altered mental status, headaches, fever.  No obvious nuchal rigidity noted on examination.  Headaches may be from her history of migraine.  However given that she is still febrile with infectious work-up so far negative and is confused, I do think she warrants LP.  She is also immunocompromised due to her multiple myeloma and we shoudl definitely cover with broad symptom antibiotics and acyclovir.  I also ordered CK, serotonin syndrome is also a possibility however does not appear to be severe-with only mild rigidity noted on exam.   Impression:  Acute metabolic encephalopathy D/D : Viral encephalitis vs meningitis vs Serotonin syndrome vs  toxic/metabolic  Recommendations Stop All antidepressants CK - if elevated, favors SS - consider scheduled ativan Empric ab, acyclovir Consulted IR Dr patel, will do LP tomorrow - held lovenox Routine EEG  Frequent  neurochecks    Sushanth Aroor [facility] Pager Number [phone]

## 2018-06-19 NOTE — Progress Notes (Signed)
Pharmacy Antibiotic Note  Nancy Parks is a 68 y.o. female presented to the ED on 07/13/2018 with c/o fever, headache and AMS.  She recently started on Augmentin PTA for suspected sinus tract infection.  COVID test completed on admission was negative.  Neurologist consulted and differential includes viral encephalitis vs meningitis vs Serotonin syndrome.   Plan for LP by IR on 5/3 and to start broad abx with vancomycin, ceftriaxone, ampicillin and acyclovir for r/o meningitis.  Plan: - vanc 2000 mg IV x1, then 1250 mg q12h (est pk 36, tr 18) - CTX 2gm q12h per MD - ampicillin  2gm IV q4h per MD - acyclovir 890 mg q8h (10 mgkg q8h)   _______________________________________  Weight: 196 lb (88.9 kg)  Temp (24hrs), Avg:99.6 F (37.6 C), Min:97.7 F (36.5 C), Max:102.4 F (39.1 C)  Recent Labs  Lab 07/07/2018 2048 07/01/2018 2317 06/19/18 0412  WBC 5.1  --  5.3  CREATININE 0.79  --  0.62  LATICACIDVEN 1.5 0.7  --     Estimated Creatinine Clearance: 81.5 mL/min (by C-G formula based on SCr of 0.62 mg/dL).    Allergies  Allergen Reactions  . Latex Rash    REACTION: Rash  REACTION: Rash  . Other Rash  . Tape Rash  . Fosamax [Alendronate Sodium] Other (See Comments)    GI upset     Thank you for allowing pharmacy to be a part of this patient's care.  Lynelle Doctor 06/19/2018 2:06 PM

## 2018-06-19 NOTE — Progress Notes (Signed)
PT Cancellation Note  Patient Details Name: Eiman Maret MRN: [id number] DOB: Jun 13, 1950   Cancelled Treatment:    Reason Eval/Treat Not Completed: Fatigue/lethargy limiting ability to participate(unable to arouse pt with verbal/tactile stimulii. Will check back tomorrow. )   Philomena Doheny PT 06/19/2018  Ac[facility] Services Pager [phone] Office [phone]

## 2018-06-20 ENCOUNTER — Inpatient Hospital Stay (HOSPITAL_COMMUNITY): Payer: Medicare HMO

## 2018-06-20 ENCOUNTER — Encounter (HOSPITAL_COMMUNITY): Payer: Self-pay

## 2018-06-20 ENCOUNTER — Observation Stay (HOSPITAL_COMMUNITY): Payer: Medicare HMO

## 2018-06-20 DIAGNOSIS — Z888 Allergy status to other drugs, medicaments and biological substances status: Secondary | ICD-10-CM | POA: Diagnosis not present

## 2018-06-20 DIAGNOSIS — G43909 Migraine, unspecified, not intractable, without status migrainosus: Secondary | ICD-10-CM | POA: Diagnosis not present

## 2018-06-20 DIAGNOSIS — D72818 Other decreased white blood cell count: Secondary | ICD-10-CM | POA: Diagnosis not present

## 2018-06-20 DIAGNOSIS — F329 Major depressive disorder, single episode, unspecified: Secondary | ICD-10-CM | POA: Diagnosis not present

## 2018-06-20 DIAGNOSIS — Z9849 Cataract extraction status, unspecified eye: Secondary | ICD-10-CM | POA: Diagnosis not present

## 2018-06-20 DIAGNOSIS — Z931 Gastrostomy status: Secondary | ICD-10-CM | POA: Diagnosis not present

## 2018-06-20 DIAGNOSIS — Z4682 Encounter for fitting and adjustment of non-vascular catheter: Secondary | ICD-10-CM | POA: Diagnosis not present

## 2018-06-20 DIAGNOSIS — Z91040 Latex allergy status: Secondary | ICD-10-CM | POA: Diagnosis not present

## 2018-06-20 DIAGNOSIS — R0689 Other abnormalities of breathing: Secondary | ICD-10-CM | POA: Diagnosis not present

## 2018-06-20 DIAGNOSIS — J9690 Respiratory failure, unspecified, unspecified whether with hypoxia or hypercapnia: Secondary | ICD-10-CM | POA: Diagnosis not present

## 2018-06-20 DIAGNOSIS — R4182 Altered mental status, unspecified: Secondary | ICD-10-CM | POA: Diagnosis not present

## 2018-06-20 DIAGNOSIS — J9602 Acute respiratory failure with hypercapnia: Secondary | ICD-10-CM | POA: Diagnosis not present

## 2018-06-20 DIAGNOSIS — G9340 Encephalopathy, unspecified: Secondary | ICD-10-CM

## 2018-06-20 DIAGNOSIS — Z91048 Other nonmedicinal substance allergy status: Secondary | ICD-10-CM | POA: Diagnosis not present

## 2018-06-20 DIAGNOSIS — E860 Dehydration: Secondary | ICD-10-CM | POA: Diagnosis not present

## 2018-06-20 DIAGNOSIS — R401 Stupor: Secondary | ICD-10-CM | POA: Diagnosis not present

## 2018-06-20 DIAGNOSIS — Z85828 Personal history of other malignant neoplasm of skin: Secondary | ICD-10-CM | POA: Diagnosis not present

## 2018-06-20 DIAGNOSIS — J90 Pleural effusion, not elsewhere classified: Secondary | ICD-10-CM | POA: Diagnosis not present

## 2018-06-20 DIAGNOSIS — A86 Unspecified viral encephalitis: Secondary | ICD-10-CM | POA: Diagnosis not present

## 2018-06-20 DIAGNOSIS — C9000 Multiple myeloma not having achieved remission: Secondary | ICD-10-CM | POA: Diagnosis not present

## 2018-06-20 DIAGNOSIS — E871 Hypo-osmolality and hyponatremia: Secondary | ICD-10-CM | POA: Diagnosis not present

## 2018-06-20 DIAGNOSIS — Z66 Do not resuscitate: Secondary | ICD-10-CM | POA: Diagnosis not present

## 2018-06-20 DIAGNOSIS — R410 Disorientation, unspecified: Secondary | ICD-10-CM | POA: Diagnosis not present

## 2018-06-20 DIAGNOSIS — F418 Other specified anxiety disorders: Secondary | ICD-10-CM | POA: Diagnosis present

## 2018-06-20 DIAGNOSIS — R911 Solitary pulmonary nodule: Secondary | ICD-10-CM | POA: Diagnosis not present

## 2018-06-20 DIAGNOSIS — G9341 Metabolic encephalopathy: Secondary | ICD-10-CM | POA: Diagnosis not present

## 2018-06-20 DIAGNOSIS — R918 Other nonspecific abnormal finding of lung field: Secondary | ICD-10-CM | POA: Diagnosis not present

## 2018-06-20 DIAGNOSIS — Z8349 Family history of other endocrine, nutritional and metabolic diseases: Secondary | ICD-10-CM | POA: Diagnosis not present

## 2018-06-20 DIAGNOSIS — R836 Abnormal cytological findings in cerebrospinal fluid: Secondary | ICD-10-CM | POA: Diagnosis not present

## 2018-06-20 DIAGNOSIS — J9601 Acute respiratory failure with hypoxia: Secondary | ICD-10-CM | POA: Diagnosis present

## 2018-06-20 DIAGNOSIS — Z20828 Contact with and (suspected) exposure to other viral communicable diseases: Secondary | ICD-10-CM | POA: Diagnosis not present

## 2018-06-20 DIAGNOSIS — R0600 Dyspnea, unspecified: Secondary | ICD-10-CM | POA: Diagnosis not present

## 2018-06-20 DIAGNOSIS — R0602 Shortness of breath: Secondary | ICD-10-CM | POA: Diagnosis not present

## 2018-06-20 DIAGNOSIS — E222 Syndrome of inappropriate secretion of antidiuretic hormone: Secondary | ICD-10-CM | POA: Diagnosis not present

## 2018-06-20 DIAGNOSIS — E876 Hypokalemia: Secondary | ICD-10-CM | POA: Diagnosis not present

## 2018-06-20 DIAGNOSIS — R69 Illness, unspecified: Secondary | ICD-10-CM | POA: Diagnosis not present

## 2018-06-20 DIAGNOSIS — Z800 Family history of malignant neoplasm of digestive organs: Secondary | ICD-10-CM | POA: Diagnosis not present

## 2018-06-20 DIAGNOSIS — Z8249 Family history of ischemic heart disease and other diseases of the circulatory system: Secondary | ICD-10-CM | POA: Diagnosis not present

## 2018-06-20 DIAGNOSIS — R4020 Unspecified coma: Secondary | ICD-10-CM | POA: Diagnosis not present

## 2018-06-20 DIAGNOSIS — A419 Sepsis, unspecified organism: Secondary | ICD-10-CM | POA: Diagnosis not present

## 2018-06-20 DIAGNOSIS — Z803 Family history of malignant neoplasm of breast: Secondary | ICD-10-CM | POA: Diagnosis not present

## 2018-06-20 DIAGNOSIS — N1330 Unspecified hydronephrosis: Secondary | ICD-10-CM | POA: Diagnosis not present

## 2018-06-20 DIAGNOSIS — J9811 Atelectasis: Secondary | ICD-10-CM | POA: Diagnosis not present

## 2018-06-20 DIAGNOSIS — G0490 Encephalitis and encephalomyelitis, unspecified: Secondary | ICD-10-CM | POA: Diagnosis not present

## 2018-06-20 DIAGNOSIS — B004 Herpesviral encephalitis: Secondary | ICD-10-CM | POA: Diagnosis not present

## 2018-06-20 DIAGNOSIS — Z808 Family history of malignant neoplasm of other organs or systems: Secondary | ICD-10-CM | POA: Diagnosis not present

## 2018-06-20 DIAGNOSIS — R1110 Vomiting, unspecified: Secondary | ICD-10-CM | POA: Diagnosis not present

## 2018-06-20 DIAGNOSIS — R509 Fever, unspecified: Secondary | ICD-10-CM | POA: Diagnosis not present

## 2018-06-20 LAB — BLOOD GAS, ARTERIAL
Acid-base deficit: 2.1 mmol/L — ABNORMAL HIGH (ref 0.0–2.0)
Bicarbonate: 20 mmol/L (ref 20.0–28.0)
Drawn by: 295031
O2 Content: 2 L/min
O2 Saturation: 97.5 %
Patient temperature: 37
pCO2 arterial: 27.3 mmHg — ABNORMAL LOW (ref 32.0–48.0)
pH, Arterial: 7.478 — ABNORMAL HIGH (ref 7.350–7.450)
pO2, Arterial: 89.1 mmHg (ref 83.0–108.0)

## 2018-06-20 LAB — BASIC METABOLIC PANEL
Anion gap: 9 (ref 5–15)
Anion gap: 9 (ref 5–15)
BUN: 13 mg/dL (ref 8–23)
BUN: 14 mg/dL (ref 8–23)
CO2: 19 mmol/L — ABNORMAL LOW (ref 22–32)
CO2: 18 mmol/L — ABNORMAL LOW (ref 22–32)
Calcium: 7.1 mg/dL — ABNORMAL LOW (ref 8.9–10.3)
Calcium: 6.2 mg/dL — CL (ref 8.9–10.3)
Chloride: 104 mmol/L (ref 98–111)
Chloride: 99 mmol/L (ref 98–111)
Creatinine, Ser: 0.69 mg/dL (ref 0.44–1.00)
Creatinine, Ser: 0.92 mg/dL (ref 0.44–1.00)
GFR calc Af Amer: >60 mL/min (ref >60)
GFR calc Af Amer: >60 mL/min (ref >60)
GFR calc non Af Amer: >60 mL/min (ref >60)
GFR calc non Af Amer: >60 mL/min (ref >60)
Glucose, Bld: 82 mg/dL (ref 70–99)
Glucose, Bld: 419 mg/dL — ABNORMAL HIGH (ref 70–99)
Potassium: 2.3 mmol/L — CL (ref 3.5–5.1)
Potassium: 2.5 mmol/L — CL (ref 3.5–5.1)
Sodium: 132 mmol/L — ABNORMAL LOW (ref 135–145)
Sodium: 126 mmol/L — ABNORMAL LOW (ref 135–145)

## 2018-06-20 LAB — CBC
HCT: 32.1 % — ABNORMAL LOW (ref 36.0–46.0)
Hemoglobin: 10.5 g/dL — ABNORMAL LOW (ref 12.0–15.0)
MCH: 32 pg (ref 26.0–34.0)
MCHC: 32.7 g/dL (ref 30.0–36.0)
MCV: 97.9 fL (ref 80.0–100.0)
Platelets: 202 10*3/uL (ref 150–400)
RBC: 3.28 MIL/uL — ABNORMAL LOW (ref 3.87–5.11)
RDW: 13.5 % (ref 11.5–15.5)
WBC: 4.1 10*3/uL (ref 4.0–10.5)
nRBC: 0 % (ref 0.0–0.2)

## 2018-06-20 LAB — FIBRINOGEN: Fibrinogen: 428 mg/dL (ref 210–475)

## 2018-06-20 LAB — D-DIMER, QUANTITATIVE: D-Dimer, Quant: 2.91 ug/mL-FEU — ABNORMAL HIGH (ref 0.00–0.50)

## 2018-06-20 LAB — CSF CELL COUNT WITH DIFFERENTIAL
Lymphs, CSF: 70 % (ref 40–80)
Monocyte-Macrophage-Spinal Fluid: 23 % (ref 15–45)
RBC Count, CSF: 6 /mm3 — ABNORMAL HIGH
Segmented Neutrophils-CSF: 7 % — ABNORMAL HIGH (ref 0–6)
Tube #: 1
WBC, CSF: 44 /mm3 (ref 0–5)

## 2018-06-20 LAB — GLUCOSE, CAPILLARY
Glucose-Capillary: 69 mg/dL — ABNORMAL LOW (ref 70–99)
Glucose-Capillary: 71 mg/dL (ref 70–99)

## 2018-06-20 LAB — LACTATE DEHYDROGENASE: LDH: 248 U/L — ABNORMAL HIGH (ref 98–192)

## 2018-06-20 LAB — PROCALCITONIN: Procalcitonin: 0.44 ng/mL

## 2018-06-20 LAB — BRAIN NATRIURETIC PEPTIDE: B Natriuretic Peptide: 73.3 pg/mL (ref 0.0–100.0)

## 2018-06-20 LAB — INFLUENZA PANEL BY PCR (TYPE A & B)
Influenza A By PCR: NEGATIVE
Influenza B By PCR: NEGATIVE

## 2018-06-20 LAB — CK
Total CK: 465 U/L — ABNORMAL HIGH (ref 38–234)
Total CK: 602 U/L — ABNORMAL HIGH (ref 38–234)

## 2018-06-20 LAB — SARS CORONAVIRUS 2 BY RT PCR (HOSPITAL ORDER, PERFORMED IN ~~LOC~~ HOSPITAL LAB): SARS Coronavirus 2: NEGATIVE

## 2018-06-20 LAB — PROTEIN, CSF: Total  Protein, CSF: 126 mg/dL — ABNORMAL HIGH (ref 15–45)

## 2018-06-20 LAB — GLUCOSE, CSF: Glucose, CSF: 39 mg/dL — ABNORMAL LOW (ref 40–70)

## 2018-06-20 LAB — TRIGLYCERIDES: Triglycerides: 70 mg/dL (ref <150)

## 2018-06-20 LAB — ABO/RH: ABO/RH(D): O POS

## 2018-06-20 LAB — SEDIMENTATION RATE: Sed Rate: 19 mm/hr (ref 0–22)

## 2018-06-20 LAB — FERRITIN: Ferritin: 123 ng/mL (ref 11–307)

## 2018-06-20 LAB — TROPONIN I: Troponin I: 0.03 ng/mL (ref <0.03)

## 2018-06-20 LAB — C-REACTIVE PROTEIN: CRP: 6.5 mg/dL — ABNORMAL HIGH (ref <1.0)

## 2018-06-20 LAB — AMMONIA: Ammonia: 18 umol/L (ref 9–35)

## 2018-06-20 LAB — T3: T3, Total: 52 ng/dL — ABNORMAL LOW (ref 71–180)

## 2018-06-20 MED ORDER — LIDOCAINE HCL 1 % IJ SOLN
INTRAMUSCULAR | Status: AC
  Filled 2018-06-20: qty 20

## 2018-06-20 MED ORDER — DEXTROSE-NACL 5-0.45 % IV SOLN
INTRAVENOUS | Status: DC
  Administered 2018-06-20: 17:00:00 via INTRAVENOUS

## 2018-06-20 MED ORDER — SODIUM CHLORIDE 0.9 % IV SOLN
2.0000 g | Freq: Three times a day (TID) | INTRAVENOUS | Status: DC
  Filled 2018-06-20 (×2): qty 2

## 2018-06-20 MED ORDER — POTASSIUM CHLORIDE 10 MEQ/100ML IV SOLN
10.0000 meq | INTRAVENOUS | Status: AC
  Administered 2018-06-20 – 2018-06-21 (×6): 10 meq via INTRAVENOUS
  Filled 2018-06-20 (×6): qty 100

## 2018-06-20 MED ORDER — INSULIN ASPART 100 UNIT/ML ~~LOC~~ SOLN: 0.0000 [IU] | Freq: Three times a day (TID) | SUBCUTANEOUS | Status: DC

## 2018-06-20 MED ORDER — IOHEXOL 300 MG/ML  SOLN
100.0000 mL | Freq: Once | INTRAMUSCULAR | Status: AC | PRN
  Administered 2018-06-21: 100 mL via INTRAVENOUS

## 2018-06-20 MED ORDER — POTASSIUM CHLORIDE 10 MEQ/100ML IV SOLN
10.0000 meq | INTRAVENOUS | Status: AC
  Administered 2018-06-20 (×6): 10 meq via INTRAVENOUS
  Filled 2018-06-20 (×6): qty 100

## 2018-06-20 MED ORDER — SODIUM CHLORIDE (PF) 0.9 % IJ SOLN
INTRAMUSCULAR | Status: AC
  Filled 2018-06-20: qty 50

## 2018-06-20 MED ORDER — SODIUM CHLORIDE 0.9 % IV SOLN
INTRAVENOUS | Status: AC
  Administered 2018-06-20 – 2018-06-21 (×2): via INTRAVENOUS

## 2018-06-20 MED ORDER — DEXTROSE 50 % IV SOLN
INTRAVENOUS | Status: AC
  Filled 2018-06-20: qty 50

## 2018-06-20 MED ORDER — SODIUM CHLORIDE 0.9 % IV BOLUS
500.0000 mL | Freq: Once | INTRAVENOUS | Status: AC
  Administered 2018-06-20: 500 mL via INTRAVENOUS

## 2018-06-20 MED ORDER — KETOROLAC TROMETHAMINE 30 MG/ML IJ SOLN
15.0000 mg | Freq: Once | INTRAMUSCULAR | Status: AC
  Administered 2018-06-20: 15 mg via INTRAVENOUS
  Filled 2018-06-20: qty 1

## 2018-06-20 MED ORDER — SODIUM CHLORIDE 0.9 % IV SOLN
2.0000 g | INTRAVENOUS | Status: DC
  Administered 2018-06-20 – 2018-06-23 (×16): 2 g via INTRAVENOUS
  Filled 2018-06-20: qty 2
  Filled 2018-06-20: qty 2000
  Filled 2018-06-20: qty 2
  Filled 2018-06-20 (×2): qty 2000
  Filled 2018-06-20 (×8): qty 2
  Filled 2018-06-20: qty 2000
  Filled 2018-06-20 (×5): qty 2

## 2018-06-20 NOTE — Progress Notes (Signed)
Report given to SD RN- pt to transfer to 1228. Night RN will transfer pt. SRP, RN

## 2018-06-20 NOTE — Care Management Obs Status (Addendum)
[facility] NOTIFICATION   Patient Details  Name: Nancy Parks MRN: [id number] Date of Birth: Apr 12, 1950   Medicare Observation Status Notification Given:  Yes    Erenest Rasher, RN 06/20/2018, 9:54 AM

## 2018-06-20 NOTE — Progress Notes (Signed)
MEWS continue at level 4, MD updated of elevated temp. Will cont VS q 4hr per protocol. Tylendol given for temp and blood cultures ordered. SRP, RN

## 2018-06-20 NOTE — Progress Notes (Signed)
Interim note  Reviewed CSF findings -cell count 44 predominantly lymphocytes, protein elevated at 126.  Glucose borderline low at 39.  Indicated on viral encephalitis, however patient immunocompromised and should consider Listeria, possible fungal infection.  Recommended ID consult-cultures pending.  HSV PCR pending.  Agree to continue acyclovir and ampicillin.  Routine EEG pending.

## 2018-06-20 NOTE — Progress Notes (Signed)
Pt transported to radiology for LP procedure. SRP, RN

## 2018-06-20 NOTE — Progress Notes (Signed)
Pt. Is experiencing spells of apnea. On-call notified. Will continue to monitor.

## 2018-06-20 NOTE — Progress Notes (Signed)
Pt swabbed for COVID-19, resp panel, Influenza panel. Pt restless, unable to follow commands, open eyes briefly. VS noted, MD made aware and reassessed pt. Pt will tranfer to SD with pending COVID-19. O2 2l sats 96 %. Family updated by MD of current status. Will cont to monitor. K+ started. SRP, RN

## 2018-06-20 NOTE — Progress Notes (Signed)
Pharmacy Antibiotic Note  Nancy Parks is a 68 y.o. female presented to the ED on 07/02/2018 with c/o fever, headache and AMS. She recently started on Augmentin PTA for suspected sinus tract infection. COVID test completed on admission was negative. Neurologist consulted and differential includes viral encephalitis vs meningitis vs Serotonin syndrome. Plan for LP by IR on 5/3 and to start broad abx with vancomycin, ceftriaxone, ampicillin and acyclovir for r/o meningitis.  Plan:  Cefepime 2g IV q8 hr Continue other abx as currently ordered:  vanc 1250 mg q12h (est pk 36, tr 18)  ampicillin  2gm IV q4h per MD  acyclovir 890 mg q8h (10 mgkg q8h)   _______________________________________  Weight: 203 lb 4.2 oz (92.2 kg)  Temp (24hrs), Avg:100.7 F (38.2 C), Min:98.7 F (37.1 C), Max:103.2 F (39.6 C)  Recent Labs  Lab 06/26/2018 2048 07/18/2018 2317 06/19/18 0412 06/19/18 1315 06/20/18 0510  WBC 5.1  --  5.3  --  4.1  CREATININE 0.79  --  0.62 0.72 0.69  LATICACIDVEN 1.5 0.7  --   --   --     Estimated Creatinine Clearance: 82.9 mL/min (by C-G formula based on SCr of 0.69 mg/dL).    Allergies  Allergen Reactions  . Latex Rash    REACTION: Rash  REACTION: Rash  . Other Rash  . Tape Rash  . Fosamax [Alendronate Sodium] Other (See Comments)    GI upset     Thank you for allowing pharmacy to be a part of this patient's care.  Vinayak Bobier A 06/20/2018 4:46 PM

## 2018-06-20 NOTE — Progress Notes (Addendum)
Spoke with infection prevention Ester.. Pt PPE -droplet due to r/o suspected/probable Meningitis. SRP, RN

## 2018-06-20 NOTE — Progress Notes (Signed)
PT Cancellation Note  Patient Details Name: Nancy Parks MRN: [id number] DOB: [birth year]   Cancelled Treatment:    Reason Eval/Treat Not Completed: Patient at procedure or test/unavailable(LP)   [facility] 06/20/2018, 1:56 PM

## 2018-06-20 NOTE — Progress Notes (Signed)
CBG taken incorrectly the first time, and read 69. Taken correctly a second time, within 10 minutes and read 71. Administering acyclovir in dextrose 5%. Will retake blood glucose in an hour.   Jarrett Soho, RN

## 2018-06-20 NOTE — Progress Notes (Addendum)
[facility] PROGRESS NOTE  Nancy Parks RCV:[id number] DOB: Jan 11, 1951 DOA: 07/14/2018 PCP: Hali Marry, MD  Assessment/Plan:   Febrile illness with worsening headache and acute onset confusion concern for encephalitis or meningitis. Lymphocytic predominance on CSF with WBC of 44 concerning for viral etiology of. Will discontinue further antibiotic coverage for meningitis therapy ( vancomycin, ceftriaxone) and continue acyclovir. Will keep on ampicillin given Listeria can also cause lymphocytic predominance. Follow CSF culture ( Lp on 5/3).   Discussed with patient's healthcare power of attorney poor clinical status and concern for needing higher level of care and potentially intubation family agreeable to intubation if needed for respiratory compromise. Otherwise if patient has cardiac arrest they do no want resuscitation.   Acute hypoxic respiratory failure.  Currently needing 2 L.  No lung pathology found on chest x-ray or CTA of chest on admission but repeat CXR today shows bilateral opacities that could be pneumonia or atelectasis. Will resend COVID to [employer] given persistent fever though very likely all from above due to ID recommendations.   Wean O2 as able.Trend ferritin, LDH, triglycerides, CRP, check RVP, strep pneumo, Legionella, sputum  Worsening migraines, discoordination, memory deficits, and visual hallucinations ongoing for months.    Initially was attributed to fluoxetine as an outpatient and patient did not follow taper regimen.  CT scan here shows no acute abnormalities and  MRI brain done on 4/27 no pathologic lesion only moderate subcortical and periventricular T2 flair hyperintensities presumed to be chronic microvascular ischemic change.   Follow-up LP fluid analysis, and therapy as above.   Electrolyte imbalances including hypovolemic hyponatremia and hypokalemia, not improving.   Continue IVF ( normal saline) to improve sodium,  Check mag, closely monitor with  q 8 hr BMP,Continue to replete potassium, .   Palpable left neck mass.   Follow-up with neck ultrasound was unremarkable  Suppressed TSH.  Likely in setting of acute illness.  T3 depressed, T4 within normal limits.  Depression.  Continue home [place]  Smoldering IgG lambda Multiple Myeloma, stable Followed by Dr. Marin Olp as outpatient  Incidental 8 mm Pulmonary nodule Found on CTA. Will need outpatient CT scan 6 to 12 months   Code Status: DNR  Family Communication:  discussed with Kem Kays at [phone] they would like for her be intubated if her respiratory status worsens but if she has cardiac arrest they do not want resuscitation ( including no CPR or breathing machine at that time) she will remain DNR   Disposition Plan:  acyclovir and ampicillin for meningitic presentation while awaiting CSF culture, transfer to stepdown for closer monitoring of respiratory status/mentation/ability to protect airway  Consultants:  Neurology  Procedures:  none  Antibiotics:  none   HPI/Subjective:  Nancy Parks is a 68 y.o. year old female with medical history significant for IgG lambda smoldering myeloma, depression with anxiety, and migraines who presented on 06/30/2018 with 2 days of fevers, nausea, vomiting previously treated with Augmentin for presumed sinusitis/otitis media on 4/29 and was found to have  fever, significant confusion with word finding difficulty, significant hyponatremia and hypokalemia initially presumed to be related to viral gastroenteritis on admission. In the ED her COVID rapid test was negative. Given elevated d-dimer CTA was obtained, negative for PE.  Given persistent fever and confusion treated empirically for presumed meningitis with vancomycin, ceftriaxone, ampicillin, and acyclovir. LP obtained on 5/3 showed lymphocytic predominant CSF so all antibacterial therapy was discontinued except ampicillin and acyclovir therapy.   Patient has  continued  to spike fevers with Tmax of 103 today. She remains confused with inability to converse at all and only grunting, moving extremities and occasionally opening eyes to voice.   Most of her care has been communicated to her HCPOA and friend [name] )  Patient is unable to give any meaningful history given significant confusion/disorientation   Objective:     Vitals:   06/19/18 0249 06/19/18 0632  BP: (!) 141/85 (!) 143/82  Pulse: 94 99  Resp: (!) 22 20  Temp: 97.7 F (36.5 C) 99.7 F (37.6 C)  SpO2: 100% 98%    Intake/Output Summary (Last 24 hours) at 06/19/2018 7353 Last data filed at 06/19/2018 2992    Gross per 24 hour  Intake 1223.09 ml  Output -  Net 1223.09 ml      Filed Weights   06/19/18 0500  Weight: 88.9 kg    Exam:   General:   Elderly female, lying in bed in no distress  Cardiovascular:  Regular rate and rhythm, no peripheral edema  Respiratory:  Mouth breathing on 2 L  Abdomen:  Soft, nondistended, withdrawing with palpation of abdomen  Musculoskeletal: Normal range of motion, no rigidity  Skin  no rashes or lesions  Neurologic grunts to voice, nonpurposeful movements,  not oriented to person, place, time.  Unable to answer questions or follow simple commands  Data Reviewed: Basic Metabolic Panel: LastLabs       Recent Labs  Lab 06/21/2018 2048 07/06/2018 2317 06/19/18 0412  NA 125*  --  130*  K 2.6*  --  3.4*  CL 91*  --  98  CO2 24  --  24  GLUCOSE 113*  --  102*  BUN 19  --  15  CREATININE 0.79  --  0.62  CALCIUM 8.2*  --  7.7*  MG  --  2.0  --      Liver Function Tests: LastLabs     Recent Labs  Lab 07/15/2018 2048  AST 25  ALT 16  ALKPHOS 78  BILITOT 0.9  PROT 6.8  ALBUMIN 3.7     LastLabs  No results for input(s): LIPASE, AMYLASE in the last 168 hours.   LastLabs  No results for input(s): AMMONIA in the last 168 hours.   CBC: LastLabs      Recent Labs  Lab  07/09/2018 2048 06/19/18 0412  WBC 5.1 5.3  NEUTROABS 3.3  --   HGB 11.0* 11.3*  HCT 31.9* 34.0*  MCV 95.8 96.3  PLT 232 233     Cardiac Enzymes: LastLabs  No results for input(s): CKTOTAL, CKMB, CKMBINDEX, TROPONINI in the last 168 hours.   BNP (last 3 results) RecentLabs(withinlast365days)  No results for input(s): BNP in the last 8760 hours.    ProBNP (last 3 results) RecentLabs(withinlast365days)  No results for input(s): PROBNP in the last 8760 hours.    CBG: LastLabs  No results for input(s): GLUCAP in the last 168 hours.           Recent Results (from the past 240 hour(s))  SARS Coronavirus 2 [facility] order, Performed in [place] hospital lab)     Status: None   Collection Time: 07/14/2018  8:48 PM  Result Value Ref Range Status   SARS Coronavirus 2 NEGATIVE NEGATIVE Final    Comment: (NOTE) If result is NEGATIVE SARS-CoV-2 target nucleic acids are NOT DETECTED. The SARS-CoV-2 RNA is generally detectable in upper and lower  respiratory specimens during the acute phase of infection. The  lowest  concentration of SARS-CoV-2 viral copies this assay can detect is 250  copies / mL. A negative result does not preclude SARS-CoV-2 infection  and should not be used as the sole basis for treatment or other  patient management decisions.  A negative result may occur with  improper specimen collection / handling, submission of specimen other  than nasopharyngeal swab, presence of viral mutation(s) within the  areas targeted by this assay, and inadequate number of viral copies  (<250 copies / mL). A negative result must be combined with clinical  observations, patient history, and epidemiological information. If result is POSITIVE SARS-CoV-2 target nucleic acids are DETECTED. The SARS-CoV-2 RNA is generally detectable in upper and lower  respiratory specimens dur ing the acute phase of infection.  Positive  results are indicative of active  infection with SARS-CoV-2.  Clinical  correlation with patient history and other diagnostic information is  necessary to determine patient infection status.  Positive results do  not rule out bacterial infection or co-infection with other viruses. If result is PRESUMPTIVE POSTIVE SARS-CoV-2 nucleic acids MAY BE PRESENT.   A presumptive positive result was obtained on the submitted specimen  and confirmed on repeat testing.  While 2019 novel coronavirus  (SARS-CoV-2) nucleic acids may be present in the submitted sample  additional confirmatory testing may be necessary for epidemiological  and / or clinical management purposes  to differentiate between  SARS-CoV-2 and other Sarbecovirus currently known to infect humans.  If clinically indicated additional testing with an alternate test  methodology [id number]) is advised. The SARS-CoV-2 RNA is generally  detectable in upper and lower respiratory sp ecimens during the acute  phase of infection. The expected result is Negative. Fact Sheet for Patients:  StrictlyIdeas.no Fact Sheet for Healthcare Providers: BankingDealers.co.za This test is not yet approved or cleared by the [place] FDA and has been authorized for detection and/or diagnosis of SARS-CoV-2 by FDA under an Emergency Use Authorization (EUA).  This EUA will remain in effect (meaning this test can be used) for the duration of the COVID-19 declaration under Section 564(b)(1) of the Act, 21 U.S.C. section 360bbb-3(b)(1), unless the authorization is terminated or revoked sooner. Performed at [facility], [place] [address], [place], [place] [address]      Studies:  ImagingResults(Last48hours)  Ct Head Wo Contrast  Result Date: 07/02/2018 CLINICAL DATA:  Altered level of consciousness, headache, fever, sinus infection, confusion, disorientation, history multiple myeloma EXAM: CT HEAD WITHOUT CONTRAST  TECHNIQUE: Contiguous axial images were obtained from the base of the skull through the vertex without intravenous contrast. Sagittal and coronal MPR images reconstructed from axial data set. COMPARISON:  03/20/2015 Correlation: MR brain 06/14/2018 FINDINGS: Brain: Generalized atrophy. Normal ventricular morphology. No midline shift or mass effect. Small vessel chronic ischemic changes of deep cerebral white matter. No intracranial hemorrhage, mass lesion, evidence of acute infarction, or extra-axial fluid collection. Vascular: No definite hyperdense vessels Skull: Intact Sinuses/Orbits: Clear Other: N/A IMPRESSION: Atrophy with small vessel chronic ischemic changes of deep cerebral white matter. No acute intracranial abnormalities. Electronically Signed   By: Lavonia Dana M.D.   On: 07/18/2018 23:10   Ct Angio Chest Pe W And/or Wo Contrast  Result Date: 06/19/2018 CLINICAL DATA:  Chest pain, shortness of breath, positive D-dimer EXAM: CT ANGIOGRAPHY CHEST WITH CONTRAST TECHNIQUE: Multidetector CT imaging of the chest was performed using the standard protocol during bolus administration of intravenous contrast. Multiplanar CT image reconstructions and MIPs were obtained to evaluate the  vascular anatomy. CONTRAST:  147m OMNIPAQUE IOHEXOL 350 MG/ML SOLN COMPARISON:  06/20/2018 FINDINGS: Cardiovascular: Heart is normal size. Aorta is normal caliber. No filling defects in the pulmonary arteries to suggest pulmonary emboli. Mediastinum/Nodes: No mediastinal, hilar, or axillary adenopathy. Lungs/Pleura: Bibasilar atelectasis. Nodular density in the superior segment of the left lower lobe lung the aortic arch posteriorly and medially measures 8 mm. No effusions. Upper Abdomen: Imaging into the upper abdomen shows no acute findings. Musculoskeletal: Chest wall soft tissues are unremarkable. No acute bony abnormality. Review of the MIP images confirms the above findings. IMPRESSION: No evidence of pulmonary embolus.  Bibasilar atelectasis. 8 mm nodule in the medial superior segment of the left lower lobe adjacent to the aortic arch. Non-contrast chest CT at 6-12 months is recommended. If the nodule is stable at time of repeat CT, then future CT at 18-24 months (from today's scan) is considered optional for low-risk patients, but is recommended for high-risk patients. This recommendation follows the consensus statement: Guidelines for Management of Incidental Pulmonary Nodules Detected on CT Images: From the Fleischner Society 2017; Radiology 2017; 284:228-243. Electronically Signed   By: KRolm BaptiseM.D.   On: 06/19/2018 00:25   Dg Chest Port 1 View  Result Date: 07/10/2018 CLINICAL DATA:  Fever, sinus infection, headaches EXAM: PORTABLE CHEST 1 VIEW COMPARISON:  07/13/2012 FINDINGS: There is no focal parenchymal opacity. There is no pleural effusion or pneumothorax. The heart and mediastinal contours are unremarkable. The osseous structures are unremarkable. IMPRESSION: No active disease. Electronically Signed   By: HKathreen Devoid  On: 07/03/2018 20:59     Scheduled Meds: . enoxaparin (LOVENOX) injection  40 mg Subcutaneous Daily  . [START ON 06/25/2018] FLUoxetine  20 mg Oral Daily   Followed by  . [START ON 07/02/2018] FLUoxetine  10 mg Oral Daily  . FLUoxetine  30 mg Oral Daily  . [START ON 06/20/2018] nortriptyline  75 mg Oral QHS  . sodium chloride flush  3 mL Intravenous Q12H   Continuous Infusions:  Principal Problem:   Febrile illness Active Problems:   Migraine   Smoldering multiple myeloma (HCC)   Altered mental status   Depression with anxiety   Lung nodule seen on imaging study   Hyponatremia   Hypokalemia      SDesiree Hane          [facility]

## 2018-06-20 NOTE — Progress Notes (Signed)
CRITICAL VALUE ALERT  Critical Value: Potassium 2.5  Date & Time Notied: 06/20/2018   1712  Provider Notified: Dr. Lisbeth Ply  Orders Received/Actions taken: 6 RUNs of K+ ordered     CRITICAL VALUE ALERT  Critical Value:  Calcium 6.2  Date & Time Notied:  06/20/2018    1712  Provider Notified: Dr. Lisbeth Ply   Orders Received/Actions taken:    SRP, RN

## 2018-06-20 NOTE — Progress Notes (Signed)
OT Cancellation Note  Patient Details Name: Guy Toney MRN: [id number] DOB: 05-10-1950   Cancelled Treatment:    Reason Eval/Treat Not Completed: Other (comment). Spoke to [employer]. Pt is not ready for OT at this time. She is not responding well to RN and sleeping/snoring hard.  Pt has not gotten K+ yet (2.3).  RN reports that pt will have a busy day today. Will check back tomorrow.  Stetson Pelaez 06/20/2018, 8:49 AM  Lesle Chris, OTR/L Acute Rehabilitation Services [phone] [facility] pager [phone] office 06/20/2018

## 2018-06-20 NOTE — Procedures (Signed)
Preprocedure Dx: AMS, fever Postprocedure Dx: AMS, fever Procedure:  Fluoroscopically guided lumbar puncture Radiologist:  Thornton Papas Anesthesia:  4 ml of 1% lidocaine Specimen:  9 ml CSF, clear colorless EBL:   < 1 ml Opening pressure: 15.5 cm H2O *measureed prone due to patient condition) Complications: None

## 2018-06-21 ENCOUNTER — Inpatient Hospital Stay (HOSPITAL_COMMUNITY): Payer: Medicare HMO

## 2018-06-21 DIAGNOSIS — N1330 Unspecified hydronephrosis: Secondary | ICD-10-CM | POA: Clinically undetermined

## 2018-06-21 DIAGNOSIS — Z85828 Personal history of other malignant neoplasm of skin: Secondary | ICD-10-CM

## 2018-06-21 DIAGNOSIS — Z808 Family history of malignant neoplasm of other organs or systems: Secondary | ICD-10-CM

## 2018-06-21 DIAGNOSIS — R4020 Unspecified coma: Secondary | ICD-10-CM

## 2018-06-21 DIAGNOSIS — E876 Hypokalemia: Secondary | ICD-10-CM

## 2018-06-21 DIAGNOSIS — F418 Other specified anxiety disorders: Secondary | ICD-10-CM

## 2018-06-21 DIAGNOSIS — Z803 Family history of malignant neoplasm of breast: Secondary | ICD-10-CM

## 2018-06-21 DIAGNOSIS — Z800 Family history of malignant neoplasm of digestive organs: Secondary | ICD-10-CM

## 2018-06-21 DIAGNOSIS — D72818 Other decreased white blood cell count: Secondary | ICD-10-CM

## 2018-06-21 DIAGNOSIS — R911 Solitary pulmonary nodule: Secondary | ICD-10-CM

## 2018-06-21 DIAGNOSIS — R509 Fever, unspecified: Secondary | ICD-10-CM

## 2018-06-21 LAB — BASIC METABOLIC PANEL
Anion gap: 10 (ref 5–15)
Anion gap: 7 (ref 5–15)
Anion gap: 6 (ref 5–15)
BUN: 10 mg/dL (ref 8–23)
BUN: 10 mg/dL (ref 8–23)
BUN: 12 mg/dL (ref 8–23)
CO2: 17 mmol/L — ABNORMAL LOW (ref 22–32)
CO2: 21 mmol/L — ABNORMAL LOW (ref 22–32)
CO2: 21 mmol/L — ABNORMAL LOW (ref 22–32)
Calcium: 7.6 mg/dL — ABNORMAL LOW (ref 8.9–10.3)
Calcium: 7.4 mg/dL — ABNORMAL LOW (ref 8.9–10.3)
Calcium: 7.4 mg/dL — ABNORMAL LOW (ref 8.9–10.3)
Chloride: 107 mmol/L (ref 98–111)
Chloride: 107 mmol/L (ref 98–111)
Chloride: 107 mmol/L (ref 98–111)
Creatinine, Ser: 0.79 mg/dL (ref 0.44–1.00)
Creatinine, Ser: 0.67 mg/dL (ref 0.44–1.00)
Creatinine, Ser: 0.64 mg/dL (ref 0.44–1.00)
GFR calc Af Amer: >60 mL/min (ref >60)
GFR calc Af Amer: >60 mL/min (ref >60)
GFR calc Af Amer: >60 mL/min (ref >60)
GFR calc non Af Amer: >60 mL/min (ref >60)
GFR calc non Af Amer: >60 mL/min (ref >60)
GFR calc non Af Amer: >60 mL/min (ref >60)
Glucose, Bld: 84 mg/dL (ref 70–99)
Glucose, Bld: 95 mg/dL (ref 70–99)
Glucose, Bld: 125 mg/dL — ABNORMAL HIGH (ref 70–99)
Potassium: 3.2 mmol/L — ABNORMAL LOW (ref 3.5–5.1)
Potassium: 3.9 mmol/L (ref 3.5–5.1)
Potassium: 3 mmol/L — ABNORMAL LOW (ref 3.5–5.1)
Sodium: 134 mmol/L — ABNORMAL LOW (ref 135–145)
Sodium: 135 mmol/L (ref 135–145)
Sodium: 134 mmol/L — ABNORMAL LOW (ref 135–145)

## 2018-06-21 LAB — RESPIRATORY PANEL BY PCR

## 2018-06-21 LAB — COMPREHENSIVE METABOLIC PANEL
ALT: 22 U/L (ref 0–44)
AST: 43 U/L — ABNORMAL HIGH (ref 15–41)
Albumin: 3.1 g/dL — ABNORMAL LOW (ref 3.5–5.0)
Alkaline Phosphatase: 66 U/L (ref 38–126)
Anion gap: 11 (ref 5–15)
BUN: 12 mg/dL (ref 8–23)
CO2: 17 mmol/L — ABNORMAL LOW (ref 22–32)
Calcium: 7.8 mg/dL — ABNORMAL LOW (ref 8.9–10.3)
Chloride: 107 mmol/L (ref 98–111)
Creatinine, Ser: 0.82 mg/dL (ref 0.44–1.00)
GFR calc Af Amer: >60 mL/min (ref >60)
GFR calc non Af Amer: >60 mL/min (ref >60)
Glucose, Bld: 87 mg/dL (ref 70–99)
Potassium: 4 mmol/L (ref 3.5–5.1)
Sodium: 135 mmol/L (ref 135–145)
Total Bilirubin: 1 mg/dL (ref 0.3–1.2)
Total Protein: 5.9 g/dL — ABNORMAL LOW (ref 6.5–8.1)

## 2018-06-21 LAB — URINALYSIS, ROUTINE W REFLEX MICROSCOPIC
Bilirubin Urine: NEGATIVE
Glucose, UA: NEGATIVE mg/dL
Ketones, ur: 80 mg/dL — AB
Leukocytes,Ua: NEGATIVE
Nitrite: NEGATIVE
Protein, ur: NEGATIVE mg/dL
Specific Gravity, Urine: 1.016 (ref 1.005–1.030)
pH: 5 (ref 5.0–8.0)

## 2018-06-21 LAB — BLOOD GAS, ARTERIAL
Acid-base deficit: 3.7 mmol/L — ABNORMAL HIGH (ref 0.0–2.0)
Acid-base deficit: 2.8 mmol/L — ABNORMAL HIGH (ref 0.0–2.0)
Bicarbonate: 18.9 mmol/L — ABNORMAL LOW (ref 20.0–28.0)
Bicarbonate: 19.6 mmol/L — ABNORMAL LOW (ref 20.0–28.0)
Drawn by: 11249
Drawn by: 257881
FIO2: 60
MECHVT: 540 mL
O2 Content: 2 L/min
O2 Saturation: 94.3 %
O2 Saturation: 98.3 %
PEEP: 5 cmH2O
Patient temperature: 99.7
Patient temperature: 98.6
RATE: 18 resp/min
pCO2 arterial: 29.1 mmHg — ABNORMAL LOW (ref 32.0–48.0)
pCO2 arterial: 27 mmHg — ABNORMAL LOW (ref 32.0–48.0)
pH, Arterial: 7.432 (ref 7.350–7.450)
pH, Arterial: 7.474 — ABNORMAL HIGH (ref 7.350–7.450)
pO2, Arterial: 74.1 mmHg — ABNORMAL LOW (ref 83.0–108.0)
pO2, Arterial: 105 mmHg (ref 83.0–108.0)

## 2018-06-21 LAB — C-REACTIVE PROTEIN: CRP: 11.1 mg/dL — ABNORMAL HIGH (ref <1.0)

## 2018-06-21 LAB — GLUCOSE, CAPILLARY
Glucose-Capillary: 81 mg/dL (ref 70–99)
Glucose-Capillary: 83 mg/dL (ref 70–99)
Glucose-Capillary: 76 mg/dL (ref 70–99)
Glucose-Capillary: 91 mg/dL (ref 70–99)
Glucose-Capillary: 109 mg/dL — ABNORMAL HIGH (ref 70–99)
Glucose-Capillary: 68 mg/dL — ABNORMAL LOW (ref 70–99)

## 2018-06-21 LAB — RPR: RPR Ser Ql: NONREACTIVE

## 2018-06-21 LAB — CBC
HCT: 32.1 % — ABNORMAL LOW (ref 36.0–46.0)
Hemoglobin: 10.8 g/dL — ABNORMAL LOW (ref 12.0–15.0)
MCH: 32.7 pg (ref 26.0–34.0)
MCHC: 33.6 g/dL (ref 30.0–36.0)
MCV: 97.3 fL (ref 80.0–100.0)
Platelets: 208 10*3/uL (ref 150–400)
RBC: 3.3 MIL/uL — ABNORMAL LOW (ref 3.87–5.11)
RDW: 13.9 % (ref 11.5–15.5)
WBC: 3.7 10*3/uL — ABNORMAL LOW (ref 4.0–10.5)
nRBC: 0 % (ref 0.0–0.2)

## 2018-06-21 LAB — SODIUM, URINE, RANDOM: Sodium, Ur: 132 mmol/L

## 2018-06-21 LAB — OSMOLALITY, URINE: Osmolality, Ur: 414 mOsm/kg (ref 300–900)

## 2018-06-21 LAB — FERRITIN: Ferritin: 167 ng/mL (ref 11–307)

## 2018-06-21 LAB — STREP PNEUMONIAE URINARY ANTIGEN: Strep Pneumo Urinary Antigen: NEGATIVE

## 2018-06-21 LAB — MAGNESIUM: Magnesium: 2.1 mg/dL (ref 1.7–2.4)

## 2018-06-21 MED ORDER — HALOPERIDOL LACTATE 5 MG/ML IJ SOLN
2.0000 mg | Freq: Once | INTRAMUSCULAR | Status: AC
  Administered 2018-06-21: 03:00:00 2 mg via INTRAVENOUS
  Filled 2018-06-21: qty 1

## 2018-06-21 MED ORDER — POTASSIUM CHLORIDE 20 MEQ PO PACK
40.0000 meq | PACK | Freq: Once | ORAL | Status: AC
  Administered 2018-06-21: 40 meq
  Filled 2018-06-21: qty 2

## 2018-06-21 MED ORDER — DEXTROSE 50 % IV SOLN
12.5000 g | INTRAVENOUS | Status: AC
  Administered 2018-06-21: 12.5 g via INTRAVENOUS

## 2018-06-21 MED ORDER — SODIUM CHLORIDE 0.9% FLUSH: 10.0000 mL | INTRAVENOUS | Status: DC | PRN

## 2018-06-21 MED ORDER — MIDAZOLAM HCL 2 MG/2ML IJ SOLN
1.0000 mg | INTRAMUSCULAR | Status: DC | PRN
  Administered 2018-06-21: 1 mg via INTRAVENOUS
  Filled 2018-06-21 (×2): qty 2

## 2018-06-21 MED ORDER — CHLORHEXIDINE GLUCONATE 0.12% ORAL RINSE (MEDLINE KIT)
15.0000 mL | Freq: Two times a day (BID) | OROMUCOSAL | Status: DC
  Administered 2018-06-21 – 2018-06-25 (×9): 15 mL via OROMUCOSAL

## 2018-06-21 MED ORDER — ROCURONIUM BROMIDE 50 MG/5ML IV SOLN
30.0000 mg | Freq: Once | INTRAVENOUS | Status: AC
  Administered 2018-06-21: 30 mg via INTRAVENOUS

## 2018-06-21 MED ORDER — SODIUM CHLORIDE 0.9% FLUSH
10.0000 mL | Freq: Two times a day (BID) | INTRAVENOUS | Status: DC
  Administered 2018-06-21: 21:00:00 30 mL
  Administered 2018-06-22: 10 mL
  Administered 2018-06-22: 30 mL
  Administered 2018-06-23 – 2018-06-27 (×9): 10 mL

## 2018-06-21 MED ORDER — POTASSIUM CHLORIDE 10 MEQ/100ML IV SOLN
10.0000 meq | INTRAVENOUS | Status: AC
  Administered 2018-06-21 (×6): 10 meq via INTRAVENOUS
  Filled 2018-06-21 (×6): qty 100

## 2018-06-21 MED ORDER — ADULT MULTIVITAMIN LIQUID CH
15.0000 mL | ORAL | Status: DC
  Administered 2018-06-21 – 2018-06-27 (×7): 15 mL
  Filled 2018-06-21 (×7): qty 15

## 2018-06-21 MED ORDER — FENTANYL CITRATE (PF) 100 MCG/2ML IJ SOLN
25.0000 ug | INTRAMUSCULAR | Status: DC | PRN
  Administered 2018-06-21 – 2018-06-22 (×4): 100 ug via INTRAVENOUS
  Administered 2018-06-23: 75 ug via INTRAVENOUS
  Administered 2018-06-23: 02:00:00 25 ug via INTRAVENOUS
  Filled 2018-06-21 (×6): qty 2

## 2018-06-21 MED ORDER — CHLORHEXIDINE GLUCONATE CLOTH 2 % EX PADS
6.0000 | MEDICATED_PAD | Freq: Every day | CUTANEOUS | Status: DC
  Administered 2018-06-21: 21:00:00 6 via TOPICAL

## 2018-06-21 MED ORDER — OSMOLITE 1.5 CAL PO LIQD
1000.0000 mL | ORAL | Status: DC
  Administered 2018-06-21 – 2018-06-27 (×8): 1000 mL
  Filled 2018-06-21 (×8): qty 1000

## 2018-06-21 MED ORDER — MIDAZOLAM HCL 2 MG/2ML IJ SOLN
2.0000 mg | Freq: Once | INTRAMUSCULAR | Status: AC
  Administered 2018-06-21: 2 mg via INTRAVENOUS

## 2018-06-21 MED ORDER — ENOXAPARIN SODIUM 40 MG/0.4ML ~~LOC~~ SOLN
40.0000 mg | SUBCUTANEOUS | Status: DC
  Administered 2018-06-21 – 2018-06-27 (×7): 40 mg via SUBCUTANEOUS
  Filled 2018-06-21 (×7): qty 0.4

## 2018-06-21 MED ORDER — PRO-STAT SUGAR FREE PO LIQD
60.0000 mL | Freq: Three times a day (TID) | ORAL | Status: DC
  Administered 2018-06-21 – 2018-06-27 (×20): 60 mL
  Filled 2018-06-21 (×21): qty 60

## 2018-06-21 MED ORDER — HYDRALAZINE HCL 20 MG/ML IJ SOLN
5.0000 mg | Freq: Four times a day (QID) | INTRAMUSCULAR | Status: DC | PRN
  Administered 2018-06-21: 09:00:00 5 mg via INTRAVENOUS
  Administered 2018-06-24 – 2018-06-27 (×7): 10 mg via INTRAVENOUS
  Filled 2018-06-21 (×8): qty 1

## 2018-06-21 MED ORDER — PANTOPRAZOLE SODIUM 40 MG IV SOLR
40.0000 mg | Freq: Every day | INTRAVENOUS | Status: DC
  Administered 2018-06-21: 10:00:00 40 mg via INTRAVENOUS
  Filled 2018-06-21: qty 40

## 2018-06-21 MED ORDER — VITAL HIGH PROTEIN PO LIQD: 1000.0000 mL | ORAL | Status: DC

## 2018-06-21 MED ORDER — HALOPERIDOL LACTATE 5 MG/ML IJ SOLN
2.0000 mg | INTRAMUSCULAR | Status: DC | PRN
  Administered 2018-06-21: 2 mg via INTRAVENOUS
  Filled 2018-06-21: qty 1

## 2018-06-21 MED ORDER — ORAL CARE MOUTH RINSE
15.0000 mL | OROMUCOSAL | Status: DC
  Administered 2018-06-21 – 2018-06-25 (×34): 15 mL via OROMUCOSAL

## 2018-06-21 MED ORDER — POTASSIUM CHLORIDE 10 MEQ/50ML IV SOLN
10.0000 meq | INTRAVENOUS | Status: AC
  Administered 2018-06-22 (×2): 10 meq via INTRAVENOUS
  Filled 2018-06-21 (×2): qty 50

## 2018-06-21 MED ORDER — ALBUTEROL SULFATE (2.5 MG/3ML) 0.083% IN NEBU
2.5000 mg | INHALATION_SOLUTION | RESPIRATORY_TRACT | Status: DC | PRN
  Administered 2018-06-25: 2.5 mg via RESPIRATORY_TRACT
  Filled 2018-06-21: qty 3

## 2018-06-21 MED ORDER — PANTOPRAZOLE SODIUM 40 MG PO PACK
40.0000 mg | PACK | Freq: Every day | ORAL | Status: DC
  Administered 2018-06-22 – 2018-06-27 (×6): 40 mg
  Filled 2018-06-21 (×6): qty 20

## 2018-06-21 MED ORDER — CHLORHEXIDINE GLUCONATE CLOTH 2 % EX PADS
6.0000 | MEDICATED_PAD | Freq: Every day | CUTANEOUS | Status: DC
  Administered 2018-06-23 – 2018-06-27 (×6): 6 via TOPICAL

## 2018-06-21 MED ORDER — MIDAZOLAM HCL 2 MG/2ML IJ SOLN
INTRAMUSCULAR | Status: AC
  Filled 2018-06-21: qty 2

## 2018-06-21 MED ORDER — DOCUSATE SODIUM 50 MG/5ML PO LIQD: 100.0000 mg | Freq: Two times a day (BID) | ORAL | Status: DC | PRN

## 2018-06-21 MED ORDER — ETOMIDATE 2 MG/ML IV SOLN
20.0000 mg | Freq: Once | INTRAVENOUS | Status: AC
  Administered 2018-06-21: 20 mg via INTRAVENOUS

## 2018-06-21 MED ORDER — MIDAZOLAM HCL 2 MG/2ML IJ SOLN
1.0000 mg | INTRAMUSCULAR | Status: DC | PRN
  Administered 2018-06-21: 11:00:00 1 mg via INTRAVENOUS

## 2018-06-21 MED ORDER — FENTANYL CITRATE (PF) 100 MCG/2ML IJ SOLN
50.0000 ug | Freq: Once | INTRAMUSCULAR | Status: AC
  Administered 2018-06-21: 10:00:00 50 ug via INTRAVENOUS

## 2018-06-21 MED ORDER — INSULIN ASPART 100 UNIT/ML ~~LOC~~ SOLN
0.0000 [IU] | Freq: Four times a day (QID) | SUBCUTANEOUS | Status: DC
  Administered 2018-06-23 – 2018-06-25 (×7): 1 [IU] via SUBCUTANEOUS
  Administered 2018-06-25: 18:00:00 2 [IU] via SUBCUTANEOUS
  Administered 2018-06-26 (×2): 1 [IU] via SUBCUTANEOUS
  Administered 2018-06-26: 02:00:00 2 [IU] via SUBCUTANEOUS
  Administered 2018-06-27: 18:00:00 1 [IU] via SUBCUTANEOUS
  Administered 2018-06-27: 05:00:00 3 [IU] via SUBCUTANEOUS
  Administered 2018-06-27: 2 [IU] via SUBCUTANEOUS
  Administered 2018-06-27: 1 [IU] via SUBCUTANEOUS

## 2018-06-21 MED ORDER — FENTANYL CITRATE (PF) 100 MCG/2ML IJ SOLN
INTRAMUSCULAR | Status: AC
  Filled 2018-06-21: qty 2

## 2018-06-21 MED ORDER — FENTANYL CITRATE (PF) 100 MCG/2ML IJ SOLN: 25.0000 ug | INTRAMUSCULAR | Status: DC | PRN

## 2018-06-21 NOTE — Progress Notes (Signed)
Patient O2 sats went down to 52% on 2L. Patient recovered when O2 changed to 5L. CCM called to bedside. Nettey MD paged. Non-rebreather at bedside.

## 2018-06-21 NOTE — Progress Notes (Addendum)
[facility] PROGRESS NOTE  Nancy Parks AVW:[id number] DOB: 11-Jul-1950 DOA: 07/06/2018 PCP: Nancy Parks  Assessment/Plan:   Febrile illness with worsening headache and acute onset confusion concern for encephalitis or meningitis. Lymphocytic predominance on CSF with WBC of 44 concerning for viral etiology of.  Currently on ampicillin and acyclovir for coverage for potential HSV/Listeria meningitic process.  Empiric vancomycin and ceftriaxone were discontinued on 5/3.  Patient continues to spike fevers.  Blood cultures remain unremarkable.  May need broadening out of antibiotics given persistent fever.  X-ray on 5/30 showed questionable bilateral opacities that could be atypical pneumonia versus atelectasis.  CT abdomen shows no acute abdominal pathology other than hydronephrosis.  Acute hypoxic respiratory failure worsening. Patient transferred to ICU on 5/30 due to poor respiratory effort despite being on only 2 L with normal oxygen saturation and no abnormalities on ABG x2.  However oxygen requirements increased on 5/4 prompting intubation by critical care team.  Patient's healthcare power of attorney Nancy Parks and her sister are both agreeable to intubation for respiratory distress ).:  Rapid test negative x2.  Send out lab is currently pending though low suspicion for COVID given alternative diagnosis based on LP (d-dimer elevated 0.96-2.1, otherwise inflammatory markers unremarkable).  Strep pneumo and RVP negative, Legionella pending   Worsening migraines, discoordination, memory deficits, and visual hallucinations ongoing for months.    Initially was attributed to fluoxetine as an outpatient and patient did not follow taper regimen.  CT scan here shows no acute abnormalities and  MRI brain done on 4/27 no pathologic lesion only moderate subcortical and periventricular T2 flair hyperintensities presumed to be chronic microvascular ischemic change.   Follow-up LP fluid  analysis, and therapy as above.   Electrolyte imbalances including hypovolemic hyponatremia and hypokalemia, slowly improving.   Continue IVF ( normal saline) to improve sodium,  Check mag, closely monitor with q 8 hr BMP,Continue to replete potassium, .  Mild to moderate bilateral hydronephrosis The bladder is also moderately distended.  UA obtained, Foley catheter placed.   Palpable left neck mass.   Follow-up with neck ultrasound was unremarkable  Suppressed TSH.  Likely in setting of acute illness.  T3 depressed, T4 within normal limits.  Depression.  Continue home [place]  Smoldering IgG lambda Multiple Myeloma, stable Followed by Dr. Marin Parks as outpatient, currently following  Incidental 8 mm Pulmonary nodule Found on CTA. Will need outpatient CT scan 6 to 12 months   Code Status: DNR  Family Communication:  discussed with Nancy Parks at [phone] they would like for her to be intubated given her worsening respiratory status. if she has cardiac arrest they do not want resuscitation ( including no CPR or breathing machine at that time) she will remain DNR   Disposition Plan:  acyclovir and ampicillin for meningitic presentation while awaiting CSF culture, now intubated due to respiratory status/mentation/decreased ability to protect airway  Consultants:  Neurology  PCCM  Procedures:  none  Antibiotics:  none   HPI/Subjective:  Nancy Parks is a 68 y.o. year old female with medical history significant for IgG lambda smoldering myeloma, depression with anxiety, and migraines who presented on 07/07/2018 with 2 days of fevers, nausea, vomiting previously treated with Augmentin for presumed sinusitis/otitis media on 4/29 and was found to have  fever, significant confusion with word finding difficulty, significant hyponatremia and hypokalemia initially presumed to be related to viral gastroenteritis on admission. In the ED her COVID rapid test was negative. Given  elevated d-dimer CTA  was obtained, negative for PE.  Given persistent fever and confusion treated empirically for presumed meningitis with vancomycin, ceftriaxone, ampicillin, and acyclovir. LP obtained on 5/3 showed lymphocytic predominant CSF so all antibacterial therapy was discontinued except ampicillin and acyclovir therapy.   Patient has continued to spike fevers with Tmax of 103 today. She remains confused with inability to converse at all and only grunting, moving extremities and occasionally opening eyes to voice.   Most of her care has been communicated to her HCPOA and friend Nancy Parks( [phone] )  Patient is unable to give any meaningful history given significant confusion/disorientation   Received haldol overnight for agitation Intubated this am due to worsening respiratory status and inability to protect airway due to worsening neurologic status  Objective:     Vitals:   06/19/18 0249 06/19/18 0632  BP: (!) 141/85 (!) 143/82  Pulse: 94 99  Resp: (!) 22 20  Temp: 97.7 F (36.5 C) 99.7 F (37.6 C)  SpO2: 100% 98%    Intake/Output Summary (Last 24 hours) at 06/19/2018 0752 Last data filed at 06/19/2018 0529    Gross per 24 hour  Intake 1223.09 ml  Output -  Net 1223.09 ml      Filed Weights   06/19/18 0500  Weight: 88.9 kg    Exam:   General:   Elderly female, lying in bed , not responsive  Cardiovascular:  Regular rate and rhythm, no peripheral edema  Respiratory:  Loud gurgling mouth breathing on 5L,   Abdomen:  Soft, nondistended, diminished bowel sounds  Musculoskeletal: Diminised range of motion, no rigidity  Skin  no rashes or lesions Neurologic not responsive. Barely withdraws to pain stimuli,   not oriented, not alert Data Reviewed: Basic Metabolic Panel: LastLabs       Recent Labs  Lab 06/29/2018 2048 07/08/2018 2317 06/19/18 0412  NA 125*  --  130*  K 2.6*  --  3.4*  CL 91*  --  98  CO2 24  --  24  GLUCOSE 113*  --   102*  BUN 19  --  15  CREATININE 0.79  --  0.62  CALCIUM 8.2*  --  7.7*  MG  --  2.0  --      Liver Function Tests: LastLabs     Recent Labs  Lab 07/06/2018 2048  AST 25  ALT 16  ALKPHOS 78  BILITOT 0.9  PROT 6.8  ALBUMIN 3.7     LastLabs  No results for input(s): LIPASE, AMYLASE in the last 168 hours.   LastLabs  No results for input(s): AMMONIA in the last 168 hours.   CBC: LastLabs      Recent Labs  Lab 06/29/2018 2048 06/19/18 0412  WBC 5.1 5.3  NEUTROABS 3.3  --   HGB 11.0* 11.3*  HCT 31.9* 34.0*  MCV 95.8 96.3  PLT 232 233     Cardiac Enzymes: LastLabs  No results for input(s): CKTOTAL, CKMB, CKMBINDEX, TROPONINI in the last 168 hours.   BNP (last 3 results) RecentLabs(withinlast365days)  No results for input(s): BNP in the last 8760 hours.    ProBNP (last 3 results) RecentLabs(withinlast365days)  No results for input(s): PROBNP in the last 8760 hours.    CBG: LastLabs  No results for input(s): GLUCAP in the last 168 hours.           Recent Results (from the past 240 hour(s))  SARS Coronavirus 2 (Hospital order, Performed in [place] hospital lab)       Status: None   Collection Time:   8:48 PM  Result Value Ref Range Status   SARS Coronavirus 2 NEGATIVE NEGATIVE Final    Comment: (NOTE) If result is NEGATIVE SARS-CoV-2 target nucleic acids are NOT DETECTED. The SARS-CoV-2 RNA is generally detectable in upper and lower  respiratory specimens during the acute phase of infection. The lowest  concentration of SARS-CoV-2 viral copies this assay can detect is 250  copies / mL. A negative result does not preclude SARS-CoV-2 infection  and should not be used as the sole basis for treatment or other  patient management decisions.  A negative result may occur with  improper specimen collection / handling, submission of specimen other  than nasopharyngeal swab, presence of viral mutation(s) within the   areas targeted by this assay, and inadequate number of viral copies  (<250 copies / mL). A negative result must be combined with clinical  observations, patient history, and epidemiological information. If result is POSITIVE SARS-CoV-2 target nucleic acids are DETECTED. The SARS-CoV-2 RNA is generally detectable in upper and lower  respiratory specimens dur ing the acute phase of infection.  Positive  results are indicative of active infection with SARS-CoV-2.  Clinical  correlation with patient history and other diagnostic information is  necessary to determine patient infection status.  Positive results do  not rule out bacterial infection or co-infection with other viruses. If result is PRESUMPTIVE POSTIVE SARS-CoV-2 nucleic acids MAY BE PRESENT.   A presumptive positive result was obtained on the submitted specimen  and confirmed on repeat testing.  While 2019 novel coronavirus  (SARS-CoV-2) nucleic acids may be present in the submitted sample  additional confirmatory testing may be necessary for epidemiological  and / or clinical management purposes  to differentiate between  SARS-CoV-2 and other Sarbecovirus currently known to infect humans.  If clinically indicated additional testing with an alternate test  methodology ([id number]) is advised. The SARS-CoV-2 RNA is generally  detectable in upper and lower respiratory sp ecimens during the acute  phase of infection. The expected result is Negative. Fact Sheet for Patients:  https://www.fda.gov/media/136312/download Fact Sheet for Healthcare Providers: https://www.fda.gov/media/136313/download This test is not yet approved or cleared by the [place] FDA and has been authorized for detection and/or diagnosis of SARS-CoV-2 by FDA under an Emergency Use Authorization (EUA).  This EUA will remain in effect (meaning this test can be used) for the duration of the COVID-19 declaration under Section 564(b)(1) of the Act, 21  U.S.C. section 360bbb-3(b)(1), unless the authorization is terminated or revoked sooner. Performed at [facility], [address] [place], [place] [address]      Studies:  ImagingResults(Last48hours)  Ct Head Wo Contrast  Result Date: 07/11/2018 CLINICAL DATA:  Altered level of consciousness, headache, fever, sinus infection, confusion, disorientation, history multiple myeloma EXAM: CT HEAD WITHOUT CONTRAST TECHNIQUE: Contiguous axial images were obtained from the base of the skull through the vertex without intravenous contrast. Sagittal and coronal MPR images reconstructed from axial data set. COMPARISON:  03/20/2015 Correlation: MR brain 06/14/2018 FINDINGS: Brain: Generalized atrophy. Normal ventricular morphology. No midline shift or mass effect. Small vessel chronic ischemic changes of deep cerebral white matter. No intracranial hemorrhage, mass lesion, evidence of acute infarction, or extra-axial fluid collection. Vascular: No definite hyperdense vessels Skull: Intact Sinuses/Orbits: Clear Other: N/A IMPRESSION: Atrophy with small vessel chronic ischemic changes of deep cerebral white matter. No acute intracranial abnormalities. Electronically Signed   By: Mark  Boles M.D.   On: 07/13/2018 23:10     Ct Angio Chest Pe W And/or Wo Contrast  Result Date: 06/19/2018 CLINICAL DATA:  Chest pain, shortness of breath, positive D-dimer EXAM: CT ANGIOGRAPHY CHEST WITH CONTRAST TECHNIQUE: Multidetector CT imaging of the chest was performed using the standard protocol during bolus administration of intravenous contrast. Multiplanar CT image reconstructions and MIPs were obtained to evaluate the vascular anatomy. CONTRAST:  100mL OMNIPAQUE IOHEXOL 350 MG/ML SOLN COMPARISON:  07/04/2018 FINDINGS: Cardiovascular: Heart is normal size. Aorta is normal caliber. No filling defects in the pulmonary arteries to suggest pulmonary emboli. Mediastinum/Nodes: No mediastinal, hilar, or  axillary adenopathy. Lungs/Pleura: Bibasilar atelectasis. Nodular density in the superior segment of the left lower lobe lung the aortic arch posteriorly and medially measures 8 mm. No effusions. Upper Abdomen: Imaging into the upper abdomen shows no acute findings. Musculoskeletal: Chest wall soft tissues are unremarkable. No acute bony abnormality. Review of the MIP images confirms the above findings. IMPRESSION: No evidence of pulmonary embolus. Bibasilar atelectasis. 8 mm nodule in the medial superior segment of the left lower lobe adjacent to the aortic arch. Non-contrast chest CT at 6-12 months is recommended. If the nodule is stable at time of repeat CT, then future CT at 18-24 months (from today's scan) is considered optional for low-risk patients, but is recommended for high-risk patients. This recommendation follows the consensus statement: Guidelines for Management of Incidental Pulmonary Nodules Detected on CT Images: From the Fleischner Society 2017; Radiology 2017; 284:228-243. Electronically Signed   By: Kevin  Dover M.D.   On: 06/19/2018 00:25   Dg Chest Port 1 View  Result Date: 07/03/2018 CLINICAL DATA:  Fever, sinus infection, headaches EXAM: PORTABLE CHEST 1 VIEW COMPARISON:  07/13/2012 FINDINGS: There is no focal parenchymal opacity. There is no pleural effusion or pneumothorax. The heart and mediastinal contours are unremarkable. The osseous structures are unremarkable. IMPRESSION: No active disease. Electronically Signed   By: Hetal  Patel   On: 07/10/2018 20:59     Scheduled Meds: . enoxaparin (LOVENOX) injection  40 mg Subcutaneous Daily  . [START ON 06/25/2018] FLUoxetine  20 mg Oral Daily   Followed by  . [START ON 07/02/2018] FLUoxetine  10 mg Oral Daily  . FLUoxetine  30 mg Oral Daily  . [START ON 06/20/2018] nortriptyline  75 mg Oral QHS  . sodium chloride flush  3 mL Intravenous Q12H   Continuous Infusions:  Principal Problem:   Febrile illness Active Problems:    Migraine   Smoldering multiple myeloma (HCC)   Altered mental status   Depression with anxiety   Lung nodule seen on imaging study   Hyponatremia   Hypokalemia       D            [facility]          

## 2018-06-21 NOTE — Procedures (Signed)
Endotracheal Intubation Procedure Note  Indication for endotracheal intubation: respiratory failure. Airway Assessment: Mallampati Class: II (hard and soft palate, upper portion of tonsils anduvula visible). Sedation: etomidate, fentanyl and midazolam.etomidate 20, fentanyl 50, midazolam 2 Paralytic: rocuronium.30mg  Lidocaine: no. Atropine: no. Equipment: glidescope. Cricoid Pressure: no. Number of attempts: 1. ETT location confirmed by ETCO2 monitor.  Karna Abed A Jovanne Riggenbach 06/21/2018

## 2018-06-21 NOTE — Consult Note (Signed)
Referral MD  Reason for Referral: Possible viral encephalitis; history of low-grade IgG lambda myeloma/smoldering myeloma; history of chronic leukopenia  No chief complaint on file. : Patient cannot give any history.  HPI: Nancy Parks is well-known to me.  She is a nice 68 year old white female.  I am absolutely stunned as to what is happened to her.  She has a history of low-grade myeloma.  She had treatment with Velcade/Revlimid/Decadron.  It probably has been close to 4 years that she had any kind of treatment.  We last saw her back in January 2020.  At that time, her myeloma levels are holding stable and low.  She has is a chronic leukopenia.  She has had multiple bone marrow biopsies.  Looks like she is been having this progressive neurological issue.  Ultimately, she now is admitted to the ICU.  She is not responsive.  She had a spinal tap done.  The spinal tap seems to be consistent with a viral type of encephalitis.  We have to await the flow cytometry and cytogenetics.  What is unusual is that her white cell count is back to normal.  She I think had a temperature.  Her cultures so far have been negative.  The CSF cultures have been negative.  She is on prophylactic antibiotics with acyclovir/ampicillin.  She has decent renal function.  Her calcium has not been high.  Her total protein is a little on the lower side.  I do not see anything that would suggest that her myeloma has progressed at this point.  I know she is getting wonderful care from everybody in the ICU.   Past Medical History:  Diagnosis Date  . Depression   . Headache(784.0)    Migraines  . Leukopenia   . Leukopenia 07/13/2012  . Multiple myeloma, without mention of having achieved remission 07/13/2012  . Smoldering multiple myeloma ([place]) 03/08/2018  :  Past Surgical History:  Procedure Laterality Date  . BASAL CELL CARCINOMA EXCISION  2013   removed from nose  . CATARACT EXTRACTION  09/2007  .  LAPAROSCOPIC ENDOMETRIOSIS FULGURATION  1979  :   Current Facility-Administered Medications:  .  0.9 %  sodium chloride infusion, , Intravenous, Continuous, Nettey, Shayla D, MD, Last Rate: 100 mL/hr at 06/21/18 0800 .  acetaminophen (TYLENOL) tablet 650 mg, 650 mg, Oral, Q6H PRN **OR** acetaminophen (TYLENOL) suppository 650 mg, 650 mg, Rectal, Q6H PRN, Oretha Milch D, MD, 650 mg at 06/21/18 0533 .  acyclovir (ZOVIRAX) 890 mg in dextrose 5 % 150 mL IVPB, 10 mg/kg, Intravenous, Q8H, Nettey, Shayla D, MD, Last Rate: 168 mL/hr at 06/21/18 0800 .  ampicillin (OMNIPEN) 2 g in sodium chloride 0.9 % 100 mL IVPB, 2 g, Intravenous, Q4H, Desiree Hane, MD, Stopped at 06/21/18 0754 .  dextrose 50 % solution, , , ,  .  haloperidol lactate (HALDOL) injection 2 mg, 2 mg, Intravenous, Q4H PRN, Shellia Cleverly, MD, 2 mg at 06/21/18 0750 .  insulin aspart (novoLOG) injection 0-9 Units, 0-9 Units, Subcutaneous, TID WC, Nettey, Shayla D, MD .  ondansetron (ZOFRAN) tablet 4 mg, 4 mg, Oral, Q6H PRN **OR** ondansetron (ZOFRAN) injection 4 mg, 4 mg, Intravenous, Q6H PRN, Nettey, Shayla D, MD .  sodium chloride (PF) 0.9 % injection, , , ,  .  sodium chloride flush (NS) 0.9 % injection 3 mL, 3 mL, Intravenous, Q12H, Oretha Milch D, MD, 3 mL at 06/20/18 2141 .  thiamine (B-1) injection 100 mg, 100 mg, Intravenous,  TID, Oretha Milch D, MD, 100 mg at 06/20/18 2138:  . dextrose      . insulin aspart  0-9 Units Subcutaneous TID WC  . sodium chloride (PF)      . sodium chloride flush  3 mL Intravenous Q12H  . thiamine injection  100 mg Intravenous TID  :  Allergies  Allergen Reactions  . Latex Rash    REACTION: Rash  REACTION: Rash  . Other Rash  . Tape Rash  . Fosamax [Alendronate Sodium] Other (See Comments)    GI upset  :  Family History  Problem Relation Age of Onset  . Breast cancer Other        grandmother   . Colon cancer Father        colon adenoma  . Heart attack Father   .  Hyperlipidemia Sister   . Hypertension Sister   . Cancer Sister        throat, smoker  . Melanoma Sister   . Diabetes Sister   . CVA Mother   . Diabetes Brother   :  Social History   Socioeconomic History  . Marital status: Divorced    Spouse name: Not on file  . Number of children: Not on file  . Years of education: Not on file  . Highest education level: Bachelor's degree (e.g., BA, AB, BS)  Occupational History  . Occupation: Aeronautical engineer: [employer]  . Occupation: Economist  Social Needs  . Financial resource strain: Not on file  . Food insecurity:    Worry: Not on file    Inability: Not on file  . Transportation needs:    Medical: Not on file    Non-medical: Not on file  Tobacco Use  . Smoking status: Never Smoker  . Smokeless tobacco: Never Used  . Tobacco comment: never used tobacco  Substance and Sexual Activity  . Alcohol use: No    Alcohol/week: 0.0 standard drinks  . Drug use: No  . Sexual activity: Yes    Partners: Male  Lifestyle  . Physical activity:    Days per week: Not on file    Minutes per session: Not on file  . Stress: Not on file  Relationships  . Social connections:    Talks on phone: Not on file    Gets together: Not on file    Attends religious service: Not on file    Active member of club or organization: Not on file    Attends meetings of clubs or organizations: Not on file    Relationship status: Not on file  . Intimate partner violence:    Fear of current or ex partner: Not on file    Emotionally abused: Not on file    Physically abused: Not on file    Forced sexual activity: Not on file  Other Topics Concern  . Not on file  Social History Narrative   No exercise.  Doing 10 min of yoga.       Patient is right-handed. She lives alone in a 2 story home. She drinks 3 cups of coffee a day. Seh works out at [employer] 4 x week.  :  Pertinent items are noted in HPI.  Exam: As above Patient Vitals for the past 24  hrs:  BP Temp Temp src Pulse Resp SpO2 Weight  06/21/18 0800 (!) 182/84 - - (!) 105 (!) 24 95 % -  06/21/18 0737 (!) 176/69 - - (!) 106 (!) 27 95 % -  06/21/18 0709 - (!) 100.6 F (38.1 C) Bladder (!) 108 (!) 25 95 % -  06/21/18 0700 (!) 183/73 (!) 100.6 F (38.1 C) - (!) 116 (!) 30 94 % -  06/21/18 0600 (!) 170/101 (!) 102.6 F (39.2 C) Bladder (!) 112 (!) 32 93 % -  06/21/18 0500 (!) 166/95 (!) 103.4 F (39.7 C) Axillary (!) 115 (!) 22 95 % 200 lb 6.4 oz (90.9 kg)  06/21/18 0300 (!) 180/89 (!) 100.8 F (38.2 C) Temporal 100 (!) 22 96 % -  06/21/18 0200 (!) 170/80 100.1 F (37.8 C) - (!) 118 19 100 % -  06/21/18 0100 (!) 182/77 - - (!) 105 (!) 28 96 % -  06/21/18 0000 (!) 194/86 99.7 F (37.6 C) - (!) 102 (!) 29 96 % -  06/20/18 2200 (!) 168/80 - - (!) 103 (!) 25 100 % -  06/20/18 2100 (!) 140/54 100.1 F (37.8 C) Oral (!) 104 (!) 21 98 % -  06/20/18 2047 (!) 156/85 - - (!) 106 20 99 % -  06/20/18 2045 (!) 143/100 - - (!) 104 (!) 21 98 % -  06/20/18 1735 (!) 156/91 (!) 101.5 F (38.6 C) Oral (!) 112 (!) 24 96 % -  06/20/18 1525 (!) 148/73 (!) 103.2 F (39.6 C) Oral (!) 103 (!) 24 96 % -  06/20/18 1002 131/77 99 F (37.2 C) Oral 99 (!) 21 96 % -     Recent Labs    06/20/18 0510 06/21/18 0647  WBC 4.1 3.7*  HGB 10.5* 10.8*  HCT 32.1* 32.1*  PLT 202 208   Recent Labs    06/21/18 0305 06/21/18 0647  NA 135 134*  K 4.0 3.2*  CL 107 107  CO2 17* 17*  GLUCOSE 87 84  BUN 12 10  CREATININE 0.82 0.79  CALCIUM 7.8* 7.6*    Blood smear review: None  Pathology: Pending    Assessment and Plan: Nancy Parks is a very nice 68 year old white female.  She has a history of a low-grade myeloma.  She has a history of this chronic leukopenia.  She now has this neurological issue that for right now she is not that responsive.  I think the real critical test is going to be the flow cytometry and cytology on the spinal fluid.  It is highly highly unusual to see myeloma invade  the CNS.  I know she had an MRI of the brain done which was unremarkable.  I just wish there was some that we can do to try to help her.  Again, she is getting wonderful care by everybody in the ICU.  Hopefully this is something that can be treated and can be reversed.  We will follow along and help any way that we can.  I have known Nancy Parks for many years.  She is always done quite well.  She is always been very active.  She has been very functional.  It is is very tough to see her this way.    Lattie Haw, MD  [address] [place] 5:10

## 2018-06-21 NOTE — Progress Notes (Signed)
OT Cancellation Note  Patient Details Name: Nancy Parks MRN: [id number] DOB: [birth year]   Cancelled Treatment:    Reason Eval/Treat Not Completed: Medical issues which prohibited therapy  [place], Nancy Parks, OT Acute Rehabilitation Services [phone] [phone]    06/21/2018, 10:10 AM

## 2018-06-21 NOTE — Procedures (Signed)
Central Venous Catheter Insertion Procedure Note Nancy Parks [id number] Sep 02, 1950  Procedure: Insertion of Central Venous Catheter Indications: Assessment of intravascular volume and Drug and/or fluid administration  Procedure Details Consent: Unable to obtain consent because of altered level of consciousness. Time Out: Verified patient identification, verified procedure, site/side was marked, verified correct patient position, special equipment/implants available, medications/allergies/relevent history reviewed, required imaging and test results available.  Performed  Maximum sterile technique was used including antiseptics, cap, gloves, gown, hand hygiene, mask and sheet. Skin prep: Chlorhexidine; local anesthetic administered A antimicrobial bonded/coated triple lumen catheter was placed in the right internal jugular vein using the Seldinger technique.  Evaluation Blood flow good Complications: No apparent complications Patient did tolerate procedure well. Chest X-ray ordered to verify placement.  CXR: normal.  Adewale A Olalere 06/21/2018, 10:08 AM

## 2018-06-21 NOTE — Progress Notes (Signed)
Patient intubated at 0930am on 06/21/2018.  20 Etomidate given 30 Rocuronium given 50 fentanyl given 2 versed given All medications given for intubation charted in [facility].   CVC line put in by [facility] MD after intubation.  After Chest xray, received a call from radiologist suggesting to pull back  CVC 4 cm. Olalere MD notified of this, and MD went to bedside assisted by this RN to pull back CVC 4cm and re-stitched. Patient vitals remained stable throughout procedure.

## 2018-06-21 NOTE — Progress Notes (Signed)
[place] Progress Note Patient Name: Nancy Parks DOB: Jun 02, 1950 MRN: [id number]   Date of Service  06/21/2018  HPI/Events of Note  New consult for Acute delirium and Acute respiratory failure.   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  Ordered only one-time dose of Haldol because of QTC in 490.  ABG in acceptable range.  Patient is DNR but okay to intubate for respiratory failure. will evaluate for intubation if patient does not improve.      Intervention Category Major Interventions: Respiratory failure - evaluation and management;Delirium, psychosis, severe agitation - evaluation and management Intermediate Interventions: Communication with other healthcare providers and/or family;Medication change / dose adjustment Evaluation Type: New Patient Evaluation  Mady Gemma 06/21/2018, 2:33 AM

## 2018-06-21 NOTE — Progress Notes (Signed)
NAME:  Nancy Parks, MRN:  [id number], DOB:  23-Jan-1951, LOS: 1 ADMISSION DATE:  07/08/2018, CONSULTATION DATE: 06/21/2018 REFERRING MD: Lonny Prude, CHIEF COMPLAINT: Altered mental status  Brief History   Patient is a 68 year old female with a history of multiple myeloma admitted with progressive worsening in neurologic status  History of present illness   Patient is 68 year old white female with a history of multiple myeloma who a week or 2 ago began complaining of headache along with intermittent hallucinations.  Since then her neurologic status has progressively worsened.  Neurology is seen the patient is operating with a diagnostic impression of viral encephalitis.  Patient's been started on acyclovir.  This evening I was asked to see due to worsening agitation with accompanying perceived worsening in her respiratory status.  This all seemed to improve with low-dose 2 mg Haldol IV.  On my evaluation the patient is having an almost obstructed airway type breathing although nursing has not noticed any obstructive apneas or failure to protect her airway.    Her respiratory rate previously had jumped up to about 30 but is now down to 18-20.  Arterial blood gas showed relatively normal pH and good oxygen saturation.  Chest x-ray shows slight haze in the bases of both lungs consistent with atypical pneumonia or mild pulmonary edema.  She also was noted to have a sodium of 122 although she does continue to put out significant volumes of urine.  Potassium was down to 2.5.  Anion gap was 9.  PCO2 was 29.1.  There is been some hesitancy to administer regular Haldol due to slightly prolonged QT interval.  This is based off EKG done 06/23/2018.  Patient has spiked a temperature to 103 twice today.  MRI of the brain was normal.  Past Medical History  Multiple myeloma, chronic leukopenia, chronic depression recent changes in neurologic status.  Significant Hospital Events   Admitted to the ICU 06/20/2018  Consults:   Neurology critical care  Procedures:  5/3 LP  5/4 ETT >>  5/4 CVL >>   Significant Diagnostic Tests:  MRI Head 4/27 PTA >> no acute findings , no change from 2019  CT head 5/1 > no acute findings  CT Chest 5/2 > Neg PE, LLL 8 mm nodule 5/2 US neck >no acute process  5/3 CT ABD /Pelvis > mild to mod bilateral hydronephrosis , mod distended bladder , sm effusion, BB atx , tr free fluid in pelvis    Micro Data:  5/3 >BC x 2 >> 5/1 BC x 2 >>  5/3 LP CSF >> cell count 44, predom. Lymphocytes , elevated protein 126 5/1 COVID >> NEGATIVE  5/3 COVID >> NEGATIVE  5/4 Strep Pneu Ag >> NEG  5/4 Legionella >>  5/3 Viral panel >> NEG  5/3 COVID (send out ) >>  5/3 HSV PCR >>  5/3 RPR >>    Antimicrobials:  5/2 Acyclovir empirically for viral encephalitis>> 5/3  Ampicillin for possible Listeria encephalitis>>  Interim history/subjective:  Fever T Max 103.4 , remains unresponsive .  Increased wob this am with drop in O2 sats intermittently and guppy breathing  CK tr up  B/p tr up  LP done yesterday     Objective   Blood pressure (!) 188/102, pulse (!) 110, temperature 99.5 F (37.5 C), resp. rate (!) 22, weight 90.9 kg, SpO2 96 %.        Intake/Output Summary (Last 24 hours) at 06/21/2018 0857 Last data filed at 06/21/2018 0852 Gross per 24  hour  Intake 2758.22 ml  Output 4850 ml  Net -2091.78 ml   Filed Weights   06/19/18 0500 06/20/18 0520 06/21/18 0500  Weight: 88.9 kg 92.2 kg 90.9 kg    Examination: General: Ill appearing female with respiratory distress  HENT: AT/[place] , dry oral mucosa  Lungs: bilateral rhonchi , increased wob ,  Cardiovascular: Regular rate and rhythm  Abdomen: obese, soft , hypoactive BS  Extremities: warm , tr edema  Neuro: Unresponsive , Not responsive to voice.    mild tremulous activity GU: Foley   Resolved Hospital Problem list   NA  Assessment & Plan:  1.  Progressive neurologic deterioration presumably secondary to viral  encephalitis. She is immunosuppressed . Differential includes Listeria encephaliits , toxic metabolic encephalopathy , Serotonin syndrome , neurodegenerative disease  On Pamelor /Prozac prior to admission  COVID in house neg x 2 , Covid (send out ) Pending   Plan  Appreciate Neuro input  Patient on acyclovir and ampicillin  Follow LP results/ culture data  Tr CK level    2.  Acute Hypoxic Respiratory Failure with increased Respiratory Distress 5/4  , unable to protect airway and increased wob 5/4 Pt is a Limited Code with intubation only . Sister and Friend are H-POA . Dr. Lonny Prude spoke with them this am and updated on condition.   Plan  Intubate/ Vent support  Check CXR  VAP  Check ABG in 1 hr   3.  Hyponatremia:-Mild  Hypokalemia   Plan   Urine sodium and osmolarity. K+ replaced 5/4   4.  Febrile illness - CXR 5/3  consistent with mild pulmonary edema versus atypical pneumonia PCT 5/3 0.44   Plan  Check CXR now .  CXR in am  Tr wbc /temp  ?ID consult  Consider additional ABX coverage   5.  Myeloma/chronic leukopenia  -Immunocompromise state.  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   Plan  Appreciate Oncology input      Best practice:  Diet: N.p.o. Pain/Anxiety/Delirium protocol (if indicated):  Fent/Versed prn  VAP protocol (if indicated): ordered  DVT prophylaxis: SCDs GI prophylaxis: PPI  Glucose control: Currently not indicated Mobility: Bedrest Code Status: No CPR but intubation Family Communication: Family updated by Dr. Lonny Prude 5/4  Disposition: ICU   Labs   CBC: Recent Labs  Lab 07/08/2018 2048 06/19/18 0412 06/20/18 0510 06/21/18 0647  WBC 5.1 5.3 4.1 3.7*  NEUTROABS 3.3  --   --   --   HGB 11.0* 11.3* 10.5* 10.8*  HCT 31.9* 34.0* 32.1* 32.1*  MCV 95.8 96.3 97.9 97.3  PLT 232 233 202 287    Basic Metabolic Panel: Recent Labs  Lab 07/09/2018 2317 06/19/18 0412 06/19/18 1315 06/20/18 0510 06/20/18 1603  06/21/18 0305 06/21/18 0647  NA  --  130* 129* 132* 126* 135 134*  K  --  3.4* 3.1* 2.3* 2.5* 4.0 3.2*  CL  --  98 98 104 99 107 107  CO2  --  24 23 19* 18* 17* 17*  GLUCOSE  --  102* 83 82 419* 87 84  BUN  --  15 14 13 14 12 10   CREATININE  --  0.62 0.72 0.69 0.92 0.82 0.79  CALCIUM  --  7.7* 7.8* 7.1* 6.2* 7.8* 7.6*  MG 2.0 2.0  --   --   --  2.1  --    GFR: Estimated Creatinine Clearance: 82.3 mL/min (by C-G formula based on SCr  of 0.79 mg/dL). Recent Labs  Lab 07/15/2018 2048 07/12/2018 2317 06/19/18 0412 06/20/18 0510 06/20/18 1857 06/21/18 0647  PROCALCITON <0.10  --   --   --  0.44  --   WBC 5.1  --  5.3 4.1  --  3.7*  LATICACIDVEN 1.5 0.7  --   --   --   --     Liver Function Tests: Recent Labs  Lab 07/06/2018 2048 06/21/18 0305  AST 25 43*  ALT 16 22  ALKPHOS 78 66  BILITOT 0.9 1.0  PROT 6.8 5.9*  ALBUMIN 3.7 3.1*   No results for input(s): LIPASE, AMYLASE in the last 168 hours. Recent Labs  Lab 06/20/18 1732  AMMONIA 18    ABG    Component Value Date/Time   PHART 7.432 06/21/2018 0202   PCO2ART 29.1 (L) 06/21/2018 0202   PO2ART 74.1 (L) 06/21/2018 0202   HCO3 18.9 (L) 06/21/2018 0202   ACIDBASEDEF 3.7 (H) 06/21/2018 0202   O2SAT 94.3 06/21/2018 0202     Coagulation Profile: No results for input(s): INR, PROTIME in the last 168 hours.  Cardiac Enzymes: Recent Labs  Lab 06/19/18 1353 06/20/18 0510 06/20/18 1857  CKTOTAL 331* 465* 602*  TROPONINI  --   --  0.03*    HbA1C: Hgb A1c MFr Bld  Date/Time Value Ref Range Status  04/27/2017 03:51 PM 5.4 <5.7 % of total Hgb Final    Comment:    For the purpose of screening for the presence of diabetes: . <5.7%       Consistent with the absence of diabetes 5.7-6.4%    Consistent with increased risk for diabetes             (prediabetes) > or =6.5%  Consistent with diabetes . This assay result is consistent with a decreased risk of diabetes. . Currently, no consensus exists regarding use of  hemoglobin A1c for diagnosis of diabetes in children. . According to American Diabetes Association (ADA) guidelines, hemoglobin A1c <7.0% represents optimal control in non-pregnant diabetic patients. Different metrics may apply to specific patient populations.  Standards of Medical Care in Diabetes(ADA). .     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   Surgical History    Past Surgical History:  Procedure Laterality Date  . BASAL CELL CARCINOMA EXCISION  2013   removed from nose  . CATARACT EXTRACTION  09/2007  . LAPAROSCOPIC ENDOMETRIOSIS FULGURATION  1979     Social History   reports that she has never smoked. She has never used smokeless tobacco. She reports that she does not drink alcohol or use drugs.   Family History   Her family history includes Breast cancer in an other family member; CVA in her mother; Cancer in her sister; Colon cancer in her father; Diabetes in her brother and sister; Heart attack in her father; Hyperlipidemia in her sister; Hypertension in her sister; Melanoma in her sister.   Allergies Allergies  Allergen Reactions  . Latex Rash    REACTION: Rash  REACTION: Rash  . Other Rash  . Tape Rash  . Fosamax [Alendronate Sodium] Other (See Comments)    GI upset  Home Medications  Prior to Admission medications   Medication Sig Start Date End Date Taking? Authorizing Provider  AMBULATORY NON FORMULARY MEDICATION Take 1 tablet by mouth daily. Medication Name: digestive enzymes    Yes [provider]  amoxicillin-clavulanate (AUGMENTIN) 875-125 MG tablet Take 1 tablet by mouth 2  (two) times daily. 06/16/18  Yes Hali Marry, MD  aspirin-acetaminophen-caffeine (EXCEDRIN MIGRAINE) [phone] MG tablet Take 1 tablet by mouth every 6 (six) hours as needed for headache.   Yes [provider]  Bioflavonoid Products (ESTER-C) 500-550 MG TABS Take 2 tablets by mouth daily.   Yes [provider]  CALCIUM & MAGNESIUM CARBONATES PO Take 4 capsules by mouth daily.    Yes [provider]  FLUoxetine (PROZAC) 10 MG capsule 3 cap po QD x 7 days, then 2 caps QD x 7 days, 1 cap po QD x 7 days, then stop. Patient taking differently: Take 10-30 mg by mouth See admin instructions. 3 cap po QD x 7 days, then 2 caps QD x 7 days, 1 cap po QD x 7 days, then stop. 06/15/18  Yes Hali Marry, MD  hydroxypropyl methylcellulose / hypromellose (ISOPTO TEARS / GONIOVISC) 2.5 % ophthalmic solution Place 1 drop into both eyes 3 (three) times daily as needed for dry eyes.   Yes [provider]  naproxen (NAPROSYN) 500 MG tablet Take 1 tablet with each sumatriptan as directed. Patient taking differently: Take 500 mg by mouth 2 (two) times daily as needed for moderate pain. Take 1 tablet with each sumatriptan as directed. 10/07/17  Yes Jaffe, Adam R, DO  OVER THE COUNTER MEDICATION Take 1 tablet by mouth daily. Nuclo Immune   Yes [provider]  promethazine (PHENERGAN) 25 MG tablet Take 1 tablet (25 mg total) by mouth every 8 (eight) hours as needed for nausea or vomiting. 06/16/18  Yes Hali Marry, MD  SUMAtriptan (IMITREX) 100 MG tablet Take 100 mg by mouth as needed for migraine.   Yes [provider]  Triprolidine-Pseudoephedrine (HISTA-TABS PO) Take 1 tablet by mouth 2 (two) times daily as needed. Allergies   Yes [provider]  vitamin B-12 (CYANOCOBALAMIN) 1000 MCG tablet Take 1,000 mcg by mouth daily. Increase to 2000 mcg if needed   Yes [provider]  Vitamin D, Cholecalciferol, 50 MCG (2000 UT) CAPS  Take 2,000 Units by mouth daily.   Yes [provider]  Vitamins A & D 10000-400 units TABS Take 2 tablets by mouth daily.   Yes [provider]  Zinc 25 MG TABS Take 50 mg by mouth daily.   Yes [provider]  nortriptyline (PAMELOR) 75 MG capsule TAKE 1 CAPSULE BY MOUTH AT BEDTIME Patient taking differently: Take 75 mg by mouth at bedtime.  05/24/18   Pieter Partridge, DO     Critical care time:  4 min       Madelene Kaatz NP-C  [facility]  [phone]  06/21/2018

## 2018-06-21 NOTE — CV Procedure (Signed)
Intubation Procedure Note Nancy Parks [id number] Oct 23, 1950  Procedure: Intubation Indications: Respiratory insufficiency  Procedure Details Consent: Unable to obtain consent because of altered level of consciousness. Time Out: Verified patient identification, verified procedure, site/side was marked, verified correct patient position, special equipment/implants available, medications/allergies/relevent history reviewed, required imaging and test results available.  Performed  Maximum sterile technique was used including antiseptics, cap, gloves, gown, hand hygiene and mask.  3    Evaluation Hemodynamic Status: BP stable throughout; O2 sats: stable throughout Patient's Current Condition: stable Complications: No apparent complications Patient did tolerate procedure well. Chest X-ray ordered to verify placement.  CXR: tube position acceptable.   Adewale A Olalere 06/21/2018

## 2018-06-21 NOTE — Progress Notes (Signed)
[place] Progress Note Patient Name: Nancy Parks DOB: Jun 03, 1950 MRN: [id number]   Date of Service  06/21/2018  HPI/Events of Note  Potassium 3 and the patient is n.p.o.  eICU Interventions  Ordered both IV and oral potassium replacement.  Changed insulin sliding scale and point-of-care glucose checks to every 6 hours as the patient is in contact and droplet isolation.      Intervention Category Intermediate Interventions: Electrolyte abnormality - evaluation and management;Communication with other healthcare providers and/or family;Medication change / dose adjustment;Hyperglycemia - evaluation and treatment Evaluation Type: Other  Ephriam Jenkins Jenaveve Fenstermaker 06/21/2018, 11:25 PM

## 2018-06-21 NOTE — Progress Notes (Addendum)
NEUROLOGY PROGRESS NOTE  Subjective: Patient at this time is only responsive to my attempt to open her eyes.  She is having difficulty breathing although satting well.  Critical care is in the room and due to airway protection they are going to intubate; her family is agreeable to it as she is a DNR.  She has had 2 rapid COVID test which have come back negative and 1 send out which has not come back.  Exam: Vitals:   06/21/18 0839 06/21/18 0851  BP:    Pulse: (!) 110 (!) 110  Resp: (!) 26 (!) 22  Temp:    SpO2: 97% 96%    Physical Exam   HEENT-  Normocephalic, no lesions, without obvious abnormality.  Normal external eye and conjunctiva.   Extremities- Warm, dry and intact Musculoskeletal-no joint tenderness, deformity or swelling Skin-warm and dry, no hyperpigmentation, vitiligo, or suspicious lesions  Neuro:  Mental Status: Patient does not respond to verbal stimuli.  Does  respond to deep sternal rub minimally.  Keeps eyes shut when attempting to look at pupils.  Does not follow commands.  No verbalizations noted.  Cranial Nerves: II: patient does not respond confrontation bilaterally,  III,IV,VI: doll's response present  bilaterally. pupils right 2 mm, left 2 mm,and reactive bilaterally V,VII: corneal reflex present bilaterally  VIII: patient does not respond to verbal stimuli IX,X: gag reflex present, XI: trapezius strength unable to test bilaterally XII: tongue strength unable to test Motor: Extremities flaccid throughout.  No spontaneous movement noted.  No purposeful movements noted.  No neck rigidity noted Sensory: No significant if any response to not stimuli  Deep Tendon Reflexes:  Absent throughout. Plantars: absent bilaterally Cerebellar:    Medications:  Scheduled: . dextrose      . insulin aspart  0-9 Units Subcutaneous TID WC  . sodium chloride (PF)      . sodium chloride flush  3 mL Intravenous Q12H  . thiamine injection  100 mg Intravenous TID    Continuous: . sodium chloride 100 mL/hr at 06/21/18 0847  . acyclovir Stopped (06/21/18 0855)  . ampicillin (OMNIPEN) IV Stopped (06/21/18 0754)  . potassium chloride 100 mL/hr at 06/21/18 0847   JQZ:[id number] **OR** acetaminophen, haloperidol lactate, hydrALAZINE, ondansetron **OR** ondansetron (ZOFRAN) IV  Pertinent Labs/Diagnostics: Sodium 134,  potassium has resolved from 2.5 back up to 3.2,  calcium 7.6 Urinalysis negative C-reactive protein 11.1 which is increased from 6.51-day ago CK 602 which has increased from 465 yesterday   US Soft Tissue Head & Neck (non-thyroid)  Result Date: 06/19/2018 CLINICAL DATA:  Neck mass lateral to trachea EXAM: ULTRASOUND OF HEAD/NECK SOFT TISSUES TECHNIQUE: Ultrasound examination of the head and neck soft tissues was performed in the area of clinical concern. COMPARISON:  None. FINDINGS: No mass, cyst, adenopathy, aneurysm, abscess, or other pathologic findings on survey interrogation of the region of concern and contralateral region. IMPRESSION: No pathologic findings on ultrasound. Electronically Signed   By: Lucrezia Europe M.D.   On: 06/19/2018 12:28   Ct Abdomen Pelvis W Contrast  Result Date: 06/21/2018 CLINICAL DATA:  Nausea, vomiting, fever EXAM: CT ABDOMEN AND PELVIS WITH CONTRAST TECHNIQUE: Multidetector CT imaging of the abdomen and pelvis was performed using the standard protocol following bolus administration of intravenous contrast. CONTRAST:  156m OMNIPAQUE IOHEXOL 300 MG/ML  SOLN COMPARISON:  None. FINDINGS: Lower chest: Small bilateral pleural effusions with bilateral lower lobe airspace opacities. Heart is normal size. Hepatobiliary: No focal hepatic abnormality. Gallbladder grossly unremarkable. Pancreas: No focal  abnormality or ductal dilatation. Spleen: No focal abnormality.  Normal size. Adrenals/Urinary Tract: Mild-to-moderate bilateral hydronephrosis. Urinary bladder is distended. No visible stones. Adrenal glands unremarkable.  Stomach/Bowel: Normal appendix. Stomach, large and small bowel grossly unremarkable. Vascular/Lymphatic: Aortic atherosclerosis. No enlarged abdominal or pelvic lymph nodes. Reproductive: No pelvic mass. Other: Trace free fluid in the pelvis.  No free air. Musculoskeletal: No acute bony abnormality. IMPRESSION: Mild-to-moderate bilateral hydronephrosis. Urinary bladder is moderately distended. No visible obstructing stones. Small bilateral pleural effusions. Bilateral lower lobe atelectasis or pneumonia. Aortic atherosclerosis. Trace free fluid in the pelvis. Electronically Signed   By: Rolm Baptise M.D.   On: 06/21/2018 00:35   Dg Chest Port 1 View  Result Date: 06/20/2018 CLINICAL DATA:  Shortness of breath, dyspnea EXAM: PORTABLE CHEST 1 VIEW COMPARISON:  06/19/2018 FINDINGS: There is hazy bibasilar airspace disease which may reflect atelectasis versus pneumonia. There is no pneumothorax. There is a trace left pleural effusion. The heart and mediastinal contours are unremarkable. The osseous structures are unremarkable. IMPRESSION: 1. Hazy bibasilar airspace disease which may reflect atelectasis versus pneumonia. Electronically Signed   By: Kathreen Devoid   On: 06/20/2018 16:39   Dg Fl Guided Lumbar Puncture  Result Date: 06/20/2018 CLINICAL DATA:  Altered mental status, fever EXAM: DIAGNOSTIC LUMBAR PUNCTURE UNDER FLUOROSCOPIC GUIDANCE FLUOROSCOPY TIME:  Fluoroscopy Time:  0 minutes 13 seconds Radiation Exposure Index (if provided by the fluoroscopic device): 1.9 mGy Number of Acquired Spot Images: 1 PROCEDURE: Procedure, benefits, and risks were discussed with the patient's healthcare power of attorney, Lamont Snowball, including alternatives. Her questions were answered. Written informed consent was obtained. Timeout protocol followed. Patient placed prone. L4-L5 disc space was localized under fluoroscopy. Skin prepped and draped in usual sterile fashion. Skin and soft tissues anesthetized with 4 mL of 1%  lidocaine. 22 needle was advanced into the spinal canal where clear colorless CSF was encountered with an opening pressure of 15.5 H2O (prone). 9 mL of CSF was obtained in 4 tubes for requested analysis. Procedure tolerated very well by patient without immediate complication. IMPRESSION: Successful fluoroscopic guided lumbar puncture as above. Electronically Signed   By: Lavonia Dana M.D.   On: 06/20/2018 14:51     Etta Quill PA-C [facility] [phone]   Assessment: 68 y.o. female with past medical history of multiple myeloma, depression who presented with 2-day history of altered mental status, headaches, fever.   1. No obvious nuchal rigidity noted on examination.  Headaches may be from her history of migraine.  However she is still febrile with infectious work-up so far negative and is not following commands. She is obtunded with shallow respirations and difficulty protecting airway.    2. LP showed prominent lymphocytes with WBCs of 44, protein elevated at 126 and glucose borderline low at 39.   3. Of note, she is immunocompromised due to her multiple myeloma. She is covered with acyclovir and ampicillin.   Recommendations: -- Continue acyclovir until HSV PCR has returned -- Routine EEG -- Critical care following -- Question viral encephalitis versus toxic metabolic encephalopathy versus serotonin syndrome as her CK continues to elevate   Electronically signed: Dr. Kerney Elbe 06/21/2018, 8:52 AM

## 2018-06-21 NOTE — Consult Note (Signed)
NAME:  Nancy Parks, MRN:  [id number], DOB:  08-20-1950, LOS: 1 ADMISSION DATE:  06/26/2018, CONSULTATION DATE: 06/21/2018 REFERRING MD: Lonny Prude, CHIEF COMPLAINT: Altered mental status  Brief History   Patient is a 68 year old female with a history of multiple myeloma admitted with progressive worsening in neurologic status  History of present illness   Patient is 68 year old white female with a history of multiple myeloma who a week or 2 ago began complaining of headache along with intermittent hallucinations.  Since then her neurologic status has progressively worsened.  Neurology is seen the patient is operating with a diagnostic impression of viral encephalitis.  Patient's been started on acyclovir.  This evening I was asked to see due to worsening agitation with accompanying perceived worsening in her respiratory status.  This all seemed to improve with low-dose 2 mg Haldol IV.  On my evaluation the patient is having an almost obstructed airway type breathing although nursing has not noticed any obstructive apneas or failure to protect her airway.    Her respiratory rate previously had jumped up to about 30 but is now down to 18-20.  Arterial blood gas showed relatively normal pH and good oxygen saturation.  Chest x-ray shows slight haze in the bases of both lungs consistent with atypical pneumonia or mild pulmonary edema.  She also was noted to have a sodium of 122 although she does continue to put out significant volumes of urine.  Potassium was down to 2.5.  Anion gap was 9.  PCO2 was 29.1.  There is been some hesitancy to administer regular Haldol due to slightly prolonged QT interval.  This is based off EKG done 07/03/2018.  Patient has spiked a temperature to 103 twice today.  MRI of the brain was normal.  Past Medical History  Multiple myeloma, chronic leukopenia, chronic depression recent changes in neurologic status.  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 rate (!) 22, weight 92.2 kg, SpO2 96 %.        Intake/Output Summary (Last 24 hours) at 06/21/2018 0350 Last data filed at 06/21/2018 0115 Gross per 24 hour  Intake 2844.96 ml  Output 2500 ml  Net 344.96 ml   Filed Weights   06/19/18 0500 06/20/18 0520  Weight: 88.9 kg 92.2 kg    Examination: General: Well-developed white female with mild respiratory distress HENT: Within normal limits Lungs: Clear to auscultation Cardiovascular: Regular rate and rhythm Abdomen: Benign scaphoid Extremities: Within normal limits Neuro: Not responsive to voice.  She is having mild tremulous activity GU: N/A  Resolved Hospital Problem list   NA  Assessment & Plan:  1.  Progressive neurologic deterioration presumably secondary to viral encephalitis: Patient on acyclovir monitored in ICU  2.  Mild degree of respiratory difficulty without agonal breathing, no witnessed obstruction.  Normal arterial blood gas  3.  Hyponatremia: Urine sodium and osmolarity.  4.  Abnormalities of chest x-ray consistent with mild pulmonary edema versus atypical pneumonia  5.  Immunocompromise state  Patient observed in ICU setting.  Will repeat EKG and use low-dose Haldol judiciously.  Patient may require Precedex.  Progressive deterioration of  neurologic status may result in need for intubation.  Currently no evidence of airway obstruction or agonal breathing.  Best practice:  Diet: N.p.o. Pain/Anxiety/Delirium protocol (if indicated): We will use Haldol 2 mg every 4 hours as needed.  Last EKG was performed 07/10/2018.  We will repeat EKG this  morning. VAP protocol (if indicated): Currently not indicated DVT prophylaxis: SCDs GI prophylaxis: Currently not indicated Glucose control: Currently not indicated Mobility: Bedrest Code Status: No CPR but intubation Family Communication: No Disposition: ICU observation  Labs   CBC: Recent Labs  Lab 07/15/2018 2048 06/19/18 0412 06/20/18 0510  WBC 5.1 5.3 4.1  NEUTROABS 3.3  --   --   HGB 11.0* 11.3* 10.5*  HCT 31.9* 34.0* 32.1*  MCV 95.8 96.3 97.9  PLT 232 233 948    Basic Metabolic Panel: Recent Labs  Lab 07/10/2018 2048 07/06/2018 2317 06/19/18 0412 06/19/18 1315 06/20/18 0510 06/20/18 1603  NA 125*  --  130* 129* 132* 126*  K 2.6*  --  3.4* 3.1* 2.3* 2.5*  CL 91*  --  98 98 104 99  CO2 24  --  24 23 19* 18*  GLUCOSE 113*  --  102* 83 82 419*  BUN 19  --  15 14 13 14   CREATININE 0.79  --  0.62 0.72 0.69 0.92  CALCIUM 8.2*  --  7.7* 7.8* 7.1* 6.2*  MG  --  2.0 2.0  --   --   --    GFR: Estimated Creatinine Clearance: 72.1 mL/min (by C-G formula based on SCr of 0.92 mg/dL). Recent Labs  Lab 06/23/2018 2048 07/12/2018 2317 06/19/18 0412 06/20/18 0510 06/20/18 1857  PROCALCITON <0.10  --   --   --  0.44  WBC 5.1  --  5.3 4.1  --   LATICACIDVEN 1.5 0.7  --   --   --     Liver Function Tests: Recent Labs  Lab 07/11/2018 2048  AST 25  ALT 16  ALKPHOS 78  BILITOT 0.9  PROT 6.8  ALBUMIN 3.7   No results for input(s): LIPASE, AMYLASE in the last 168 hours. Recent Labs  Lab 06/20/18 1732  AMMONIA 18    ABG    Component Value Date/Time   PHART 7.432 06/21/2018 0202   PCO2ART 29.1 (L) 06/21/2018 0202   PO2ART 74.1 (L) 06/21/2018 0202   HCO3 18.9 (L) 06/21/2018 0202   ACIDBASEDEF 3.7 (H) 06/21/2018 0202   O2SAT 94.3 06/21/2018 0202     Coagulation Profile: No results for input(s): INR, PROTIME in the last 168 hours.  Cardiac Enzymes: Recent Labs  Lab 06/19/18 1353 06/20/18 0510 06/20/18 1857  CKTOTAL 331* 465* 602*  TROPONINI  --   --  0.03*     HbA1C: Hgb A1c MFr Bld  Date/Time Value Ref Range Status  04/27/2017 03:51 PM 5.4 <5.7 % of total Hgb Final    Comment:    For the purpose of screening for the presence of diabetes: . <5.7%       Consistent with the absence of diabetes 5.7-6.4%    Consistent with increased risk for diabetes             (prediabetes) > or =6.5%  Consistent with diabetes . This assay result is consistent with a decreased risk of diabetes. . Currently, no consensus exists regarding use of hemoglobin A1c for diagnosis of diabetes in children. . According to American Diabetes Association (ADA) guidelines, hemoglobin A1c <7.0% represents optimal control in non-pregnant diabetic  patients. Different metrics may apply to specific patient populations.  Standards of Medical Care in Diabetes(ADA). .     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   Surgical History    Past Surgical History:  Procedure Laterality Date  . BASAL CELL CARCINOMA EXCISION  2013   removed from nose  . CATARACT EXTRACTION  09/2007  . LAPAROSCOPIC ENDOMETRIOSIS FULGURATION  1979     Social History   reports that she has never smoked. She has never used smokeless tobacco. She reports that she does not drink alcohol or use drugs.   Family History   Her family history includes Breast cancer in an other family member; CVA in her mother; Cancer in her sister; Colon cancer in her father; Diabetes in her brother and sister; Heart attack in her father; Hyperlipidemia in her sister; Hypertension in her sister; Melanoma in her sister.   Allergies Allergies  Allergen Reactions  . Latex Rash     REACTION: Rash  REACTION: Rash  . Other Rash  . Tape Rash  . Fosamax [Alendronate Sodium] Other (See Comments)    GI upset     Home Medications  Prior to Admission medications   Medication Sig Start Date End Date Taking? Authorizing Provider  AMBULATORY NON FORMULARY MEDICATION Take 1 tablet by mouth daily. Medication Name: digestive enzymes    Yes [provider]  amoxicillin-clavulanate (AUGMENTIN) 875-125 MG tablet Take 1 tablet by mouth 2 (two) times daily. 06/16/18  Yes Hali Marry, MD  aspirin-acetaminophen-caffeine (EXCEDRIN MIGRAINE) [phone] MG tablet Take 1 tablet by mouth every 6 (six) hours as needed for headache.   Yes [provider]  Bioflavonoid Products (ESTER-C) 500-550 MG TABS Take 2 tablets by mouth daily.   Yes [provider]  CALCIUM & MAGNESIUM CARBONATES PO Take 4 capsules by mouth daily.    Yes [provider]  FLUoxetine (PROZAC) 10 MG capsule 3 cap po QD x 7 days, then 2 caps QD x 7 days, 1 cap po QD x 7 days, then stop. Patient taking differently: Take 10-30 mg by mouth See admin instructions. 3 cap po QD x 7 days, then 2 caps QD x 7 days, 1 cap po QD x 7 days, then stop. 06/15/18  Yes Hali Marry, MD  hydroxypropyl methylcellulose / hypromellose (ISOPTO TEARS / GONIOVISC) 2.5 % ophthalmic solution Place 1 drop into both eyes 3 (three) times daily as needed for dry eyes.   Yes [provider]  naproxen (NAPROSYN) 500 MG tablet Take 1 tablet with each sumatriptan as directed. Patient taking differently: Take 500 mg by mouth 2 (two) times daily as needed for moderate pain. Take 1 tablet with each sumatriptan as directed. 10/07/17  Yes Jaffe, Adam R, DO  OVER THE COUNTER MEDICATION Take 1 tablet by mouth daily. Nuclo Immune   Yes [provider]  promethazine (PHENERGAN) 25 MG tablet Take 1 tablet (25 mg total) by mouth every 8 (eight) hours as needed for nausea or vomiting. 06/16/18  Yes  Hali Marry, MD  SUMAtriptan (IMITREX) 100 MG tablet Take 100 mg by mouth as needed for migraine.   Yes [provider]  Triprolidine-Pseudoephedrine (HISTA-TABS PO) Take 1 tablet by mouth 2 (two) times daily as needed. Allergies   Yes [provider]  vitamin B-12 (CYANOCOBALAMIN) 1000 MCG tablet Take 1,000 mcg by mouth daily. Increase to 2000 mcg if needed   Yes [provider]  Vitamin D, Cholecalciferol, 50 MCG (2000 UT) CAPS Take 2,000 Units by mouth daily.   Yes [provider]  Vitamins A & D 10000-400 units TABS Take 2 tablets by mouth daily.   Yes [provider]  Zinc 25 MG TABS Take 50 mg by mouth daily.   Yes [provider]  nortriptyline (PAMELOR) 75 MG capsule TAKE 1 CAPSULE BY MOUTH AT BEDTIME Patient taking differently: Take 75 mg by mouth at bedtime.  05/24/18   Pieter Partridge, DO     Critical care time: Patient evaluation chart review and overall assessment of critical status greater than 35 minutes

## 2018-06-21 NOTE — Progress Notes (Signed)
Initial Nutrition Assessment  RD working remotely.   DOCUMENTATION CODES:   Not applicable  INTERVENTION:  - will order TF: 60 ml prostat TID with Osmolite 1.5 @ 20 ml/hr to advance by 10 ml every 8 hours to reach goal rate of 40 ml/hr. - at goal rate, this regimen will provide 2040 kcal, 150 grams protein, and 731 ml free water.  - will order 15 ml liquid multivitamin per OGT/day given TF rate <42 ml/hr.  - free water flush, if desired, to be per MD/NP.   NUTRITION DIAGNOSIS:   Inadequate oral intake related to inability to eat as evidenced by NPO status.  GOAL:   Patient will meet greater than or equal to 90% of their needs  MONITOR:   Vent status, TF tolerance, Labs, Weight trends  REASON FOR ASSESSMENT:   Ventilator, Consult Enteral/tube feeding initiation and management  ASSESSMENT:   68 year old white female with a history of multiple myeloma, chronic leukopenia, and depression. She began experiencing headache and intermittent hallucinations 1-2 weeks PTA. Since then, her neurologic status has progressively worsened. Neurology is following. On the evening of 5/3 patient began having worsening agitation and a decline in respiratory status. CXR showed slight haze in the bases of both lungs consistent with atypical pneumonia or mild pulmonary edema. MRI of the brain was normal. She was noted to have a sodium of 122 mmol/l and continues to put out significant amounts of urine. Potassium was down to 2.5 mmol/l. Patient spiked a temperature to 103 twice on 5/3.    BMI indicates overweight status. Patient was admitted on 5/1. At that time she was NPO and diet advanced to Regular on 5/2 at 2:00 AM. The only documented meal completion was lunch on 5/2: 10%.   She subsequently developed worsening respiratory status and was intubated this AM. OGT placed shortly after intubation. Communicated with MD via secure chat--okay to start TF today.  Per notes: patient with febrile illness  with worsening headache and acute onset of confusion--concern for encephalitis or meningitis, worsening acute hypoxic respiratory failure, hypovolemic hyponatremia and hypokalemia--improving, mild-moderate bilateral hydronephrosis, L neck mass with ultrasound unremarkable, stable multiple myeloma, for several months patient has been experiencing worsening migraines, poor coordination, memory deficits, and visual hallucinations.  Patient tested for COVID on 5/1 and 5/3 and both were negative. Re-testing. Strep PNA and RVP were also negative.  Per chart review, current weight is 200 lb and weight on 4/28 was 184 lb. Current weight is the highest weight since prior to 12/2017.    Patient is currently intubated on ventilator support MV: 8.4 L/min as of 9:20 AM Temp (24hrs), Avg:100.8 F (38.2 C), Min:98.8 F (37.1 C), Max:103.4 F (39.7 C) Propofol: none  Medications reviewed; sliding scale novolog, 10 mEq IV KCl x6 runs 5/4, 100 mg IV thiamine TID. Labs reviewed; Na: 134 mmol/l, K: 3.2 mmol/l, Ca: 7.6 mg/dl. IVF; NS @ 100 ml/hr.      NUTRITION - FOCUSED PHYSICAL EXAM:  unable to complete at this time.  Diet Order:   Diet Order            Diet NPO time specified  Diet effective now              EDUCATION NEEDS:   No education needs have been identified at this time  Skin:  Skin Assessment: Reviewed RN Assessment  Last BM:  PTA/unknown  Height:   Ht Readings from Last 1 Encounters:  06/15/18 5' 10"  (1.778 m)  Weight:   Wt Readings from Last 1 Encounters:  06/21/18 90.9 kg    Ideal Body Weight:  68.2 kg  BMI:  Body mass index is 28.75 kg/m.  Estimated Nutritional Needs:   Kcal:  2141 kcal  Protein:  136-154 grams (1.5-1.7 grams/kg)  Fluid:  >/= 2.1 L/day     Jarome Matin, MS, RD, LDN, [facility] Inpatient Clinical Dietitian Pager # [phone] After hours/weekend pager # [phone]

## 2018-06-21 NOTE — Progress Notes (Signed)
PT Cancellation Note  Patient Details Name: Nancy Parks MRN: [id number] DOB: 1950/09/28   Cancelled Treatment:    Reason Eval/Treat Not Completed: Medical issues which prohibited therapy   Weston Anna, PT Acute Rehabilitation Services Pager: [phone] Office: [phone]

## 2018-06-22 ENCOUNTER — Inpatient Hospital Stay (HOSPITAL_COMMUNITY): Payer: Medicare HMO

## 2018-06-22 ENCOUNTER — Inpatient Hospital Stay (HOSPITAL_COMMUNITY)
Admit: 2018-06-22 | Discharge: 2018-06-22 | Disposition: A | Discharge status: Home / Self Care | Payer: Medicare HMO | Attending: Neurology | Admitting: Neurology

## 2018-06-22 LAB — COMPREHENSIVE METABOLIC PANEL
ALT: 19 U/L (ref 0–44)
AST: 29 U/L (ref 15–41)
Albumin: 2.7 g/dL — ABNORMAL LOW (ref 3.5–5.0)
Alkaline Phosphatase: 52 U/L (ref 38–126)
Anion gap: 6 (ref 5–15)
BUN: 19 mg/dL (ref 8–23)
CO2: 23 mmol/L (ref 22–32)
Calcium: 7.7 mg/dL — ABNORMAL LOW (ref 8.9–10.3)
Chloride: 106 mmol/L (ref 98–111)
Creatinine, Ser: 0.62 mg/dL (ref 0.44–1.00)
GFR calc Af Amer: >60 mL/min (ref >60)
GFR calc non Af Amer: >60 mL/min (ref >60)
Glucose, Bld: 127 mg/dL — ABNORMAL HIGH (ref 70–99)
Potassium: 3.6 mmol/L (ref 3.5–5.1)
Sodium: 135 mmol/L (ref 135–145)
Total Bilirubin: 0.7 mg/dL (ref 0.3–1.2)
Total Protein: 5.5 g/dL — ABNORMAL LOW (ref 6.5–8.1)

## 2018-06-22 LAB — GLUCOSE, CAPILLARY
Glucose-Capillary: 108 mg/dL — ABNORMAL HIGH (ref 70–99)
Glucose-Capillary: 118 mg/dL — ABNORMAL HIGH (ref 70–99)
Glucose-Capillary: 119 mg/dL — ABNORMAL HIGH (ref 70–99)
Glucose-Capillary: 122 mg/dL — ABNORMAL HIGH (ref 70–99)
Glucose-Capillary: 134 mg/dL — ABNORMAL HIGH (ref 70–99)
Glucose-Capillary: 86 mg/dL (ref 70–99)

## 2018-06-22 LAB — HSV DNA BY PCR (REFERENCE LAB): HSV 2 DNA: NEGATIVE

## 2018-06-22 LAB — CBC
HCT: 29 % — ABNORMAL LOW (ref 36.0–46.0)
Hemoglobin: 9.9 g/dL — ABNORMAL LOW (ref 12.0–15.0)
MCH: 33.2 pg (ref 26.0–34.0)
MCHC: 34.1 g/dL (ref 30.0–36.0)
MCV: 97.3 fL (ref 80.0–100.0)
Platelets: 203 10*3/uL (ref 150–400)
RBC: 2.98 MIL/uL — ABNORMAL LOW (ref 3.87–5.11)
RDW: 14.4 % (ref 11.5–15.5)
WBC: 3.3 10*3/uL — ABNORMAL LOW (ref 4.0–10.5)
nRBC: 0 % (ref 0.0–0.2)

## 2018-06-22 LAB — CORTISOL: Cortisol, Plasma: 14.3 ug/dL

## 2018-06-22 LAB — NOVEL CORONAVIRUS, NAA (HOSP ORDER, SEND-OUT TO REF LAB; TAT 18-24 HRS): SARS-CoV-2, NAA: NOT DETECTED

## 2018-06-22 LAB — MAGNESIUM: Magnesium: 2 mg/dL (ref 1.7–2.4)

## 2018-06-22 LAB — C-REACTIVE PROTEIN: CRP: 9.1 mg/dL — ABNORMAL HIGH (ref <1.0)

## 2018-06-22 LAB — MRSA PCR SCREENING: MRSA by PCR: POSITIVE — AB

## 2018-06-22 LAB — FERRITIN: Ferritin: 170 ng/mL (ref 11–307)

## 2018-06-22 LAB — TSH: TSH: 0.967 u[IU]/mL (ref 0.350–4.500)

## 2018-06-22 LAB — CK: Total CK: 254 U/L — ABNORMAL HIGH (ref 38–234)

## 2018-06-22 LAB — HERPES SIMPLEX VIRUS(HSV) DNA BY PCR: HSV 1 DNA: POSITIVE — AB

## 2018-06-22 LAB — PHOSPHORUS: Phosphorus: 2 mg/dL — ABNORMAL LOW (ref 2.5–4.6)

## 2018-06-22 MED ORDER — POTASSIUM CHLORIDE 20 MEQ/15ML (10%) PO SOLN
40.0000 meq | Freq: Once | ORAL | Status: AC
  Administered 2018-06-22: 10:00:00 40 meq
  Filled 2018-06-22: qty 30

## 2018-06-22 MED ORDER — VITAL HIGH PROTEIN PO LIQD: 1000.0000 mL | ORAL | Status: DC

## 2018-06-22 MED ORDER — FUROSEMIDE 10 MG/ML IJ SOLN
40.0000 mg | Freq: Once | INTRAMUSCULAR | Status: AC
  Administered 2018-06-22: 40 mg via INTRAVENOUS
  Filled 2018-06-22: qty 4

## 2018-06-22 MED ORDER — PRO-STAT SUGAR FREE PO LIQD: 30.0000 mL | Freq: Two times a day (BID) | ORAL | Status: DC

## 2018-06-22 NOTE — Progress Notes (Signed)
PT Cancellation Note  Patient Details Name: Nancy Parks MRN: [id number] DOB: Apr 04, 1950   Cancelled Treatment:    Reason Eval/Treat Not Completed: Medical issues which prohibited therapy. Pt is now on the vent. Will sign off. Please reorder once pt is medically ready for PT. Thanks.    Weston Anna, PT Acute Rehabilitation Services Pager: [phone] Office: [phone]

## 2018-06-22 NOTE — Progress Notes (Addendum)
Nutrition Follow-up  INTERVENTION:   -Continue Osmolite 1.5 @ 40 ml/hr with 60 ml Prostat TID via OGT. -This regimen will provide 2040 kcal, 150 grams protein, and 731 ml free water.  -Continue liquid MVI daily - free water flush, if desired, to be per MD/NP.  NUTRITION DIAGNOSIS:   Inadequate oral intake related to inability to eat as evidenced by NPO status.  Ongoing.  GOAL:   Patient will meet greater than or equal to 90% of their needs  Meeting.  MONITOR:   Vent status, TF tolerance, Labs, Weight trends  REASON FOR ASSESSMENT:   Consult Enteral/tube feeding initiation and management  ASSESSMENT:   68 year old white female with a history of multiple myeloma, chronic leukopenia, and depression. She began experiencing headache and intermittent hallucinations 1-2 weeks PTA. Since then, her neurologic status has progressively worsened. Neurology is following. On the evening of 5/3 patient began having worsening agitation and a decline in respiratory status. CXR showed slight haze in the bases of both lungs consistent with atypical pneumonia or mild pulmonary edema. MRI of the brain was normal. She was noted to have a sodium of 122 mmol/l and continues to put out significant amounts of urine. Potassium was down to 2.5 mmol/l. Patient spiked a temperature to 103 twice on 5/3.    **RD working remotely**  RD re-consulted for TF initiation. Initial assessment completed 5/4 following intubation and TF was started at that time. Osmolite 1.5 @ 20 ml/hr to advance by 10 ml every 8 hours to reach goal rate of 40 ml/hr with 60 ml Prostat TID.  Currently pt is receiving Osmolite 1.5 @ goal rate of 40 ml/hr. Vital HP ordered via TF protocol, these orders were ultimately d/c.  Weight is now consistent with weight from admission on 5/2, 196 lb. Pt is receiving IV Lasix.  Patient is currently intubated on ventilator support MV: 9.4 L/min Temp (24hrs), Avg:100.7 F (38.2 C), Min:99.1 F  (37.3 C), Max:102.6 F (39.2 C)  Medications: IV Lasix once, KCl infusion, IV Thiamine TID, Liquid MVI daily Labs reviewed:  CBGs: 108-118 Low Phos Mg WNL  Diet Order:   Diet Order            Diet NPO time specified  Diet effective now              EDUCATION NEEDS:   No education needs have been identified at this time  Skin:  Skin Assessment: Reviewed RN Assessment  Last BM:  PTA/unknown  Height:   Ht Readings from Last 1 Encounters:  06/15/18 5' 10"  (1.778 m)    Weight:   Wt Readings from Last 1 Encounters:  06/22/18 89 kg    Ideal Body Weight:  68.2 kg  BMI:  Body mass index is 28.15 kg/m.  Estimated Nutritional Needs:   Kcal:  2070  Protein:  136-154 grams (1.5-1.7 grams/kg)  Fluid:  >/= 2.1 L/day  Clayton Bibles, MS, RD, LDN [place] Dietitian Pager: [phone] After Hours Pager: [phone]

## 2018-06-22 NOTE — Progress Notes (Signed)
Novel coronavirus testing negative 5/3 SARS coronavirus 2 testing -5/1, 5/3   Negative for coronavirus Will de-escalate

## 2018-06-22 NOTE — Procedures (Signed)
ELECTROENCEPHALOGRAM REPORT   Patient: Nancy Parks       Room #: 2094 EEG No. ID: [id number] Age: 68 y.o.        Sex: female Referring Physician: Ander Slade Report Date:  06/22/2018        Interpreting Physician: Alexis Goodell  History: Nancy Parks is an 68 y.o. female with worsening altered mental status  Medications:  Zovirax, Omnipen, Insulin, MVI, Thiamine  Conditions of Recording:  This is a 21 channel routine scalp EEG performed with bipolar and monopolar montages arranged in accordance to the international 10/20 system of electrode placement. One channel was dedicated to EKG recording.  The patient is in the agitated and uncooperative state.  Description:  Artifact is prominent during the recording due to the patient's agitation, often obscuring the background rhythm. When able to be visualized the background is slow and poorly organized.   It consists of a low voltage, mixture of polymorphic delta and theta rhythms.  This activity is diffusely distributed and continuous.  There is no change in the background rhythm with stimulation.   No epileptiform activity is noted.   Hyperventilation and intermittent photic stimulation were not performed.  IMPRESSION: This is an abnormal EEG secondary to general background slowing.  This finding may be seen with a diffuse disturbance that is etiologically nonspecific, but may include a metabolic encephalopathy, among other possibilities.  No epileptiform activity was noted.     Alexis Goodell, MD Neurology [phone] 06/22/2018, 3:56 PM

## 2018-06-22 NOTE — Progress Notes (Signed)
OT Cancellation Note  Patient Details Name: Nancy Parks MRN: [id number] DOB: 1950/04/30   Cancelled Treatment:    Reason Eval/Treat Not Completed: Medical issues which prohibited therapy.  Pt is now on a vent. Please reorder OT when pt is extubated and would benefit from OT. Thank you  Jahmani Staup 06/22/2018, 7:37 AM  Lesle Chris, OTR/L Acute Rehabilitation Services [phone] [facility] pager [phone] office 06/22/2018

## 2018-06-22 NOTE — Progress Notes (Addendum)
NAME:  Nancy Parks, MRN:  [id number], DOB:  Jun 09, 1950, LOS: 2 ADMISSION DATE:  07/12/2018, CONSULTATION DATE: 06/21/2018 REFERRING MD: Lonny Prude, CHIEF COMPLAINT: Altered mental status  Brief History   Patient is a 68 year old female with a history of multiple myeloma admitted with progressive worsening in neurologic status.  2 week hx of headaches with intermittent hallucinations.  Neurology following, differential of viral encephalitis. Pt remains intermittently febrile (to 103).   Past Medical History  Multiple myeloma Chronic leukopenia Depression Recent changes in neurologic status  Significant Hospital Events   5/01 Admit with AMS 5/03 Tx to ICU, intubated  Consults:  Neurology  PCCM 5/4  Procedures:  ETT 5/4 >>  TLC 5/4 >>   Significant Diagnostic Tests:  MRI Head 4/27 PTA >> no acute findings , no change from 2019  CT head 5/1 >> no acute findings  CT Chest 5/2 >> neg PE, LLL 8 mm nodule US Neck 5/2 >>no acute process  CT ABD / Pelvis 5/3 >> mild to mod bilateral hydronephrosis, mod distended bladder, sm effusion, BB atx, tr free fluid in pelvis  LP 5/3 >> clear fluid, glucose 39, WBC 44, neutrophils 7, RBC 6, Lymph 70, total protein 126, other cells present    Micro Data:  5/03 BC x 2 >> 5/01 BC x 2 >>  5/03 LP CSF >> cell count 44, predom. Lymphocytes , elevated protein 126 5/01 COVID >> NEGATIVE  5/03 COVID >> NEGATIVE  5/04 Strep Pneu Ag >> NEG  5/04 Legionella >>  5/03 Viral panel >> NEG  5/03 COVID (send out ) >>  5/03 HSV PCR >>  5/03 RPR >>    Antimicrobials:  Acyclovir 5/2 (empiric viral encephalitis) >>  Ampicillin 5/3 (empiric listeria) >>   Interim history / subjective:  Tmax 100.9.  I/O - 3.2L UOP in last 24 hours / 2L positive for last 24 hours. Tolerating PSV 5/5.   Objective   Blood pressure 138/68, pulse 80, temperature 99.9 F (37.7 C), temperature source Bladder, resp. rate 19, weight 89 kg, SpO2 98 %.    Vent Mode: PRVC FiO2 (%):   [40 %-60 %] 40 % Set Rate:  [18 bmp] 18 bmp Vt Set:  [480 mL-540 mL] 480 mL PEEP:  [5 cmH20] 5 cmH20 Plateau Pressure:  [11 cmH20-18 cmH20] 11 cmH20   Intake/Output Summary (Last 24 hours) at 06/22/2018 0905 Last data filed at 06/22/2018 0940 Gross per 24 hour  Intake 2739.01 ml  Output 1900 ml  Net 839.01 ml   Filed Weights   06/20/18 0520 06/21/18 0500 06/22/18 0500  Weight: 92.2 kg 90.9 kg 89 kg    Examination: General: adult female lying in bed in NAD  HEENT: MM pink/moist, ETT, R IJ TLC c/d/i Neuro: awakens, gives thumbs up / wiggles toes to commands, nods no to pain CV: s1s2 rrr, no m/r/g PULM: even/non-labored, lungs bilaterally clear  GI: soft, non-tender, bsx4 active  Extremities: warm/dry, trace edema in hands  Skin: no rashes or lesions  Resolved Hospital Problem list      Assessment & Plan:   Acute Progressive Neurologic Deterioration  -presumably secondary to viral encephalitis. Baseline immune suppression. CSF with increased lymphocytes c/w meningitis.  P: Neurology following, appreciate input Continue acyclovir + ampicillin Defer duration of ampicillin to Neurology  Follow LP cultures / studies   Acute Hypoxic Respiratory Failure -intubated 5/4 for inability to protect airway  Small Pleural Effusions P: PRVC 8cc/kg as rest mode  PSV wean as tolerated  Neuro status will dictate extubation timing Follow intermittent CXR  Lasix 40 mg IV x1 with KCL  Hyponatremia  Hypokalemia  -hypotonic (by serum osm), euvolemic on exam (urine osm 414) P: Assess cortisol, TSH Trend BMP / urinary output Replace electrolytes as indicated Avoid nephrotoxic agents, ensure adequate renal perfusion  Febrile Illness P: Follow WBC trend / fever curve  Consider ID consultation    Continue empiric abx, antivirals for now   Myeloma/chronic leukopenia   -baseline immunocompromised state -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, 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  P: Assess LDH   Monitor / supportive care  At Risk Malnutrition  P: Begin TF     Best practice:  Diet: NPO / TF Pain/Anxiety/Delirium protocol (if indicated):  Fent/Versed prn  VAP protocol (if indicated): ordered  DVT prophylaxis: SCDs GI prophylaxis: PPI  Glucose control: Currently not indicated Mobility: Bedrest Code Status: No CPR but intubation Family Communication: Called sister for update 5/5 but no answer, message left for return call Disposition: ICU   Labs   CBC: Recent Labs  Lab 07/09/2018 2048 06/19/18 0412 06/20/18 0510 06/21/18 0647 06/22/18 0521  WBC 5.1 5.3 4.1 3.7* 3.3*  NEUTROABS 3.3  --   --   --   --   HGB 11.0* 11.3* 10.5* 10.8* 9.9*  HCT 31.9* 34.0* 32.1* 32.1* 29.0*  MCV 95.8 96.3 97.9 97.3 97.3  PLT 232 233 202 208 726    Basic Metabolic Panel: Recent Labs  Lab 07/12/2018 2317 06/19/18 0412  06/21/18 0305 06/21/18 0647 06/21/18 1307 06/21/18 2100 06/22/18 0521  NA  --  130*   < > 135 134* 135 134* 135  K  --  3.4*   < > 4.0 3.2* 3.9 3.0* 3.6  CL  --  98   < > 107 107 107 107 106  CO2  --  24   < > 17* 17* 21* 21* 23  GLUCOSE  --  102*   < > 87 84 95 125* 127*  BUN  --  15   < > _0 CREATININE  --  0.62   < > 0.82 0.79 0.67 0.64 0.62  CALCIUM  --  7.7*   < > 7.8* 7.6* 7.4* 7.4* 7.7*  MG 2.0 2.0  --  2.1  --   --   --  2.0  PHOS  --   --   --   --   --   --   --  2.0*   < > = values in this interval not displayed.   GFR: Estimated Creatinine Clearance: 81.5 mL/min (by C-G formula based on SCr of 0.62 mg/dL). Recent Labs  Lab 07/02/2018 2048 07/01/2018 2317 06/19/18 0412 06/20/18 0510 06/20/18 1857 06/21/18 0647 06/22/18 0521  PROCALCITON <0.10  --   --   --  0.44  --   --   WBC 5.1  --  5.3 4.1  --  3.7* 3.3*  LATICACIDVEN 1.5 0.7  --   --   --   --   --     Liver Function Tests: Recent Labs  Lab 07/04/2018 2048 06/21/18 0305 06/22/18 0521  AST 25 43* 29  ALT _1 ALKPHOS 78 66 52  BILITOT 0.9 1.0 0.7  PROT 6.8 5.9*  5.5*  ALBUMIN 3.7 3.1* 2.7*   No results for input(s): LIPASE, AMYLASE in the last 168 hours. Recent Labs  Lab 06/20/18 1732  AMMONIA 18    ABG  Component Value Date/Time   PHART 7.474 (H) 06/21/2018 1030   PCO2ART 27.0 (L) 06/21/2018 1030   PO2ART 105 06/21/2018 1030   HCO3 19.6 (L) 06/21/2018 1030   ACIDBASEDEF 2.8 (H) 06/21/2018 1030   O2SAT 98.3 06/21/2018 1030     Coagulation Profile: No results for input(s): INR, PROTIME in the last 168 hours.  Cardiac Enzymes: Recent Labs  Lab 06/19/18 1353 06/20/18 0510 06/20/18 1857 06/22/18 0521  CKTOTAL 331* 465* 602* 254*  TROPONINI  --   --  0.03*  --     HbA1C: Hgb A1c MFr Bld  Date/Time Value Ref Range Status  04/27/2017 03:51 PM 5.4 <5.7 % of total Hgb Final    Comment:    For the purpose of screening for the presence of diabetes: . <5.7%       Consistent with the absence of diabetes 5.7-6.4%    Consistent with increased risk for diabetes             (prediabetes) > or =6.5%  Consistent with diabetes . This assay result is consistent with a decreased risk of diabetes. . Currently, no consensus exists regarding use of hemoglobin A1c for diagnosis of diabetes in children. . According to American Diabetes Association (ADA) guidelines, hemoglobin A1c <7.0% represents optimal control in non-pregnant diabetic patients. Different metrics may apply to specific patient populations.  Standards of Medical Care in Diabetes(ADA). .     CBG: Recent Labs  Lab 06/21/18 1949 06/21/18 2023 06/22/18 0008 06/22/18 0532 06/22/18 0807  GLUCAP 68* 109* 119* 108* 118*     Critical care time:  32 minutes     Noe Gens, NP-C [facility] Pgr: [phone] or if no answer [phone] 06/22/2018, 9:05 AM

## 2018-06-22 NOTE — Progress Notes (Signed)
EEG completed, results pending. 

## 2018-06-23 ENCOUNTER — Inpatient Hospital Stay (HOSPITAL_COMMUNITY): Payer: Medicare HMO

## 2018-06-23 ENCOUNTER — Other Ambulatory Visit: Payer: Self-pay | Admitting: Family Medicine

## 2018-06-23 DIAGNOSIS — C9000 Multiple myeloma not having achieved remission: Secondary | ICD-10-CM

## 2018-06-23 DIAGNOSIS — Z888 Allergy status to other drugs, medicaments and biological substances status: Secondary | ICD-10-CM

## 2018-06-23 DIAGNOSIS — Z91048 Other nonmedicinal substance allergy status: Secondary | ICD-10-CM

## 2018-06-23 DIAGNOSIS — B004 Herpesviral encephalitis: Secondary | ICD-10-CM

## 2018-06-23 DIAGNOSIS — F329 Major depressive disorder, single episode, unspecified: Secondary | ICD-10-CM

## 2018-06-23 DIAGNOSIS — J9601 Acute respiratory failure with hypoxia: Secondary | ICD-10-CM

## 2018-06-23 DIAGNOSIS — F419 Anxiety disorder, unspecified: Secondary | ICD-10-CM

## 2018-06-23 DIAGNOSIS — G43909 Migraine, unspecified, not intractable, without status migrainosus: Secondary | ICD-10-CM

## 2018-06-23 DIAGNOSIS — Z66 Do not resuscitate: Secondary | ICD-10-CM

## 2018-06-23 DIAGNOSIS — Z91040 Latex allergy status: Secondary | ICD-10-CM

## 2018-06-23 LAB — LEGIONELLA PNEUMOPHILA SEROGP 1 UR AG: L. pneumophila Serogp 1 Ur Ag: NEGATIVE

## 2018-06-23 LAB — CBC
HCT: 28.1 % — ABNORMAL LOW (ref 36.0–46.0)
Hemoglobin: 9.4 g/dL — ABNORMAL LOW (ref 12.0–15.0)
MCH: 32.4 pg (ref 26.0–34.0)
MCHC: 33.5 g/dL (ref 30.0–36.0)
MCV: 96.9 fL (ref 80.0–100.0)
Platelets: 186 10*3/uL (ref 150–400)
RBC: 2.9 MIL/uL — ABNORMAL LOW (ref 3.87–5.11)
RDW: 14.2 % (ref 11.5–15.5)
WBC: 1.6 10*3/uL — ABNORMAL LOW (ref 4.0–10.5)
nRBC: 0 % (ref 0.0–0.2)

## 2018-06-23 LAB — GLUCOSE, CAPILLARY
Glucose-Capillary: 134 mg/dL — ABNORMAL HIGH (ref 70–99)
Glucose-Capillary: 96 mg/dL (ref 70–99)
Glucose-Capillary: 118 mg/dL — ABNORMAL HIGH (ref 70–99)
Glucose-Capillary: 111 mg/dL — ABNORMAL HIGH (ref 70–99)

## 2018-06-23 LAB — C-REACTIVE PROTEIN: CRP: 6.4 mg/dL — ABNORMAL HIGH (ref <1.0)

## 2018-06-23 LAB — COMPREHENSIVE METABOLIC PANEL
ALT: 18 U/L (ref 0–44)
AST: 25 U/L (ref 15–41)
Albumin: 2.5 g/dL — ABNORMAL LOW (ref 3.5–5.0)
Alkaline Phosphatase: 52 U/L (ref 38–126)
Anion gap: 7 (ref 5–15)
BUN: 20 mg/dL (ref 8–23)
CO2: 25 mmol/L (ref 22–32)
Calcium: 7.5 mg/dL — ABNORMAL LOW (ref 8.9–10.3)
Chloride: 102 mmol/L (ref 98–111)
Creatinine, Ser: 0.61 mg/dL (ref 0.44–1.00)
GFR calc Af Amer: >60 mL/min (ref >60)
GFR calc non Af Amer: >60 mL/min (ref >60)
Glucose, Bld: 148 mg/dL — ABNORMAL HIGH (ref 70–99)
Potassium: 2.9 mmol/L — ABNORMAL LOW (ref 3.5–5.1)
Sodium: 134 mmol/L — ABNORMAL LOW (ref 135–145)
Total Bilirubin: 0.2 mg/dL — ABNORMAL LOW (ref 0.3–1.2)
Total Protein: 5.2 g/dL — ABNORMAL LOW (ref 6.5–8.1)

## 2018-06-23 LAB — FERRITIN: Ferritin: 125 ng/mL (ref 11–307)

## 2018-06-23 LAB — LACTATE DEHYDROGENASE: LDH: 211 U/L — ABNORMAL HIGH (ref 98–192)

## 2018-06-23 LAB — MAGNESIUM: Magnesium: 1.7 mg/dL (ref 1.7–2.4)

## 2018-06-23 MED ORDER — VITAMIN B-1 100 MG PO TABS
100.0000 mg | ORAL_TABLET | Freq: Every day | ORAL | Status: DC
  Administered 2018-06-24 – 2018-06-27 (×5): 100 mg
  Filled 2018-06-23 (×4): qty 1

## 2018-06-23 MED ORDER — ACETAMINOPHEN 160 MG/5ML PO SOLN
650.0000 mg | Freq: Four times a day (QID) | ORAL | Status: DC | PRN
  Administered 2018-06-23 – 2018-06-27 (×8): 650 mg
  Filled 2018-06-23 (×8): qty 20.3

## 2018-06-23 MED ORDER — POTASSIUM CHLORIDE 20 MEQ/15ML (10%) PO SOLN
40.0000 meq | Freq: Once | ORAL | Status: AC
  Administered 2018-06-23: 09:00:00 40 meq
  Filled 2018-06-23: qty 30

## 2018-06-23 MED ORDER — TBO-FILGRASTIM 480 MCG/0.8ML ~~LOC~~ SOSY
480.0000 ug | PREFILLED_SYRINGE | Freq: Every day | SUBCUTANEOUS | Status: DC
  Administered 2018-06-23 – 2018-06-24 (×2): 480 ug via SUBCUTANEOUS
  Filled 2018-06-23 (×2): qty 0.8

## 2018-06-23 NOTE — Progress Notes (Signed)
Pharmacy Antibiotic Note  Nancy Parks is a 68 y.o. female presented to the ED on 07/14/2018 with c/o fever, headache and AMS. She recently started on Augmentin PTA for suspected sinus tract infection. COVID test completed on admission was negative. Neurologist consulted and differential includes viral encephalitis vs meningitis vs Serotonin syndrome. Plan for LP by IR on 5/3 and to start broad abx with vancomycin, ceftriaxone, ampicillin and acyclovir for r/o meningitis. 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 Day #5/21 acyclovir  WBC continues to decline, 1.6 today SCr WNL at 0.61 CRP 9.1>6.4 Tmax 100.9   Plan:  Continue acyclovir 890 mg q8h (10 mgkg q8h) for HSV encephalitis  F/u WBC, renal function   _______________________________________  Weight: 191 lb 9.3 oz (86.9 kg)  Temp (24hrs), Avg:100.3 F (37.9 C), Min:99.1 F (37.3 C), Max:100.9 F (38.3 C)  Recent Labs  Lab 07/10/2018 2048 07/08/2018 2317 06/19/18 0412  06/20/18 0510  06/21/18 0647 06/21/18 1307 06/21/18 2100 06/22/18 0521 06/23/18 0500  WBC 5.1  --  5.3  --  4.1  --  3.7*  --   --  3.3* 1.6*  CREATININE 0.79  --  0.62   < > 0.69   < > 0.79 0.67 0.64 0.62 0.61  LATICACIDVEN 1.5 0.7  --   --   --   --   --   --   --   --   --    < > = values in this interval not displayed.    Estimated Creatinine Clearance: 80.6 mL/min (by C-G formula based on SCr of 0.61 mg/dL).    Allergies  Allergen Reactions  . Latex Rash    REACTION: Rash  REACTION: Rash  . Other Rash  . Tape Rash  . Fosamax [Alendronate Sodium] Other (See Comments)    GI upset    Antimicrobials this admission:  5/2 vanc>> 5/3 5/2 CTX>> 5/3 Cefepime >> 5/3 5/2 ampicillin>>5/6 5/2 acyclovir>>  Dose adjustments this admission:   Microbiology results:  5/1 BCx x2: ngtd 5/1 covid: negative 5/3 covid: negative 5/3 Covid sent out: negataive 5/2 HIV antb: NR 5/3 Flu A/B neg 5/3 RPR NR 5/3 HSV DNA  PCR CSF> HSV 1 DNA positive HSV 2 DNA negative 5/4 strep pneumo Ug negative  5/3 RVP neg 5/3 BCx2>>ngtd 5/3 CSF>>gram stain neg, ngtd 5/4 legionella>> 5/5 MRSA PCR positive   Thank you for allowing pharmacy to be a part of this patient's care.  Eudelia Bunch, Pharm.D 06/23/2018 12:16 PM

## 2018-06-23 NOTE — Progress Notes (Addendum)
NEUROLOGY PROGRESS NOTE  Subjective: Patient remains intubated, breathing over the vent, not following commands, but responding to noxious stimuli.  Exam: Vitals:   06/23/18 0742 06/23/18 0800  BP:  135/68  Pulse:  79  Resp:  17  Temp:    SpO2: 100% 100%    Physical Exam  HEENT-  Normocephalic, no lesions, without obvious abnormality.  Normal external eye and conjunctiva.   Extremities- Warm, dry and intact Musculoskeletal-no joint tenderness, deformity or swelling Skin-warm and dry, no hyperpigmentation, vitiligo, or suspicious lesions  Neuro:  Mental Status: Intubated, breathing over the vent.  Responds very quickly to noxious stimuli with withdrawal Cranial Nerves: II: Blinks to threat,  III,IV, VI: Has doll's eyes and pupillary reflexes V,VII: Face symmetric,  VIII: hearing grossly intact Motor/Sensory: Withdraws to pain with good 5/5 strength, has tremor Deep Tendon Reflexes: 2+ and symmetric throughout Plantars: Upgoing bilaterally    Medications:  Scheduled: . chlorhexidine gluconate (MEDLINE KIT)  15 mL Mouth Rinse BID  . Chlorhexidine Gluconate Cloth  6 each Topical Daily  . enoxaparin (LOVENOX) injection  40 mg Subcutaneous Q24H  . feeding supplement (OSMOLITE 1.5 CAL)  1,000 mL Per Tube Q24H  . feeding supplement (PRO-STAT SUGAR FREE 64)  60 mL Per Tube TID  . insulin aspart  0-9 Units Subcutaneous Q6H  . mouth rinse  15 mL Mouth Rinse 10 times per day  . multivitamin  15 mL Per Tube Q24H  . pantoprazole sodium  40 mg Per Tube Daily  . potassium chloride  40 mEq Per Tube Once  . sodium chloride flush  10-40 mL Intracatheter Q12H  . sodium chloride flush  3 mL Intravenous Q12H  . thiamine injection  100 mg Intravenous TID    Pertinent Labs/Diagnostics: Patient's CSF PCR came back positive for HSV 1.  EEG: IMPRESSION: This is an abnormal EEG secondary to general background slowing.  This finding may be seen with a diffuse disturbance that is  etiologically nonspecific, but may include a metabolic encephalopathy, among other possibilities.  No epileptiform activity was noted.    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 CSF PCR came back positive for HSV-1 DNA 2. Neurological exam without focal weakness.   Recommendations: -- Continue acyclovir per infectious disease.   -- At this point time neurology will sign off as ID is following.  Please call with any further questions  Electronically signed: Dr. Kerney Elbe

## 2018-06-23 NOTE — Progress Notes (Signed)
I am just very saddened as to the progress or lack of progress I guess that Nancy Parks is making.  She is now on a ventilator.  Sort of surprised by this since she is a DNR.  I guess the Lucianne Lei is just to try to protect her airway.  The spinal fluid cytology came back negative for any obvious malignancy.  It still seems like this might be some type of viral encephalitis.  She is on acyclovir.  Her labs show a white cell count to be 1.6.  Hemoglobin 9.4.  Platelet count 186,000.  This is really about her norm for her white cell count.  Given the status that she is in right now, I probably would get her on Neupogen to try to get her white cell count up to help her immune system.  I am not sure how the myeloma figures into this.  I will check her myeloma studies.  Both her albumin and total protein are low.  Maybe, if her IgG level is quite low, we can try a dose of IVIG to help her immune system.  I know that she is getting wonderful care by all the staff in the ICU.  Hopefully, she will wake up at some point so I can talk to her again.  She is incredibly interesting to talk to and has a lot of life experiences.  Lattie Haw, MD  Oswaldo Milian 26:20

## 2018-06-23 NOTE — Consult Note (Signed)
Date of Admission:  07/18/2018          Reason for Consult: HSV 1 encephalitis    Referring Provider: Dr. Ander Slade  Assessment:  1. HSV1 encephaltis 2. Multiple myeloma with chronically depressed WBC 3. ARF sp intubation  Plan:  1. Continue Acyclovir and plan for 21 day course 2. DC Ampicillin  Principal Problem:   Febrile illness Active Problems:   Migraine   Smoldering multiple myeloma (HCC)   Altered mental status   Depression with anxiety   Lung nodule seen on imaging study   Hyponatremia   Hypokalemia   Encephalitis   Acute respiratory failure with hypoxia (HCC)   Hydronephrosis   Scheduled Meds:  chlorhexidine gluconate (MEDLINE KIT)  15 mL Mouth Rinse BID   Chlorhexidine Gluconate Cloth  6 each Topical Daily   enoxaparin (LOVENOX) injection  40 mg Subcutaneous Q24H   feeding supplement (OSMOLITE 1.5 CAL)  1,000 mL Per Tube Q24H   feeding supplement (PRO-STAT SUGAR FREE 64)  60 mL Per Tube TID   insulin aspart  0-9 Units Subcutaneous Q6H   mouth rinse  15 mL Mouth Rinse 10 times per day   multivitamin  15 mL Per Tube Q24H   pantoprazole sodium  40 mg Per Tube Daily   sodium chloride flush  10-40 mL Intracatheter Q12H   sodium chloride flush  3 mL Intravenous Q12H   [START ON 06/24/2018] thiamine  100 mg Per Tube Daily   Continuous Infusions:  acyclovir Stopped (06/23/18 0913)   PRN Meds:.acetaminophen **OR** acetaminophen, albuterol, docusate, hydrALAZINE, [DISCONTINUED] ondansetron **OR** ondansetron (ZOFRAN) IV, sodium chloride flush  HPI: Nancy Parks is a 68 y.o. female with past medical history significant for IgG lambda smoldering myeloma, depression and anxiety with migraines who had had apparently a week of worsening weakness and oral intake and began having hallucinations.  Her primary care physician thought this was related to her fluoxetine which was titrated down and discontinued.  In the interim she developed worsening headaches as  well as florid confusion and fevers.  She is brought to the emergency room where an MRI of the brain was fairly unremarkable.  She had been already treated with Augmentin for presumed sinus infection.  She also had had episodes of nausea and vomiting.  She was started on antibiotics for presumed meningoencephalitis on this 1 May.  By the third she required mechanical intubation.  Lumbar puncture was performed which revealed 44 white blood cells with 52% neutrophilic predominance glucose of 36 and a protein of 126.  Herpes simplex PCR was positive and she has been continued on acyclovir.  Ampicillin was also continued though this is not a case of con commitment Listeria meningitis.  She has been extubated today and apparently is having some improvement in her neurocognitive functioning.  I would plan on treating her with 21 days of IV acyclovir.  1 could contemplating downtrending to oral Valtrex but I be very apprehensive about this approach in a patient with a fairly morbid condition such as herpes encephalitis.  My preference would be for 21 days of high-dose acyclovir.  I will sign off for now but please call me if and when she gets ready for discharge from the hospital.   Review of Systems: Review of Systems  Unable to perform ROS: Critical illness    Past Medical History:  Diagnosis Date   Depression    Headache(784.0)    Migraines   Leukopenia    Leukopenia 07/13/2012  Multiple myeloma, without mention of having achieved remission 07/13/2012   Smoldering multiple myeloma ([place]) 03/08/2018    Social History   Tobacco Use   Smoking status: Never Smoker   Smokeless tobacco: Never Used   Tobacco comment: never used tobacco  Substance Use Topics   Alcohol use: No    Alcohol/week: 0.0 standard drinks   Drug use: No    Family History  Problem Relation Age of Onset   Breast cancer Other        grandmother    Colon cancer Father        colon adenoma   Heart  attack Father    Hyperlipidemia Sister    Hypertension Sister    Cancer Sister        throat, smoker   Melanoma Sister    Diabetes Sister    CVA Mother    Diabetes Brother    Allergies  Allergen Reactions   Latex Rash    REACTION: Rash  REACTION: Rash   Other Rash   Tape Rash   Fosamax [Alendronate Sodium] Other (See Comments)    GI upset    OBJECTIVE: Blood pressure (!) 150/73, pulse 94, temperature (!) 100.8 F (38.2 C), temperature source Core, resp. rate (!) 28, weight 86.9 kg, SpO2 100 %.  Physical Exam Constitutional:      General: She is not in acute distress. HENT:     Head: Normocephalic.  Eyes:     General: No scleral icterus.       Right eye: No discharge.        Left eye: No discharge.     Extraocular Movements: Extraocular movements intact.  Cardiovascular:     Rate and Rhythm: Regular rhythm. Tachycardia present.  Pulmonary:     Breath sounds: No wheezing or rales.  Abdominal:     General: Abdomen is flat. There is no distension.  Skin:    General: Skin is warm.  Neurological:     Mental Status: She is disoriented.    She seems to nod head in response to me calling her by her first name   Lab Results Lab Results  Component Value Date   WBC 1.6 (L) 06/23/2018   HGB 9.4 (L) 06/23/2018   HCT 28.1 (L) 06/23/2018   MCV 96.9 06/23/2018   PLT 186 06/23/2018    Lab Results  Component Value Date   CREATININE 0.61 06/23/2018   BUN 20 06/23/2018   NA 134 (L) 06/23/2018   K 2.9 (L) 06/23/2018   CL 102 06/23/2018   CO2 25 06/23/2018    Lab Results  Component Value Date   ALT 18 06/23/2018   AST 25 06/23/2018   ALKPHOS 52 06/23/2018   BILITOT 0.2 (L) 06/23/2018     Microbiology: Recent Results (from the past 240 hour(s))  SARS Coronavirus 2 [facility] order, Performed in [facility] hospital lab)     Status: None   Collection Time: 07/06/2018  8:48 PM  Result Value Ref Range Status   SARS Coronavirus 2 NEGATIVE NEGATIVE Final     Comment: (NOTE) If result is NEGATIVE SARS-CoV-2 target nucleic acids are NOT DETECTED. The SARS-CoV-2 RNA is generally detectable in upper and lower  respiratory specimens during the acute phase of infection. The lowest  concentration of SARS-CoV-2 viral copies this assay can detect is 250  copies / mL. A negative result does not preclude SARS-CoV-2 infection  and should not be used as the sole basis for treatment or other  patient management decisions.  A negative result may occur with  improper specimen collection / handling, submission of specimen other  than nasopharyngeal swab, presence of viral mutation(s) within the  areas targeted by this assay, and inadequate number of viral copies  (<250 copies / mL). A negative result must be combined with clinical  observations, patient history, and epidemiological information. If result is POSITIVE SARS-CoV-2 target nucleic acids are DETECTED. The SARS-CoV-2 RNA is generally detectable in upper and lower  respiratory specimens dur ing the acute phase of infection.  Positive  results are indicative of active infection with SARS-CoV-2.  Clinical  correlation with patient history and other diagnostic information is  necessary to determine patient infection status.  Positive results do  not rule out bacterial infection or co-infection with other viruses. If result is PRESUMPTIVE POSTIVE SARS-CoV-2 nucleic acids MAY BE PRESENT.   A presumptive positive result was obtained on the submitted specimen  and confirmed on repeat testing.  While 2019 novel coronavirus  (SARS-CoV-2) nucleic acids may be present in the submitted sample  additional confirmatory testing may be necessary for epidemiological  and / or clinical management purposes  to differentiate between  SARS-CoV-2 and other Sarbecovirus currently known to infect humans.  If clinically indicated additional testing with an alternate test  methodology [phone]) is advised. The SARS-CoV-2  RNA is generally  detectable in upper and lower respiratory sp ecimens during the acute  phase of infection. The expected result is Negative. Fact Sheet for Patients:  StrictlyIdeas.no Fact Sheet for Healthcare Providers: BankingDealers.co.za This test is not yet approved or cleared by the [place] FDA and has been authorized for detection and/or diagnosis of SARS-CoV-2 by FDA under an Emergency Use Authorization (EUA).  This EUA will remain in effect (meaning this test can be used) for the duration of the COVID-19 declaration under Section 564(b)(1) of the Act, 21 U.S.C. section 360bbb-3(b)(1), unless the authorization is terminated or revoked sooner. Performed at [facility], [place] [address], [place], [place] [address]   Blood Culture (routine x 2)     Status: None (Preliminary result)   Collection Time: 07/06/2018  8:48 PM  Result Value Ref Range Status   Specimen Description   Final    BLOOD RIGHT FOREARM Performed at [place] [address], [place], [place] [address]    Special Requests   Final    BOTTLES DRAWN AEROBIC AND ANAEROBIC Blood Culture results may not be optimal due to an inadequate volume of blood received in culture bottles Performed at [place] [address], [place], [place] [address]    Culture   Final    NO GROWTH 4 DAYS Performed at [facility], [place] [address], [place], [place] [address]    Report Status PENDING  Incomplete  Blood Culture (routine x 2)     Status: None (Preliminary result)   Collection Time: 06/30/2018  8:48 PM  Result Value Ref Range Status   Specimen Description   Final    BLOOD RIGHT ANTECUBITAL Performed at [place] [address], [place], [place] [address]    Special Requests   Final    BOTTLES DRAWN AEROBIC AND ANAEROBIC Blood Culture adequate volume Performed at [place] [address], [place], [place] [address]    Culture   Final    NO GROWTH 4 DAYS Performed at [facility], [place] [address] [place], [place] [address]  Report Status PENDING  Incomplete  CSF culture     Status: None (Preliminary result)   Collection Time: 06/20/18  2:35 PM  Result Value Ref Range Status   Specimen Description   Final    CSF Performed at [place] [address] [place], [place] [address]    Special Requests   Final    NONE Performed at [facility], [place] [address] [place], [place] [address]    Gram Stain   Final    WBC PRESENT, PREDOMINANTLY MONONUCLEAR NO ORGANISMS SEEN CALLED TICKET,S RN _0  ON 06/20/2018 Seward Speck Performed at [facility], [place] [address] [place], [place] [address]    Culture   Final    NO GROWTH 2 DAYS Performed at [place] [address] [place], [place] [address]    Report Status PENDING  Incomplete  Culture, fungus without smear     Status: None (Preliminary result)   Collection Time: 06/20/18  2:35 PM  Result Value Ref Range Status   Specimen Description   Final    CSF Performed at [place] [address], [place], [place] [address]    Special Requests   Final    NONE Performed at [facility], [place] [address] [place], [place] [address]    Culture   Final    NO FUNGUS ISOLATED AFTER 2 DAYS Performed at [facility], [place] [address], [place], [place] [address]    Report Status PENDING  Incomplete  Culture, blood (routine x 2)     Status: None (Preliminary result)   Collection Time: 06/20/18  4:03 PM  Result Value Ref Range Status   Specimen Description   Final    BLOOD LEFT HAND Performed at [place] [address] [place], [place] [address]    Special Requests   Final    BOTTLES DRAWN AEROBIC ONLY Blood Culture results may not be optimal due to an  inadequate volume of blood received in culture bottles Performed at [place] [address], [place], [place] [address]    Culture   Final    NO GROWTH 2 DAYS Performed at [place] [address], [place], [place] [address]    Report Status PENDING  Incomplete  Culture, blood (routine x 2)     Status: None (Preliminary result)   Collection Time: 06/20/18  4:03 PM  Result Value Ref Range Status   Specimen Description   Final    RIGHT ANTECUBITAL Performed at [place] [address] [place], [place] [address]    Special Requests   Final    BOTTLES DRAWN AEROBIC ONLY Blood Culture adequate volume Performed at [place] [address] [place], [place] [address]    Culture   Final    NO GROWTH 2 DAYS Performed at [place] [address] [place], [place] [address]    Report Status PENDING  Incomplete  Novel Coronavirus,NAA,(SEND-OUT TO REF LAB - TAT 24-48 hrs); Hosp Order     Status: None   Collection Time: 06/20/18  5:05 PM  Result Value Ref Range Status   SARS-CoV-2, NAA NOT DETECTED NOT DETECTED Final    Comment: (NOTE) This test was developed and its performance characteristics determined by [employer]. This test has not been FDA cleared or approved. This test has been authorized by FDA under an Emergency Use Authorization (  EUA). This test is only authorized for the duration of time the declaration that circumstances exist justifying the authorization of the emergency use of in vitro diagnostic tests for detection of SARS-CoV-2 virus and/or diagnosis of COVID-19 infection under section 564(b)(1) of the Act, 21 U.S.C. 767MCN-4(B)(0), unless the authorization is terminated or revoked sooner. When diagnostic testing is negative, the possibility of a false negative result should be considered in the context of a patient's recent exposures and the presence of clinical signs  and symptoms consistent with COVID-19. An individual without symptoms of COVID-19 and who is not shedding SARS-CoV-2 virus would expect to have a negative (not detected) result in this assay. Performed  At: [facility] [place], [place] [id number] Rush Farmer MD UT:[id number]    [place]  Final    Comment: Performed at [place] [address] [place], [place] [address]  SARS Coronavirus 2 [facility] order, Performed in [facility] hospital lab)     Status: None   Collection Time: 06/20/18  5:05 PM  Result Value Ref Range Status   SARS Coronavirus 2 NEGATIVE NEGATIVE Final    Comment: (NOTE) If result is NEGATIVE SARS-CoV-2 target nucleic acids are NOT DETECTED. The SARS-CoV-2 RNA is generally detectable in upper and lower  respiratory specimens during the acute phase of infection. The lowest  concentration of SARS-CoV-2 viral copies this assay can detect is 250  copies / mL. A negative result does not preclude SARS-CoV-2 infection  and should not be used as the sole basis for treatment or other  patient management decisions.  A negative result may occur with  improper specimen collection / handling, submission of specimen other  than nasopharyngeal swab, presence of viral mutation(s) within the  areas targeted by this assay, and inadequate number of viral copies  (<250 copies / mL). A negative result must be combined with clinical  observations, patient history, and epidemiological information. If result is POSITIVE SARS-CoV-2 target nucleic acids are DETECTED. The SARS-CoV-2 RNA is generally detectable in upper and lower  respiratory specimens dur ing the acute phase of infection.  Positive  results are indicative of active infection with SARS-CoV-2.  Clinical  correlation with patient history and other diagnostic information is  necessary to determine patient infection status.  Positive results do  not  rule out bacterial infection or co-infection with other viruses. If result is PRESUMPTIVE POSTIVE SARS-CoV-2 nucleic acids MAY BE PRESENT.   A presumptive positive result was obtained on the submitted specimen  and confirmed on repeat testing.  While 2019 novel coronavirus  (SARS-CoV-2) nucleic acids may be present in the submitted sample  additional confirmatory testing may be necessary for epidemiological  and / or clinical management purposes  to differentiate between  SARS-CoV-2 and other Sarbecovirus currently known to infect humans.  If clinically indicated additional testing with an alternate test  methodology [phone]) is advised. The SARS-CoV-2 RNA is generally  detectable in upper and lower respiratory sp ecimens during the acute  phase of infection. The expected result is Negative. Fact Sheet for Patients:  StrictlyIdeas.no Fact Sheet for Healthcare Providers: BankingDealers.co.za This test is not yet approved or cleared by the [place] FDA and has been authorized for detection and/or diagnosis of SARS-CoV-2 by FDA under an Emergency Use Authorization (EUA).  This EUA will remain in effect (meaning this test can be used) for the duration of the COVID-19 declaration under Section 564(b)(1) of the Act, 21 U.S.C. section 360bbb-3(b)(1), unless  the authorization is terminated or revoked sooner. Performed at [facility], [place] [address], [place], [place] [address]   Respiratory Panel by PCR     Status: None   Collection Time: 06/20/18  5:07 PM  Result Value Ref Range Status   Adenovirus NOT DETECTED NOT DETECTED Final   Coronavirus 229E NOT DETECTED NOT DETECTED Final    Comment: (NOTE) The Coronavirus on the Respiratory Panel, DOES NOT test for the novel  Coronavirus (2019 nCoV)    Coronavirus HKU1 NOT DETECTED NOT DETECTED Final   Coronavirus NL63 NOT DETECTED NOT DETECTED Final   Coronavirus OC43 NOT  DETECTED NOT DETECTED Final   Metapneumovirus NOT DETECTED NOT DETECTED Final   Rhinovirus / Enterovirus NOT DETECTED NOT DETECTED Final   Influenza A NOT DETECTED NOT DETECTED Final   Influenza B NOT DETECTED NOT DETECTED Final   Parainfluenza Virus 1 NOT DETECTED NOT DETECTED Final   Parainfluenza Virus 2 NOT DETECTED NOT DETECTED Final   Parainfluenza Virus 3 NOT DETECTED NOT DETECTED Final   Parainfluenza Virus 4 NOT DETECTED NOT DETECTED Final   Respiratory Syncytial Virus NOT DETECTED NOT DETECTED Final   Bordetella pertussis NOT DETECTED NOT DETECTED Final   Chlamydophila pneumoniae NOT DETECTED NOT DETECTED Final   Mycoplasma pneumoniae NOT DETECTED NOT DETECTED Final    Comment: Performed at [facility], [address] [place], [place] [address]  MRSA PCR Screening     Status: Abnormal   Collection Time: 06/22/18  6:35 PM  Result Value Ref Range Status   MRSA by PCR POSITIVE (A) NEGATIVE Final    Comment:        The GeneXpert MRSA Assay (FDA approved for NASAL specimens only), is one component of a comprehensive MRSA colonization surveillance program. It is not intended to diagnose MRSA infection nor to guide or monitor treatment for MRSA infections. RESULT CALLED TO, READ BACK BY AND VERIFIED WITH: H.LETCHFORD AT 2121 ON 06/22/18 BY N.THOMPSON Performed at [facility], [place] [address] [place], [place] [address]     Alcide Evener, [place] [facility] [phone] pager  06/23/2018, 2:33 PM

## 2018-06-23 NOTE — Progress Notes (Addendum)
NAME:  Nancy Parks, MRN:  [id number], DOB:  04/20/1950, LOS: 3 ADMISSION DATE:  06/24/2018, CONSULTATION DATE: 06/21/2018 REFERRING MD: Lonny Prude, CHIEF COMPLAINT: Altered mental status  Brief History   Patient is a 68 year old female with a history of multiple myeloma admitted with progressive worsening in neurologic status.  2 week hx of headaches with intermittent hallucinations.  Neurology following, differential of viral encephalitis. Pt remains intermittently febrile (to 103).   Past Medical History  Multiple myeloma Chronic leukopenia Depression Recent changes in neurologic status  Significant Hospital Events   5/01 Admit with AMS 5/03 Tx to ICU, intubated  Consults:  Neurology  PCCM 5/4  Procedures:  ETT 5/4 >>  TLC 5/4 >>   Significant Diagnostic Tests:  MRI Head 4/27 PTA >> no acute findings , no change from 2019  CT head 5/1 >> no acute findings  CT Chest 5/2 >> neg PE, LLL 8 mm nodule US Neck 5/2 >>no acute process  CT ABD / Pelvis 5/3 >> mild to mod bilateral hydronephrosis, mod distended bladder, sm effusion, BB atx, tr free fluid in pelvis  LP 5/3 >> clear fluid, glucose 39, WBC 44, neutrophils 7, RBC 6, Lymph 70, total protein 126, other cells present  EEG 5/5 >> general background slowing  Micro Data:  5/03 BC x 2 >> 5/01 BC x 2 >>  5/03 LP CSF >> cell count 44, predom. 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  I/O - 4.8L UOP in last 24 hours, net neg 3.1L.  Afebrile.    Objective   Blood pressure 135/68, pulse 79, temperature 99.1 F (37.3 C), resp. rate 17, weight 86.9 kg, SpO2 100 %.    Vent  Mode: PSV;CPAP FiO2 (%):  [30 %-40 %] 30 % Set Rate:  [18 bmp] 18 bmp Vt Set:  [480 mL] 480 mL PEEP:  [5 cmH20] 5 cmH20 Pressure Support:  [5 cmH20] 5 cmH20 Plateau Pressure:  [14 cmH20-16 cmH20] 15 cmH20   Intake/Output Summary (Last 24 hours) at 06/23/2018 9030 Last data filed at 06/23/2018 0800 Gross per 24 hour  Intake 2572.61 ml  Output 4875 ml  Net -2302.39 ml   Filed Weights   06/21/18 0500 06/22/18 0500 06/23/18 0500  Weight: 90.9 kg 89 kg 86.9 kg    Examination: General: adult female lying in bed in NAD HEENT: MM pink/moist, ETT Neuro: Awakens to voice, follows simple commands  CV: s1s2 rrr, no m/r/g PULM: even/non-labored, lungs bilaterally clear  SP:QZRA, non-tender, bsx4 active  Extremities: warm/dry, no edema  Skin: no rashes or lesions  Resolved Hospital Problem list      Assessment & Plan:   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 Baseline immune suppression. CSF with increased lymphocytes c/w meningitis.  P: Neurology following, appreciate input  Continue acyclovir  Stop ampicillin Likely will need 21 days acyclovir  ID consult placed 5/6  Follow LP studies   Acute Hypoxic Respiratory Failure -intubated 5/4 for inability to protect airway  Small Pleural Effusions P: PSV wean with plan for extubation  Follow intermittent CXR   Hyponatremia  Hypokalemia  -hypotonic (by serum osm), euvolemic on exam (urine osm 414). TSH, cortisol WNL.  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 Disposition: ICU   Labs   CBC: Recent Labs  Lab 07/15/2018 2048 06/19/18 0412 06/20/18 0510 06/21/18 0647 06/22/18 0521 06/23/18 0500  WBC 5.1 5.3 4.1 3.7* 3.3* 1.6*  NEUTROABS 3.3  --   --   --   --   --   HGB 11.0* 11.3* 10.5* 10.8* 9.9* 9.4*  HCT 31.9* 34.0* 32.1* 32.1* 29.0* 28.1*  MCV 95.8 96.3 97.9 97.3 97.3 96.9  PLT 232 233 202 208 203 762    Basic Metabolic Panel: Recent Labs  Lab 07/06/2018 2317 06/19/18 0412  06/21/18 0305 06/21/18 0647 06/21/18 1307 06/21/18 2100 06/22/18 0521 06/23/18 0500  NA  --  130*   < > 135 134* 135 134* 135 134*  K  --  3.4*   < > 4.0 3.2* 3.9 3.0* 3.6 2.9*  CL  --  98   < > 107 107 107 107 106 102  CO2  --  24   < > 17* 17* 21* 21* 23 25  GLUCOSE  --  102*   < > 87 84 95 125* 127* 148*  BUN  --  15   < > 12 10 10 12 19 20   CREATININE  --  0.62   < > 0.82 0.79 0.67 0.64 0.62 0.61  CALCIUM  --  7.7*   < > 7.8* 7.6* 7.4* 7.4* 7.7* 7.5*  MG 2.0 2.0  --  2.1  --   --   --  2.0 1.7  PHOS  --   --   --   --   --   --   --  2.0*  --    < > = values in this interval not displayed.   GFR: Estimated Creatinine Clearance: 80.6 mL/min (by C-G formula based on SCr of 0.61 mg/dL). Recent Labs  Lab 06/28/2018 2048 06/25/2018 2317  06/20/18 0510 06/20/18 1857 06/21/18 0647 06/22/18 0521 06/23/18 0500  PROCALCITON <0.10  --   --   --  0.44  --   --   --   WBC 5.1  --    < > 4.1  --  3.7* 3.3* 1.6*  LATICACIDVEN 1.5 0.7  --   --   --   --   --   --    < > = values in this interval not displayed.    Liver Function Tests: Recent Labs  Lab 07/12/2018 2048 06/21/18 0305  06/22/18 0521 06/23/18 0500  AST 25 43* 29 25  ALT 16 22 19 18   ALKPHOS 78 66 52 52  BILITOT 0.9 1.0 0.7 0.2*  PROT 6.8 5.9* 5.5* 5.2*  ALBUMIN  3.7 3.1* 2.7* 2.5*   No results for input(s): LIPASE, AMYLASE in the last 168 hours. Recent Labs  Lab 06/20/18 1732  AMMONIA 18    ABG    Component Value Date/Time   PHART 7.474 (H) 06/21/2018 1030   PCO2ART 27.0 (L) 06/21/2018 1030   PO2ART 105 06/21/2018 1030   HCO3 19.6 (L) 06/21/2018 1030   ACIDBASEDEF 2.8 (H) 06/21/2018 1030   O2SAT 98.3 06/21/2018 1030     Coagulation Profile: No results for input(s): INR, PROTIME in the last 168 hours.  Cardiac Enzymes: Recent Labs  Lab 06/19/18 1353 06/20/18 0510 06/20/18 1857 06/22/18 0521  CKTOTAL 331* 465* 602* 254*  TROPONINI  --   --  0.03*  --     HbA1C: Hgb A1c MFr Bld  Date/Time Value Ref Range Status  04/27/2017 03:51 PM 5.4 <5.7 % of total Hgb Final    Comment:    For the purpose of screening for the presence of diabetes: . <5.7%       Consistent with the absence of diabetes 5.7-6.4%    Consistent with increased risk for diabetes             (prediabetes) > or =6.5%  Consistent with diabetes . This assay result is consistent with a decreased risk of diabetes. . Currently, no consensus exists regarding use of hemoglobin A1c for diagnosis of diabetes in children. . According to American Diabetes Association (ADA) guidelines, hemoglobin A1c <7.0% represents optimal control in non-pregnant diabetic patients. Different metrics may apply to specific patient populations.  Standards of Medical Care in Diabetes(ADA). .     CBG: Recent Labs  Lab 06/22/18 0807 06/22/18 1237 06/22/18 1647 06/22/18 2319 06/23/18 0553  GLUCAP 118* 86 134* 122* 134*     Critical care time:  30 minutes     Noe Gens, NP-C [facility] Pgr: [phone] or if no answer [phone] 06/23/2018, 8:29 AM

## 2018-06-23 NOTE — Procedures (Signed)
Extubation Procedure Note  Patient Details:   Name: Nancy Parks DOB: 01-25-1951 MRN: [id number]   Airway Documentation:  Airway 7.5 mm (Active)  Secured at (cm) 25 cm 06/23/2018  8:00 AM  Measured From Lips 06/23/2018  8:00 AM  Secured Location Left 06/23/2018  7:39 AM  Secured By [employer] 06/23/2018  8:00 AM  Tube Holder Repositioned Yes 06/23/2018  7:39 AM  Cuff Pressure (cm H2O) 30 cm H2O 06/23/2018  7:39 AM  Site Condition Dry 06/23/2018  8:00 AM   Vent end date: (not recorded) Vent end time: (not recorded)   Evaluation  O2 sats: stable throughout Complications: No apparent complications Patient did tolerate procedure well. Bilateral Breath Sounds: Clear, Diminished   No  Johnette Abraham 06/23/2018, 10:31 AM

## 2018-06-23 NOTE — Progress Notes (Signed)
PT Cancellation Note  Patient Details Name: Nancy Parks MRN: [id number] DOB: January 10, 1951   Cancelled Treatment:    Reason Eval/Treat Not Completed: Order received. Chart reviewed. Pt extubated today. Will attempt PT eval on tomorrow. Thanks.    Weston Anna, PT Acute Rehabilitation Services Pager: [phone] Office: [phone]

## 2018-06-23 NOTE — Evaluation (Signed)
SLP Cancellation Note  Patient Details Name: Nancy Parks MRN: [id number] DOB: [birth year]   Cancelled treatment:       Reason Eval/Treat Not Completed: Other (comment);Fatigue/lethargy limiting ability to participate(pt not alert enough for swallow evaluation, will continue efforts)  Spoke to RN who is in agreement.   Luanna Salk, MS [facility] SLP Acute Rehab Services Pager [phone] Office [phone]   Macario Golds 06/23/2018, 4:59 PM

## 2018-06-24 ENCOUNTER — Inpatient Hospital Stay (HOSPITAL_COMMUNITY): Payer: Medicare HMO

## 2018-06-24 ENCOUNTER — Other Ambulatory Visit: Payer: Self-pay

## 2018-06-24 ENCOUNTER — Ambulatory Visit: Payer: Medicare HMO | Admitting: Psychology

## 2018-06-24 DIAGNOSIS — G0490 Encephalitis and encephalomyelitis, unspecified: Secondary | ICD-10-CM

## 2018-06-24 DIAGNOSIS — E871 Hypo-osmolality and hyponatremia: Secondary | ICD-10-CM

## 2018-06-24 DIAGNOSIS — R410 Disorientation, unspecified: Secondary | ICD-10-CM

## 2018-06-24 DIAGNOSIS — J9601 Acute respiratory failure with hypoxia: Secondary | ICD-10-CM

## 2018-06-24 LAB — PROTEIN ELECTROPHORESIS, SERUM
A/G Ratio: 1 (ref 0.7–1.7)
Albumin ELP: 2.7 g/dL — ABNORMAL LOW (ref 2.9–4.4)
Alpha-1-Globulin: 0.3 g/dL (ref 0.0–0.4)
Alpha-2-Globulin: 0.7 g/dL (ref 0.4–1.0)
Beta Globulin: 0.7 g/dL (ref 0.7–1.3)
Gamma Globulin: 0.8 g/dL (ref 0.4–1.8)
Globulin, Total: 2.6 g/dL (ref 2.2–3.9)
M-Spike, %: 0.6 g/dL — ABNORMAL HIGH
Total Protein ELP: 5.3 g/dL — ABNORMAL LOW (ref 6.0–8.5)

## 2018-06-24 LAB — CULTURE, BLOOD (ROUTINE X 2)
Culture: NO GROWTH
Culture: NO GROWTH
Special Requests: ADEQUATE

## 2018-06-24 LAB — IGG, IGA, IGM
IgA: 34 mg/dL — ABNORMAL LOW (ref 87–352)
IgG (Immunoglobin G), Serum: 1016 mg/dL (ref 586–1602)
IgM (Immunoglobulin M), Srm: 63 mg/dL (ref 26–217)

## 2018-06-24 LAB — CBC
HCT: 32 % — ABNORMAL LOW (ref 36.0–46.0)
Hemoglobin: 10.6 g/dL — ABNORMAL LOW (ref 12.0–15.0)
MCH: 32.1 pg (ref 26.0–34.0)
MCHC: 33.1 g/dL (ref 30.0–36.0)
MCV: 97 fL (ref 80.0–100.0)
Platelets: 240 10*3/uL (ref 150–400)
RBC: 3.3 MIL/uL — ABNORMAL LOW (ref 3.87–5.11)
RDW: 14.1 % (ref 11.5–15.5)
WBC: 4.5 10*3/uL (ref 4.0–10.5)
nRBC: 0 % (ref 0.0–0.2)

## 2018-06-24 LAB — COMPREHENSIVE METABOLIC PANEL
ALT: 24 U/L (ref 0–44)
AST: 33 U/L (ref 15–41)
Albumin: 3 g/dL — ABNORMAL LOW (ref 3.5–5.0)
Alkaline Phosphatase: 72 U/L (ref 38–126)
Anion gap: 9 (ref 5–15)
BUN: 18 mg/dL (ref 8–23)
CO2: 22 mmol/L (ref 22–32)
Calcium: 7.9 mg/dL — ABNORMAL LOW (ref 8.9–10.3)
Chloride: 103 mmol/L (ref 98–111)
Creatinine, Ser: 0.55 mg/dL (ref 0.44–1.00)
GFR calc Af Amer: >60 mL/min (ref >60)
GFR calc non Af Amer: >60 mL/min (ref >60)
Glucose, Bld: 131 mg/dL — ABNORMAL HIGH (ref 70–99)
Potassium: 3.1 mmol/L — ABNORMAL LOW (ref 3.5–5.1)
Sodium: 134 mmol/L — ABNORMAL LOW (ref 135–145)
Total Bilirubin: 0.6 mg/dL (ref 0.3–1.2)
Total Protein: 6.4 g/dL — ABNORMAL LOW (ref 6.5–8.1)

## 2018-06-24 LAB — CSF CULTURE W GRAM STAIN: Culture: NO GROWTH

## 2018-06-24 LAB — KAPPA/LAMBDA LIGHT CHAINS
Kappa free light chain: 7.4 mg/L (ref 3.3–19.4)
Kappa, lambda light chain ratio: 0.69 (ref 0.26–1.65)
Lambda free light chains: 10.7 mg/L (ref 5.7–26.3)

## 2018-06-24 LAB — GLUCOSE, CAPILLARY
Glucose-Capillary: 140 mg/dL — ABNORMAL HIGH (ref 70–99)
Glucose-Capillary: 133 mg/dL — ABNORMAL HIGH (ref 70–99)
Glucose-Capillary: 132 mg/dL — ABNORMAL HIGH (ref 70–99)

## 2018-06-24 LAB — HIV ANTIBODY (ROUTINE TESTING W REFLEX): HIV Screen 4th Generation wRfx: NONREACTIVE

## 2018-06-24 LAB — MAGNESIUM: Magnesium: 2 mg/dL (ref 1.7–2.4)

## 2018-06-24 LAB — C-REACTIVE PROTEIN: CRP: 6.7 mg/dL — ABNORMAL HIGH (ref <1.0)

## 2018-06-24 MED ORDER — FUROSEMIDE 10 MG/ML IJ SOLN
40.0000 mg | Freq: Once | INTRAMUSCULAR | Status: AC
  Administered 2018-06-24: 12:00:00 40 mg via INTRAVENOUS
  Filled 2018-06-24: qty 4

## 2018-06-24 MED ORDER — MUPIROCIN 2 % EX OINT
1.0000 "application " | TOPICAL_OINTMENT | Freq: Two times a day (BID) | CUTANEOUS | Status: DC
  Administered 2018-06-24 – 2018-06-27 (×8): 1 via NASAL
  Filled 2018-06-24: qty 22

## 2018-06-24 MED ORDER — POTASSIUM CHLORIDE 20 MEQ/15ML (10%) PO SOLN
40.0000 meq | Freq: Once | ORAL | Status: AC
  Administered 2018-06-24: 20:00:00 40 meq
  Filled 2018-06-24: qty 30

## 2018-06-24 NOTE — Evaluation (Addendum)
SLP Cancellation Note  Patient Details Name: Nancy Parks MRN: [id number] DOB: 05-29-1950   Cancelled treatment:       Reason Eval/Treat Not Completed: Other (comment);Fatigue/lethargy limiting ability to participate(pt is not alert enough for swallow evaluation; will follow up Monday 06/28/2018; please call if pt is appropriate for evaluation/po prior to Monday 06/28/2018)   Call pager [phone]   Macario Golds 06/24/2018, 1:55 PM   Luanna Salk, [facility] SLP [place] Pager [phone] Office [phone]

## 2018-06-24 NOTE — Progress Notes (Signed)
PT Cancellation Note  Patient Details Name: Nancy Parks MRN: [id number] DOB: 1950-10-21   Cancelled Treatment:     PT order received but eval deferred - RN advises pt very lethargic and unable to follow cues.  Will follow.   Dimitry Holsworth 06/24/2018, 1:17 PM

## 2018-06-24 NOTE — Progress Notes (Signed)
OT Cancellation Note  Patient Details Name: Nancy Parks MRN: [id number] DOB: June 14, 1950   Cancelled Treatment:    Reason Eval/Treat Not Completed: Fatigue/lethargy limiting ability to participate  Will check back next day  Kari Baars, [place] [phone] Office- [phone], Thereasa Parkin 06/24/2018, 1:14 PM

## 2018-06-24 NOTE — Progress Notes (Addendum)
NAME:  Nancy Parks, MRN:  [id number], DOB:  02-07-1951, LOS: 4 ADMISSION DATE:  06/23/2018, CONSULTATION DATE: 06/21/2018 REFERRING MD: Lonny Prude, CHIEF COMPLAINT: Altered mental status  Brief History   Patient is a 68 year old female with a history of multiple myeloma admitted with progressive worsening in neurologic status.  2 week hx of headaches with intermittent hallucinations.  Neurology following, differential of viral encephalitis. Pt remains intermittently febrile (to 103).   Past Medical History  Multiple myeloma Chronic leukopenia Depression Recent changes in neurologic status  Significant Hospital Events   5/01 Admit with AMS 5/03 Tx to ICU, intubated 5/06 Extubated  Consults:  Neurology  PCCM 5/4  Procedures:  ETT 5/4 >> 5/6 TLC 5/4 >>   Significant Diagnostic Tests:  MRI Head 4/27 PTA >> no acute findings , no change from 2019  CT head 5/1 >> no acute findings  CT Chest 5/2 >> neg PE, LLL 8 mm nodule US Neck 5/2 >>no acute process  CT ABD / Pelvis 5/3 >> mild to mod bilateral hydronephrosis, mod distended bladder, sm effusion, BB atx, tr free fluid in pelvis  LP 5/3 >> clear fluid, glucose 39, WBC 44, neutrophils 7, RBC 6, Lymph 70, total protein 126, other cells present  EEG 5/5 >> general background slowing  Micro Data:  5/03 BC x 2 >> 5/01 BC x 2 >> negative 5/03 LP CSF >> cell count 44, predom. 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  On RA. Tmax 99.7.  4L UOP in last 24 hours, net neg 1.8 for last 24.    Objective   Blood pressure (!) 158/70,  pulse 96, temperature 99.7 F (37.6 C), temperature source Bladder, resp. rate (!) 28, height 5' 10" (1.778 m), weight 84.8 kg, SpO2 95 %.        Intake/Output Summary (Last 24 hours) at 06/24/2018 1029 Last data filed at 06/24/2018 0700 Gross per 24 hour  Intake 1095.35 ml  Output 4050 ml  Net -2954.65 ml   Filed Weights   06/22/18 0500 06/23/18 0500 06/24/18 0500  Weight: 89 kg 86.9 kg 84.8 kg    Examination: General: ill appearing adult female lying in bed in NAD HEENT: MM pink/dry, no jvd Neuro: pupils 35m =/reactive, opens eyes to voice, looks at provider, follows simple commands, generalized weakness but moves all extremities  CV: s1s2 rrr, no m/r/g PULM: even/non-labored, lungs bilaterally clear GHL:KTGY non-tender, bsx4 active  Extremities: warm/dry, no edema  Skin: no rashes or lesions  Resolved Hospital Problem list      Assessment & Plan:   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 Baseline immune suppression. CSF with increased lymphocytes c/w meningitis.  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 TSH, cortisol WNL.  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 Disposition: SDU  Labs   CBC: Recent Labs  Lab 07/01/2018 2048  06/20/18 0510 06/21/18 0647 06/22/18 0521 06/23/18 0500 06/24/18 0441  WBC 5.1   < > 4.1 3.7* 3.3* 1.6* 4.5  NEUTROABS 3.3  --   --   --   --   --   --   HGB 11.0*   < > 10.5* 10.8* 9.9* 9.4* 10.6*  HCT 31.9*   < > 32.1* 32.1* 29.0* 28.1* 32.0*  MCV 95.8   < > 97.9 97.3 97.3 96.9 97.0  PLT 232   < > 202 208 203 186 240   < > = values in this interval not displayed.    Basic Metabolic Panel: Recent Labs  Lab 06/19/18 0412  06/21/18 0305  06/21/18 1307 06/21/18 2100 06/22/18 0521 06/23/18 0500 06/24/18 0441  NA 130*   < > 135   < > 135 134* 135 134* 134*  K 3.4*   < > 4.0   < > 3.9 3.0* 3.6 2.9* 3.1*  CL 98   < > 107   < > 107 107 106 102 103  CO2 24   < > 17*   < > 21* 21* 23 25 22  GLUCOSE 102*   < > 87   < > 95 125* 127* 148* 131*  BUN 15   < > 12   < > 10 12 19 20 18  CREATININE 0.62   < > 0.82   < > 0.67 0.64 0.62 0.61 0.55  CALCIUM 7.7*   < > 7.8*   < > 7.4* 7.4* 7.7* 7.5* 7.9*  MG 2.0  --  2.1  --   --   --  2.0 1.7 2.0  PHOS  --   --   --   --   --   --  2.0*  --   --    < > = values in this interval not displayed.   GFR: Estimated Creatinine Clearance: 79.7 mL/min (by C-G formula based on SCr of 0.55 mg/dL). Recent Labs  Lab 07/02/2018 2048 06/24/2018 2317  06/20/18 1857 06/21/18 0647 06/22/18 0521 06/23/18 0500 06/24/18 0441  PROCALCITON <0.10  --   --  0.44  --   --   --   --   WBC 5.1  --    < >  --  3.7* 3.3* 1.6* 4.5  LATICACIDVEN 1.5 0.7  --   --   --   --    --   --    < > = values in this interval not displayed.    Liver   Function Tests: Recent Labs  Lab 07/09/2018 2048 06/21/18 0305 06/22/18 0521 06/23/18 0500 06/24/18 0441  AST 25 43* 29 25 33  ALT _0 ALKPHOS 78 66 52 52 72  BILITOT 0.9 1.0 0.7 0.2* 0.6  PROT 6.8 5.9* 5.5* 5.2* 6.4*  ALBUMIN 3.7 3.1* 2.7* 2.5* 3.0*   No results for input(s): LIPASE, AMYLASE in the last 168 hours. Recent Labs  Lab 06/20/18 1732  AMMONIA 18    ABG    Component Value Date/Time   PHART 7.474 (H) 06/21/2018 1030   PCO2ART 27.0 (L) 06/21/2018 1030   PO2ART 105 06/21/2018 1030   HCO3 19.6 (L) 06/21/2018 1030   ACIDBASEDEF 2.8 (H) 06/21/2018 1030   O2SAT 98.3 06/21/2018 1030     Coagulation Profile: No results for input(s): INR, PROTIME in the last 168 hours.  Cardiac Enzymes: Recent Labs  Lab 06/19/18 1353 06/20/18 0510 06/20/18 1857 06/22/18 0521  CKTOTAL 331* 465* 602* 254*  TROPONINI  --   --  0.03*  --     HbA1C: Hgb A1c MFr Bld  Date/Time Value Ref Range Status  04/27/2017 03:51 PM 5.4 <5.7 % of total Hgb Final    Comment:    For the purpose of screening for the presence of diabetes: . <5.7%       Consistent with the absence of diabetes 5.7-6.4%    Consistent with increased risk for diabetes             (prediabetes) > or =6.5%  Consistent with diabetes . This assay result is consistent with a decreased risk of diabetes. . Currently, no consensus exists regarding use of hemoglobin A1c for diagnosis of diabetes in children. . According to American Diabetes Association (ADA) guidelines, hemoglobin A1c <7.0% represents optimal control in non-pregnant diabetic patients. Different metrics may apply to specific patient populations.  Standards of Medical Care in Diabetes(ADA). .     CBG: Recent Labs  Lab 06/23/18 0553 06/23/18 1211 06/23/18 1722 06/23/18 2328 06/24/18 0549  GLUCAP 134* 96 118* 111* 140*     Critical care time:      Noe Gens, NP-C [place] [facility] Pgr: [phone] or if no answer [phone] 06/24/2018, 10:29 AM   Attending: Pt seen/ examined / xrays and labs reviewed as above and updates / corrections completed  She has improved but has a long way to go neurologically with high risk asp/ recurrent resp failure we will keep her on the ICU service for now and continue to monitor closely.Christinia Gully, MD Pulmonary and [place] [phone] After 5:30 PM or weekends, use [facility] [phone]

## 2018-06-25 ENCOUNTER — Inpatient Hospital Stay (HOSPITAL_COMMUNITY): Payer: Medicare HMO

## 2018-06-25 LAB — OLIGOCLONAL BANDS, CSF + SERM

## 2018-06-25 LAB — GLUCOSE, CAPILLARY
Glucose-Capillary: 126 mg/dL — ABNORMAL HIGH (ref 70–99)
Glucose-Capillary: 111 mg/dL — ABNORMAL HIGH (ref 70–99)
Glucose-Capillary: 133 mg/dL — ABNORMAL HIGH (ref 70–99)
Glucose-Capillary: 166 mg/dL — ABNORMAL HIGH (ref 70–99)
Glucose-Capillary: 136 mg/dL — ABNORMAL HIGH (ref 70–99)

## 2018-06-25 LAB — CBC WITH DIFFERENTIAL/PLATELET
Abs Immature Granulocytes: 0.67 10*3/uL — ABNORMAL HIGH (ref 0.00–0.07)
Basophils Absolute: 0.1 10*3/uL (ref 0.0–0.1)
Basophils Relative: 1 %
Eosinophils Absolute: 2.2 10*3/uL — ABNORMAL HIGH (ref 0.0–0.5)
Eosinophils Relative: 37 %
HCT: 32.7 % — ABNORMAL LOW (ref 36.0–46.0)
Hemoglobin: 11.1 g/dL — ABNORMAL LOW (ref 12.0–15.0)
Immature Granulocytes: 11 %
Lymphocytes Relative: 10 %
Lymphs Abs: 0.6 10*3/uL — ABNORMAL LOW (ref 0.7–4.0)
MCH: 32.7 pg (ref 26.0–34.0)
MCHC: 33.9 g/dL (ref 30.0–36.0)
MCV: 96.5 fL (ref 80.0–100.0)
Monocytes Absolute: 0.2 10*3/uL (ref 0.1–1.0)
Monocytes Relative: 3 %
Neutro Abs: 2.2 10*3/uL (ref 1.7–7.7)
Neutrophils Relative %: 38 %
Platelets: 298 10*3/uL (ref 150–400)
RBC: 3.39 MIL/uL — ABNORMAL LOW (ref 3.87–5.11)
RDW: 14.6 % (ref 11.5–15.5)
WBC: 6 10*3/uL (ref 4.0–10.5)
nRBC: 0 % (ref 0.0–0.2)

## 2018-06-25 LAB — COMPREHENSIVE METABOLIC PANEL
ALT: 32 U/L (ref 0–44)
AST: 36 U/L (ref 15–41)
Albumin: 3.1 g/dL — ABNORMAL LOW (ref 3.5–5.0)
Alkaline Phosphatase: 95 U/L (ref 38–126)
Anion gap: 10 (ref 5–15)
BUN: 26 mg/dL — ABNORMAL HIGH (ref 8–23)
CO2: 23 mmol/L (ref 22–32)
Calcium: 8.3 mg/dL — ABNORMAL LOW (ref 8.9–10.3)
Chloride: 101 mmol/L (ref 98–111)
Creatinine, Ser: 0.56 mg/dL (ref 0.44–1.00)
GFR calc Af Amer: >60 mL/min (ref >60)
GFR calc non Af Amer: >60 mL/min (ref >60)
Glucose, Bld: 129 mg/dL — ABNORMAL HIGH (ref 70–99)
Potassium: 3.4 mmol/L — ABNORMAL LOW (ref 3.5–5.1)
Sodium: 134 mmol/L — ABNORMAL LOW (ref 135–145)
Total Bilirubin: 0.4 mg/dL (ref 0.3–1.2)
Total Protein: 6.6 g/dL (ref 6.5–8.1)

## 2018-06-25 LAB — HEPATITIS C ANTIBODY (REFLEX): HCV Ab: 0.2 s/co ratio (ref 0.0–0.9)

## 2018-06-25 LAB — C-REACTIVE PROTEIN: CRP: 6.8 mg/dL — ABNORMAL HIGH (ref <1.0)

## 2018-06-25 LAB — MAGNESIUM: Magnesium: 2.2 mg/dL (ref 1.7–2.4)

## 2018-06-25 LAB — HCV COMMENT:

## 2018-06-25 MED ORDER — ORAL CARE MOUTH RINSE
15.0000 mL | Freq: Two times a day (BID) | OROMUCOSAL | Status: DC
  Administered 2018-06-25 – 2018-06-27 (×5): 15 mL via OROMUCOSAL

## 2018-06-25 MED ORDER — CHOLECALCIFEROL 10 MCG (400 UNIT) PO TABS
400.0000 [IU] | ORAL_TABLET | Freq: Every day | ORAL | Status: DC
  Administered 2018-06-25 – 2018-06-27 (×3): 400 [IU]
  Filled 2018-06-25 (×3): qty 1

## 2018-06-25 MED ORDER — CHLORHEXIDINE GLUCONATE 0.12 % MT SOLN
15.0000 mL | Freq: Two times a day (BID) | OROMUCOSAL | Status: DC
  Administered 2018-06-25 – 2018-06-27 (×6): 15 mL via OROMUCOSAL
  Filled 2018-06-25 (×4): qty 15

## 2018-06-25 MED ORDER — POTASSIUM CHLORIDE 20 MEQ/15ML (10%) PO SOLN
20.0000 meq | ORAL | Status: AC
  Administered 2018-06-25 (×2): 20 meq
  Filled 2018-06-25 (×2): qty 15

## 2018-06-25 NOTE — Progress Notes (Signed)
[facility] ADULT ICU REPLACEMENT PROTOCOL FOR AM LAB REPLACEMENT ONLY  The patient does apply for the [facility] Adult ICU Electrolyte Replacment Protocol based on the criteria listed below:   1. Is GFR >/= 40 ml/min? Yes.    Patient's GFR today is >60 2. Is urine output >/= 0.5 ml/kg/hr for the last 6 hours? Yes.   Patient's UOP is .8 ml/kg/hr 3. Is BUN < 60 mg/dL? Yes.    Patient's BUN today is 26 4. Abnormal electrolyte(s): K-3.4 5. Ordered repletion with: per protocol 6. If a panic level lab has been reported, has the CCM MD in charge been notified? Yes.  .   Physician:  Dr. Jonetta Speak, Philis Nettle 06/25/2018 6:18 AM

## 2018-06-25 NOTE — Evaluation (Signed)
Physical Therapy Evaluation Patient Details Name: Ziara Thelander MRN: [id number] DOB: [birth year] Today's Date: 06/25/2018   History of Present Illness  68 year old female admitted for febrile illness and AMS. 2 week h/o headaches and hallucinations.  PMH:  lambda smoldering myeloma, depression, anxiety and migraines  Clinical Impression  Pt admitted as above and presenting with functional mobility limitations 2* generalized weakness, balance deficits and current level of cognition and ability to participate.  Pt would benefit from follow up rehab at SNF level dependent on acute stay progress.    Follow Up Recommendations SNF    Equipment Recommendations  None recommended by PT    Recommendations for Other Services       Precautions / Restrictions Precautions Precautions: Fall Restrictions Weight Bearing Restrictions: No      Mobility  Bed Mobility Overal bed mobility: Needs Assistance Bed Mobility: Supine to Sit;Sit to Supine     Supine to sit: Total assist;+2 for physical assistance Sit to supine: Total assist;+2 for physical assistance   General bed mobility comments: total +2 assistance for bed mobility  Transfers                 General transfer comment: NT 2* safety based on pt's limited ability to participate and follow cues  Ambulation/Gait                Stairs            Wheelchair Mobility    Modified Rankin (Stroke Patients Only)       Balance Overall balance assessment: Needs assistance     Sitting balance - Comments: tends to lose balance/lean to L.  Min guard to min A for balance for most of the time. She did prop on LUE once and held herself there                                     Pertinent Vitals/Pain Pain Assessment: Faces Faces Pain Scale: No hurt    Home Living Family/patient expects to be discharged to:: Unsure Living Arrangements: Other relatives               Additional Comments: per RN,  lived with roommate.    Prior Function           Comments: Unsure if any assistance needed:  pt unable to state     Hand Dominance   Dominant Hand: Right    Extremity/Trunk Assessment   Upper Extremity Assessment Upper Extremity Assessment: Defer to OT evaluation RUE Deficits / Details: difficult to assess due to cognition.  Bil UEs feel heavy. AAROM performed bil.  Moves R hand LUE Deficits / Details: L hand semi flexed; pt did not initiate movement, ? if she is able    Lower Extremity Assessment Lower Extremity Assessment: Generalized weakness;Difficult to assess due to impaired cognition       Communication   Communication: (Pt non-verbal at this time)  Cognition Arousal/Alertness: Awake/alert Behavior During Therapy: Flat affect Overall Cognitive Status: Impaired/Different from baseline                                 General Comments: presented with AMS, did not follow most commands; did assist correcting posture, without cues at times, and cues may have coincided with what she was going to do  General Comments      Exercises     Assessment/Plan    PT Assessment Patient needs continued PT services  PT Problem List Decreased strength;Decreased range of motion;Decreased activity tolerance;Decreased balance;Decreased mobility;Decreased cognition;Decreased knowledge of use of DME;Obesity       PT Treatment Interventions DME instruction;Gait training;Functional mobility training;Therapeutic activities;Therapeutic exercise;Balance training;Patient/family education    PT Goals (Current goals can be found in the Care Plan section)  Acute Rehab PT Goals Patient Stated Goal: unable to state PT Goal Formulation: Patient unable to participate in goal setting Time For Goal Achievement: 07/09/18 Potential to Achieve Goals: Fair    Frequency Min 3X/week   Barriers to discharge        Co-evaluation PT/OT/SLP Co-Evaluation/Treatment:  Yes Reason for Co-Treatment: For patient/therapist safety PT goals addressed during session: Mobility/safety with mobility OT goals addressed during session: Strengthening/ROM       AM-PAC PT "6 Clicks" Mobility  Outcome Measure Help needed turning from your back to your side while in a flat bed without using bedrails?: Total Help needed moving from lying on your back to sitting on the side of a flat bed without using bedrails?: Total Help needed moving to and from a bed to a chair (including a wheelchair)?: Total Help needed standing up from a chair using your arms (e.g., wheelchair or bedside chair)?: Total Help needed to walk in hospital room?: Total Help needed climbing 3-5 steps with a railing? : Total 6 Click Score: 6    End of Session   Activity Tolerance: Patient tolerated treatment well;Patient limited by fatigue Patient left: in bed;with call bell/phone within reach;with bed alarm set Nurse Communication: Mobility status PT Visit Diagnosis: Muscle weakness (generalized) (M62.81);Difficulty in walking, not elsewhere classified (R26.2)    Time: 3833-3832 PT Time Calculation (min) (ACUTE ONLY): 26 min   Charges:   PT Evaluation $PT Eval Low Complexity: 1 Low          Debe Coder PT Acute Rehabilitation Services Pager [phone] Office [phone]   Cruz Devilla 06/25/2018, 12:44 PM

## 2018-06-25 NOTE — Progress Notes (Addendum)
NAME:  Nancy Parks, MRN:  [id number], DOB:  [birth year], LOS: 5 ADMISSION DATE:  07/03/2018, CONSULTATION DATE: 06/21/2018 REFERRING MD: Lonny Prude, CHIEF COMPLAINT: Altered mental status  Brief History   68 year old female with a history of multiple myeloma admitted with progressive worsening in neurologic status.  2 week hx of headaches with intermittent hallucinations.  Neurology following, differential of viral encephalitis. Pt remains intermittently febrile (to 103).   Past Medical History  Multiple myeloma Chronic leukopenia Depression Recent changes in neurologic status  Significant Hospital Events   5/01 Admit with AMS 5/03 Tx to ICU, intubated 5/06 Extubated 5/08 On RA, more alert on am exam  Consults:  Neurology  PCCM 5/4  Procedures:  ETT 5/4 >> 5/6 TLC 5/4 >>   Significant Diagnostic Tests:  MRI Head 4/27 PTA >> no acute findings , no change from 2019  CT head 5/1 >> no acute findings  CT Chest 5/2 >> neg PE, LLL 8 mm nodule US Neck 5/2 >>no acute process  CT ABD / Pelvis 5/3 >> mild to mod bilateral hydronephrosis, mod distended bladder, sm effusion, BB atx, tr free fluid in pelvis  LP 5/3 >> clear fluid, glucose 39, WBC 44, neutrophils 7, RBC 6, Lymph 70, total protein 126, other cells present  EEG 5/5 >> general background slowing  Micro Data:  5/03 BC x 2 >> 5/01 BC x 2 >> negative 5/03 LP CSF >> cell count 44, predom. Lymphocytes , elevated protein 126 5/03 CSF culture >> negative  5/01 COVID >> NEGATIVE  5/03 COVID >> NEGATIVE  5/04 Strep Pneu Ag >> NEG  5/04 Legionella >>  5/03 Viral panel >> NEG  5/03 COVID (send out ) >> negative 5/03 HSV PCR >> HSV 1 POSITIVE 5/03 RPR >> negative 5/07 HIV >> negative   Antimicrobials:  Acyclovir 5/2 (empiric viral encephalitis) >>  Ampicillin 5/3 (empiric listeria) >> 5/6  Interim history / subjective:  No acute events overnight. K replaced per Warren Lacy. Tmax 99.5. I/O - 3.1L UOP in last 24 hours / net neg 1.6L in  last 24h.  Objective   Blood pressure (!) 170/74, pulse 99, temperature 99.5 F (37.5 C), temperature source Bladder, resp. rate (!) 26, height 5' 10"  (1.778 m), weight 82 kg, SpO2 96 %.        Intake/Output Summary (Last 24 hours) at 06/25/2018 0919 Last data filed at 06/25/2018 0630 Gross per 24 hour  Intake 1496.67 ml  Output 3100 ml  Net -1603.33 ml   Filed Weights   06/23/18 0500 06/24/18 0500 06/25/18 0456  Weight: 86.9 kg 84.8 kg 82 kg    Examination: General: ill appearing female lying in bed in NAD HEENT: MM pink/moist, small bore NG feeding tube in place Neuro: opens eyes to voice, more alert, nods / follows commands  CV: s1s2 rrr, no m/r/g PULM: even/non-labored, lungs bilaterally with rhonchi / upper airway noise, pt able to cough on command but weak  VO:ZDGU, non-tender, bsx4 active  Extremities: warm/dry, trace edema  Skin: no rashes or lesions  Resolved Hospital Problem list      Assessment & Plan:   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 Baseline immune suppression. CSF with increased lymphocytes c/w meningitis.  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 TSH, cortisol WNL.  P: Trend BMP / urinary output Replace electrolytes as indicated Avoid nephrotoxic agents, ensure adequate renal perfusion  Myeloma/chronic leukopenia   -baseline immunocompromised state -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, 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  P: Monitor / supportive care  ONC  following, appreciate input   At Risk Malnutrition  P: TF per Nutrition     Best practice:  Diet: NPO / TF Pain/Anxiety/Delirium protocol (if indicated):  Discontinue sedation   VAP protocol (if indicated): ordered  DVT prophylaxis: SCDs GI prophylaxis: PPI  Glucose control: Currently not indicated Mobility: Bedrest Code Status: No CPR but intubation Family Communication: Called sister Baker Janus) 5/7 and updated on plan of care.  Asked that I call roommate [name] for update 5/8 and reviewed plan of care. Have asked family to discuss scenario of re-intubation in the event she were to require it.   Disposition: SDU  Labs   CBC: Recent Labs  Lab 07/07/2018 2048  06/21/18 0647 06/22/18 0521 06/23/18 0500 06/24/18 0441 06/25/18 0513  WBC 5.1   < > 3.7* 3.3* 1.6* 4.5 6.0  NEUTROABS 3.3  --   --   --   --   --  2.2  HGB 11.0*   < > 10.8* 9.9* 9.4* 10.6* 11.1*  HCT 31.9*   < > 32.1* 29.0* 28.1* 32.0* 32.7*  MCV 95.8   < > 97.3 97.3 96.9 97.0 96.5  PLT 232   < > 208 203 186 240 298   < > = values in this interval not displayed.    Basic Metabolic Panel: Recent Labs  Lab 06/21/18 0305  06/21/18 2100 06/22/18 0521 06/23/18 0500 06/24/18 0441 06/25/18 0513  NA 135   < > 134* 135 134* 134* 134*  K 4.0   < > 3.0* 3.6 2.9* 3.1* 3.4*  CL 107   < > 107 106 102 103 101  CO2 17*   < > 21* 23 25 22 23   GLUCOSE 87   < > 125* 127* 148* 131* 129*  BUN 12   < > 12 19 20 18  26*  CREATININE 0.82   < > 0.64 0.62 0.61 0.55 0.56  CALCIUM 7.8*   < > 7.4* 7.7* 7.5* 7.9* 8.3*  MG 2.1  --   --  2.0 1.7 2.0 2.2  PHOS  --   --   --  2.0*  --   --   --    < > = values in this interval not displayed.   GFR: Estimated Creatinine Clearance: 72.8 mL/min (by C-G formula based on SCr of 0.56 mg/dL). Recent Labs  Lab 07/06/2018 2048 07/16/2018 2317  06/20/18 1857  06/22/18 0521 06/23/18 0500 06/24/18 0441 06/25/18 0513  PROCALCITON <0.10  --   --  0.44  --   --   --   --   --   WBC 5.1  --    < >   --    < > 3.3* 1.6* 4.5 6.0  LATICACIDVEN 1.5 0.7  --   --   --   --   --   --   --    < > = values in this interval not displayed.    Liver Function Tests: Recent Labs  Lab 06/21/18 0305  06/22/18 0521 06/23/18 0500 06/24/18 0441 06/25/18 0513  AST 43* 29 25 33 36  ALT 22 19 18 24  32  ALKPHOS 66 52 52 72 95  BILITOT 1.0 0.7 0.2* 0.6 0.4  PROT 5.9* 5.5* 5.2* 6.4* 6.6  ALBUMIN 3.1* 2.7* 2.5* 3.0* 3.1*   No results for input(s): LIPASE, AMYLASE in the last 168 hours. Recent Labs  Lab 06/20/18 1732  AMMONIA 18    ABG    Component Value Date/Time   PHART 7.474 (H) 06/21/2018 1030   PCO2ART 27.0 (L) 06/21/2018 1030   PO2ART 105 06/21/2018 1030   HCO3 19.6 (L) 06/21/2018 1030   ACIDBASEDEF 2.8 (H) 06/21/2018 1030   O2SAT 98.3 06/21/2018 1030     Coagulation Profile: No results for input(s): INR, PROTIME in the last 168 hours.  Cardiac Enzymes: Recent Labs  Lab 06/19/18 1353 06/20/18 0510 06/20/18 1857 06/22/18 0521  CKTOTAL 331* 465* 602* 254*  TROPONINI  --   --  0.03*  --     HbA1C: Hgb A1c MFr Bld  Date/Time Value Ref Range Status  04/27/2017 03:51 PM 5.4 <5.7 % of total Hgb Final    Comment:    For the purpose of screening for the presence of diabetes: . <5.7%       Consistent with the absence of diabetes 5.7-6.4%    Consistent with increased risk for diabetes             (prediabetes) > or =6.5%  Consistent with diabetes . This assay result is consistent with a decreased risk of diabetes. . Currently, no consensus exists regarding use of hemoglobin A1c for diagnosis of diabetes in children. . According to American Diabetes Association (ADA) guidelines, hemoglobin A1c <7.0% represents optimal control in non-pregnant diabetic patients. Different metrics may apply to specific patient populations.  Standards of Medical Care in Diabetes(ADA). .     CBG: Recent Labs  Lab 06/24/18 0549 06/24/18 1152 06/24/18 1724 06/25/18 0213 06/25/18  0602  GLUCAP 140* 133* 132* 126* 111*     Critical care time:  n/a    Noe Gens, NP-C [facility] Pgr: [phone] or if no answer [phone] 06/25/2018, 9:19 AM

## 2018-06-25 NOTE — Evaluation (Signed)
Occupational Therapy Evaluation Patient Details Name: Nancy Parks MRN: [id number] DOB: 1950/04/29 Today's Date: 06/25/2018    History of Present Illness 68 year old female admitted for febrile illness and AMS. 2 week h/o headaches and hallucinations.  PMH:  lambda smoldering myeloma, depression, anxiety and migraines   Clinical Impression   Pt was admitted for the above.  Pt was alert once she sat up but non verbal and didn't follow commands. Tolerated sitting well with min guard to min A. She needs total A for adls (+2 for LB/toileting) bed level. She is here with AMS--unsure of PLOF. Will follow in acute setting with the goals listed below    Follow Up Recommendations  SNF    Equipment Recommendations  None recommended by OT    Recommendations for Other Services       Precautions / Restrictions Precautions Precautions: Fall Restrictions Weight Bearing Restrictions: No      Mobility Bed Mobility               General bed mobility comments: total +2 assistance for bed mobility  Transfers                      Balance Overall balance assessment: Needs assistance     Sitting balance - Comments: tends to lose balance/lean to L.  Min guard to min A for balance for most of the time. She did prop on LUE once and held herself there                                   ADL either performed or assessed with clinical judgement   ADL Overall ADL's : Needs assistance/impaired                                       General ADL Comments: total A for all ADLS; +2 bed level for LB/toileting     Vision   Additional Comments: pt moves eyes; does not move head much--central line in place     Perception     Praxis      Pertinent Vitals/Pain Pain Assessment: Faces Faces Pain Scale: No hurt     Hand Dominance Right   Extremity/Trunk Assessment Upper Extremity Assessment Upper Extremity Assessment: RUE deficits/detail;LUE  deficits/detail RUE Deficits / Details: difficult to assess due to cognition.  Bil UEs feel heavy. AAROM performed bil.  Moves R hand LUE Deficits / Details: L hand semi flexed; pt did not initiate movement, ? if she is able           Communication Communication Communication: (non verbal, ? followed any commands vs spontaneous movement)   Cognition Arousal/Alertness: Awake/alert(when assisted to sitting) Behavior During Therapy: Flat affect Overall Cognitive Status: Impaired/Different from baseline                                 General Comments: presented with AMS, did not follow most commands; did assist correcting posture, without cues at times, and cues may have coincided with what she was going to do   General Comments       Exercises     Shoulder Instructions      Home Living Family/patient expects to be discharged to:: Unsure  Additional Comments: per RN, lived with roommate.      Prior Functioning/Environment          Comments: Unsure if any assistance needed:  pt unable to state        OT Problem List: Decreased strength;Decreased range of motion;Decreased activity tolerance;Impaired balance (sitting and/or standing);Decreased cognition;Impaired UE functional use      OT Treatment/Interventions: Self-care/ADL training;Therapeutic exercise;DME and/or AE instruction;Therapeutic activities;Cognitive remediation/compensation;Patient/family education;Balance training    OT Goals(Current goals can be found in the care plan section) Acute Rehab OT Goals Patient Stated Goal: unable to state OT Goal Formulation: Patient unable to participate in goal setting Time For Goal Achievement: 07/09/18 Potential to Achieve Goals: Fair ADL Goals Pt Will Perform Grooming: bed level;sitting;with mod assist Pt Will Perform Upper Body Bathing: with mod assist;bed level;sitting Pt Will Perform Upper Body Dressing:  with mod assist;sitting;bed level Additional ADL Goal #1: pt will sit unsupported with min guard x 5 minutes in preparation for seated adls Additional ADL Goal #2: Pt will perform bed mobility with mod A Additional ADL Goal #3: Pt will go from sit to stand with mod +2 for adls  OT Frequency: Min 2X/week   Barriers to D/C:            Co-evaluation PT/OT/SLP Co-Evaluation/Treatment: Yes Reason for Co-Treatment: For patient/therapist safety PT goals addressed during session: Mobility/safety with mobility;Balance OT goals addressed during session: Strengthening/ROM      AM-PAC OT "6 Clicks" Daily Activity     Outcome Measure Help from another person eating meals?: Total Help from another person taking care of personal grooming?: Total Help from another person toileting, which includes using toliet, bedpan, or urinal?: Total Help from another person bathing (including washing, rinsing, drying)?: Total Help from another person to put on and taking off regular upper body clothing?: Total Help from another person to put on and taking off regular lower body clothing?: Total 6 Click Score: 6   End of Session Nurse Communication: (will need lift for OOB)  Activity Tolerance: Patient tolerated treatment well Patient left: in bed;with call bell/phone within reach;with bed alarm set  OT Visit Diagnosis: Muscle weakness (generalized) (M62.81);Cognitive communication deficit (R41.841)                Time: 0539-7673 OT Time Calculation (min): 26 min Charges:  OT General Charges $OT Visit: 1 Visit OT Evaluation $OT Eval Low Complexity: [place], OTR/L Acute Rehabilitation Services [phone] [facility] pager [phone] office 06/25/2018  [place] 06/25/2018, 10:32 AM

## 2018-06-26 ENCOUNTER — Inpatient Hospital Stay (HOSPITAL_COMMUNITY): Payer: Medicare HMO

## 2018-06-26 DIAGNOSIS — J90 Pleural effusion, not elsewhere classified: Secondary | ICD-10-CM

## 2018-06-26 DIAGNOSIS — J9811 Atelectasis: Secondary | ICD-10-CM

## 2018-06-26 DIAGNOSIS — A86 Unspecified viral encephalitis: Secondary | ICD-10-CM

## 2018-06-26 DIAGNOSIS — R401 Stupor: Secondary | ICD-10-CM

## 2018-06-26 LAB — GLUCOSE, CAPILLARY
Glucose-Capillary: 120 mg/dL — ABNORMAL HIGH (ref 70–99)
Glucose-Capillary: 129 mg/dL — ABNORMAL HIGH (ref 70–99)
Glucose-Capillary: 139 mg/dL — ABNORMAL HIGH (ref 70–99)

## 2018-06-26 LAB — CBC WITH DIFFERENTIAL/PLATELET
Abs Immature Granulocytes: 0.01 10*3/uL (ref 0.00–0.07)
Basophils Absolute: 0.1 10*3/uL (ref 0.0–0.1)
Basophils Relative: 1 %
Eosinophils Absolute: 0 10*3/uL (ref 0.0–0.5)
Eosinophils Relative: 0 %
HCT: 32 % — ABNORMAL LOW (ref 36.0–46.0)
Hemoglobin: 10.4 g/dL — ABNORMAL LOW (ref 12.0–15.0)
Immature Granulocytes: 0 %
Lymphocytes Relative: 11 %
Lymphs Abs: 0.5 10*3/uL — ABNORMAL LOW (ref 0.7–4.0)
MCH: 31.9 pg (ref 26.0–34.0)
MCHC: 32.5 g/dL (ref 30.0–36.0)
MCV: 98.2 fL (ref 80.0–100.0)
Monocytes Absolute: 0.1 10*3/uL (ref 0.1–1.0)
Monocytes Relative: 2 %
Neutro Abs: 4.2 10*3/uL (ref 1.7–7.7)
Neutrophils Relative %: 86 %
Platelets: 333 10*3/uL (ref 150–400)
RBC: 3.26 MIL/uL — ABNORMAL LOW (ref 3.87–5.11)
RDW: 14.8 % (ref 11.5–15.5)
WBC: 4.9 10*3/uL (ref 4.0–10.5)
nRBC: 0 % (ref 0.0–0.2)

## 2018-06-26 LAB — BASIC METABOLIC PANEL
Anion gap: 10 (ref 5–15)
BUN: 42 mg/dL — ABNORMAL HIGH (ref 8–23)
CO2: 23 mmol/L (ref 22–32)
Calcium: 8.5 mg/dL — ABNORMAL LOW (ref 8.9–10.3)
Chloride: 101 mmol/L (ref 98–111)
Creatinine, Ser: 1.06 mg/dL — ABNORMAL HIGH (ref 0.44–1.00)
GFR calc Af Amer: >60 mL/min (ref >60)
GFR calc non Af Amer: 54 mL/min — ABNORMAL LOW (ref >60)
Glucose, Bld: 138 mg/dL — ABNORMAL HIGH (ref 70–99)
Potassium: 3.7 mmol/L (ref 3.5–5.1)
Sodium: 134 mmol/L — ABNORMAL LOW (ref 135–145)

## 2018-06-26 MED ORDER — LORAZEPAM 2 MG/ML IJ SOLN
1.0000 mg | INTRAMUSCULAR | Status: DC | PRN
  Administered 2018-06-26 – 2018-06-27 (×7): 1 mg via INTRAVENOUS
  Filled 2018-06-26 (×7): qty 1

## 2018-06-26 MED ORDER — SCOPOLAMINE 1 MG/3DAYS TD PT72
1.0000 | MEDICATED_PATCH | TRANSDERMAL | Status: DC
  Filled 2018-06-26: qty 1

## 2018-06-26 NOTE — Progress Notes (Addendum)
Pharmacy Antibiotic Note  Nancy Parks is a 68 y.o. female presented to the ED on 06/23/2018 with c/o fever, headache and AMS. She recently started on Augmentin PTA for suspected sinus tract infection. COVID test completed on admission was negative. Neurologist consulted and differential includes viral encephalitis vs meningitis vs Serotonin syndrome. Plan for LP by IR on 5/3 and to start broad abx with vancomycin, ceftriaxone, ampicillin and acyclovir for r/o meningitis. 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 Day #821 acyclovir  WBC up to 4.9 after Granix given 5/6 and 5/7, ANC 4.2 SCr doubled 0.56>>1.06 CRP 6.8 on 5/8 Afebrile  Plan:  Continue acyclovir 890 mg q8h (10 mgkg q8h) for HSV encephalitis  F/u WBC, renal function  Daily SCr  _______________________________________  Height: 5\' 10"  (177.8 cm) Weight: 192 lb 14.4 oz (87.5 kg) IBW/kg (Calculated) : 68.5  Temp (24hrs), Avg:99.1 F (37.3 C), Min:98.1 F (36.7 C), Max:100.2 F (37.9 C)  Recent Labs  Lab 06/22/18 0521 06/23/18 0500 06/24/18 0441 06/25/18 0513 06/26/18 0458  WBC 3.3* 1.6* 4.5 6.0 4.9  CREATININE 0.62 0.61 0.55 0.56 1.06*    Estimated Creatinine Clearance: 61 mL/min (A) (by C-G formula based on SCr of 1.06 mg/dL (H)).    Allergies  Allergen Reactions  . Latex Rash    REACTION: Rash  REACTION: Rash  . Other Rash  . Tape Rash  . Fosamax [Alendronate Sodium] Other (See Comments)    GI upset    Antimicrobials this admission:  5/2 vanc>> 5/3 5/2 CTX>> 5/3 Cefepime >> 5/3 5/2 ampicillin>>5/6 5/2 acyclovir>>  Dose adjustments this admission:   Microbiology results:  5/1 BCx x2: ngF 5/1 covid: negative 5/3 covid: negative 5/3 Covid sent out: negataive 5/2 HIV antb: NR 5/3 Flu A/B neg 5/3 RPR NR 5/3 HSV DNA PCR CSF> HSV 1 DNA positive HSV 2 DNA negative 5/4 strep pneumo Ug negative  5/3 RVP neg 5/3 BCx2>>ngtd 5/3 CSF>>NGF 5/4 legionella>> 5/5  MRSA PCR positive   Thank you for allowing pharmacy to be a part of this patient's care.  Eudelia Bunch, Pharm.D 06/26/2018 11:14 AM

## 2018-06-26 NOTE — Progress Notes (Addendum)
Pt regressing with little to offer and No code blue status / still saturating well on minimal nasal 02    Would not electively intubate in this setting.  Concerned nearing  futility criteria as this would only would be 'bridge" to long term vent/ trach in an immuocompromised pt with recurrent HCAP inevitable.  Can try overnight bipap since wob is the issue though does not address the risk of aspiration and if family is committed to longterm vent then ET can be considered as last option.  Discussed with nursing   Christinia Gully, MD Pulmonary and [place] [phone] After 5:30 PM or weekends, use [facility] [phone]

## 2018-06-26 NOTE — Progress Notes (Signed)
Patient experiencing increased WOB, use of accessory muscles RR sustaining in the 40's oxygen sustaining in the 90's.  S/p  Ativan and suctioning with no relief.  RN updated Dr. Melvyn Novas on patients condition, order for scopalomine patch placed.  RN will continue to monitor.

## 2018-06-26 NOTE — Progress Notes (Signed)
Nancy Parks is extubated.  She is still not responsive.  She does move around.  She is quite tachypneic.  Her blood pressure is okay.  We will try little Ativan to see if this helps slow down her breathing a little bit.  Her white cell count is 4.9.  Her absolute neutrophil count is 4.2.  I think this is pretty good with respect to her immunity.  She is on acyclovir.  She has this viral encephalitis.  Hopefully, she will be able to pull out of this.  She is on tube feeds.  She is doing okay with the tube feeds.  She had a chest x-ray last night.  There is left basilar atelectasis and small effusion.  On her physical exam, temperature 98.1.  Pulse is 116.  Blood pressure 142/74.  Her lungs sound pretty clear bilaterally.  I really do not hear much wheezing.  She sounds like she has good air movement.  Cardiac exam tachycardic but regular.  Abdomen is soft.  Extremities shows no clubbing, cyanosis or edema.  Neurological exam shows no focal deficits.  She does respond little bit to pain stimuli.  Nancy Parks still is in the throes of this encephalitis.  I would not think this is related to her having the smoldering myeloma.  Her immunoglobulin levels are decent.  Her leukopenia could be a issue.  She has been getting Granix.  I will try to make sure that her [place] is greater than 2.  I know she is getting fantastic care from everybody down in the ICU.  Nancy Haw, MD  Psalm 27:5

## 2018-06-26 NOTE — Progress Notes (Signed)
  Made NP Blount aware of the pt's increase work of breathing, lung sounds course crackles v/s 176/78, 96, 100 on r/a rr 36. PRN  Nebs and pain medication given, pt has been repositioned, orders given for portable chest xray. I will continue to monitor.

## 2018-06-26 NOTE — Progress Notes (Signed)
NAME:  Hanley Rispoli, MRN:  [id number], DOB:  1951-01-22, LOS: 6 ADMISSION DATE:  07/14/2018, CONSULTATION DATE: 06/21/2018 REFERRING MD: Lonny Prude, CHIEF COMPLAINT: Altered mental status  Brief History   68 year old female with a history of multiple myeloma admitted with progressive worsening in neurologic status.  2 week hx of headaches with intermittent hallucinations.with dx of HSV 1 encphalitis  .   Past Medical History  Multiple myeloma Chronic leukopenia Depression Recent changes in neurologic status  Significant Hospital Events   5/01 Admit with AMS 5/03 Tx to ICU, intubated 5/06 Extubated 5/08 On RA, more alert  5/9 more stuporous   Consults:  Neurology > signed off 5/6  PCCM 5/4  Procedures:  ETT 5/4 >> 5/6 TLC 5/4 >>   Significant Diagnostic Tests:  MRI Head 4/27 PTA >> no acute findings , no change from 2019  CT head 5/1 >> no acute findings  CT Chest 5/2 >> neg PE, LLL 8 mm nodule US Neck 5/2 >>no acute process  CT ABD / Pelvis 5/3 >> mild to mod bilateral hydronephrosis, mod distended bladder, sm effusion, BB atx, tr free fluid in pelvis  LP 5/3 >> clear fluid, glucose 39, WBC 44, neutrophils 7, RBC 6, Lymph 70, total protein 126, other cells present  EEG 5/5 >> general background slowing  Micro Data:  5/03 BC x 2 >> 5/01 BC x 2 >> negative 5/03 LP CSF >> cell count 44, predom. 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 rate (!) 25,  height 5' 10"  (1.778 m), weight 87.5 kg, SpO2 100 %.        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 Baseline immune suppression. CSF with increased lymphocytes c/w meningitis.  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 TSH, cortisol WNL.  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  Asked that I call roommate [name] for update 5/8 and reviewed plan of care.  Disposition: SDU  Clearly the high likelihood of prolonging suffering from pulmonary interventions vastly outweighs any reasonable chance of benefit from offering anything else because medical science has done all it can here to restore health.  Therefore  I don't have any additional recs  except to consider hospice sooner rather than later in this setting.     Labs   CBC: Recent Labs  Lab 06/22/18 0521 06/23/18 0500 06/24/18 0441 06/25/18 0513 06/26/18 0458  WBC 3.3* 1.6* 4.5 6.0 4.9  NEUTROABS  --   --   --  2.2 4.2  HGB 9.9* 9.4* 10.6* 11.1* 10.4*  HCT 29.0* 28.1* 32.0* 32.7* 32.0*  MCV 97.3 96.9 97.0 96.5 98.2  PLT 203 186 240 298 482    Basic Metabolic Panel: Recent Labs  Lab 06/21/18 0305  06/22/18 0521 06/23/18 0500 06/24/18 0441 06/25/18 0513 06/26/18 0458  NA 135   < > 135 134* 134* 134* 134*  K 4.0   < > 3.6 2.9* 3.1* 3.4* 3.7  CL 107   < > 106 102 103 101 101  CO2 17*   < > 23 25 22 23 23   GLUCOSE 87   < > 127* 148* 131* 129* 138*  BUN 12   < > 19 20 18  26* 42*  CREATININE 0.82   < > 0.62 0.61 0.55 0.56 1.06*  CALCIUM 7.8*   < > 7.7* 7.5* 7.9* 8.3* 8.5*  MG 2.1  --  2.0 1.7 2.0 2.2  --   PHOS  --   --  2.0*  --   --   --   --    < > = values in this interval not displayed.   GFR: Estimated Creatinine Clearance: 61 mL/min (A) (by C-G formula based on SCr of 1.06 mg/dL (H)). Recent Labs  Lab 06/20/18 1857  06/23/18 0500 06/24/18 0441 06/25/18 0513 06/26/18 0458  PROCALCITON 0.44  --   --   --   --   --   WBC  --    < > 1.6* 4.5 6.0 4.9   < > = values in this interval not displayed.    Liver Function Tests: Recent Labs  Lab 06/21/18 0305 06/22/18 0521 06/23/18 0500  06/24/18 0441 06/25/18 0513  AST 43* 29 25 33 36  ALT 22 19 18 24  32  ALKPHOS 66 52 52 72 95  BILITOT 1.0 0.7 0.2* 0.6 0.4  PROT 5.9* 5.5* 5.2* 6.4* 6.6  ALBUMIN 3.1* 2.7* 2.5* 3.0* 3.1*   No results for input(s): LIPASE, AMYLASE in the last 168 hours. Recent Labs  Lab 06/20/18 1732  AMMONIA 18  ABG    Component Value Date/Time   PHART 7.474 (H) 06/21/2018 1030   PCO2ART 27.0 (L) 06/21/2018 1030   PO2ART 105 06/21/2018 1030   HCO3 19.6 (L) 06/21/2018 1030   ACIDBASEDEF 2.8 (H) 06/21/2018 1030   O2SAT 98.3 06/21/2018 1030     Coagulation Profile: No results for input(s): INR, PROTIME in the last 168 hours.  Cardiac Enzymes: Recent Labs  Lab 06/20/18 0510 06/20/18 1857 06/22/18 0521  CKTOTAL 465* 602* 254*  TROPONINI  --  0.03*  --     HbA1C: Hgb A1c MFr Bld  Date/Time Value Ref Range Status  04/27/2017 03:51 PM 5.4 <5.7 % of total Hgb Final    Comment:    For the purpose of screening for the presence of diabetes: . <5.7%       Consistent with the absence of diabetes 5.7-6.4%    Consistent with increased risk for diabetes             (prediabetes) > or =6.5%  Consistent with diabetes . This assay result is consistent with a decreased risk of diabetes. . Currently, no consensus exists regarding use of hemoglobin A1c for diagnosis of diabetes in children. . According to American Diabetes Association (ADA) guidelines, hemoglobin A1c <7.0% represents optimal control in non-pregnant diabetic patients. Different metrics may apply to specific patient populations.  Standards of Medical Care in Diabetes(ADA). .     CBG: Recent Labs  Lab 06/25/18 1130 06/25/18 1814 06/25/18 2322 06/26/18 0642 06/26/18 1152  GLUCAP 133* 166* 136* 120* 129*     Christinia Gully, MD [facility] [phone] After 5:30 PM or weekends, use [facility] [phone]

## 2018-06-27 LAB — CBC WITH DIFFERENTIAL/PLATELET
Abs Immature Granulocytes: 0.04 10*3/uL (ref 0.00–0.07)
Basophils Absolute: 0 10*3/uL (ref 0.0–0.1)
Basophils Relative: 1 %
Eosinophils Absolute: 0 10*3/uL (ref 0.0–0.5)
Eosinophils Relative: 0 %
HCT: 32.7 % — ABNORMAL LOW (ref 36.0–46.0)
Hemoglobin: 10.2 g/dL — ABNORMAL LOW (ref 12.0–15.0)
Immature Granulocytes: 1 %
Lymphocytes Relative: 8 %
Lymphs Abs: 0.4 10*3/uL — ABNORMAL LOW (ref 0.7–4.0)
MCH: 32.2 pg (ref 26.0–34.0)
MCHC: 31.2 g/dL (ref 30.0–36.0)
MCV: 103.2 fL — ABNORMAL HIGH (ref 80.0–100.0)
Monocytes Absolute: 0.1 10*3/uL (ref 0.1–1.0)
Monocytes Relative: 3 %
Neutro Abs: 3.9 10*3/uL (ref 1.7–7.7)
Neutrophils Relative %: 87 %
Platelets: 415 10*3/uL — ABNORMAL HIGH (ref 150–400)
RBC: 3.17 MIL/uL — ABNORMAL LOW (ref 3.87–5.11)
RDW: 15.3 % (ref 11.5–15.5)
WBC: 4.4 10*3/uL (ref 4.0–10.5)
nRBC: 0 % (ref 0.0–0.2)

## 2018-06-27 LAB — GLUCOSE, CAPILLARY
Glucose-Capillary: 134 mg/dL — ABNORMAL HIGH (ref 70–99)
Glucose-Capillary: 135 mg/dL — ABNORMAL HIGH (ref 70–99)
Glucose-Capillary: 144 mg/dL — ABNORMAL HIGH (ref 70–99)
Glucose-Capillary: 168 mg/dL — ABNORMAL HIGH (ref 70–99)
Glucose-Capillary: 186 mg/dL — ABNORMAL HIGH (ref 70–99)

## 2018-06-27 LAB — BLOOD GAS, ARTERIAL
Acid-base deficit: 0.3 mmol/L (ref 0.0–2.0)
Bicarbonate: 27.5 mmol/L (ref 20.0–28.0)
FIO2: 40
O2 Saturation: 97.4 %
Patient temperature: 37
pCO2 arterial: 65.8 mmHg (ref 32.0–48.0)
pH, Arterial: 7.244 — ABNORMAL LOW (ref 7.350–7.450)
pO2, Arterial: 114 mmHg — ABNORMAL HIGH (ref 83.0–108.0)

## 2018-06-27 LAB — CULTURE, BLOOD (ROUTINE X 2)
Culture: NO GROWTH
Culture: NO GROWTH
Special Requests: ADEQUATE

## 2018-06-27 LAB — CREATININE, SERUM
Creatinine, Ser: 1.58 mg/dL — ABNORMAL HIGH (ref 0.44–1.00)
GFR calc Af Amer: 39 mL/min — ABNORMAL LOW (ref >60)
GFR calc non Af Amer: 33 mL/min — ABNORMAL LOW (ref >60)

## 2018-06-27 MED ORDER — METOPROLOL TARTRATE 5 MG/5ML IV SOLN
2.5000 mg | INTRAVENOUS | Status: DC | PRN
  Administered 2018-06-27: 05:00:00 5 mg via INTRAVENOUS
  Filled 2018-06-27: qty 5

## 2018-06-27 MED ORDER — DEXTROSE 5 % IV SOLN
800.0000 mg | Freq: Two times a day (BID) | INTRAVENOUS | Status: DC
  Administered 2018-06-27: 800 mg via INTRAVENOUS
  Filled 2018-06-27 (×2): qty 16

## 2018-06-28 NOTE — Progress Notes (Addendum)
The patient started to decline around 2298-05-24 with increased work of breathing, tachypnea, decrease of oxygenation on the BiPap and bradycardia. E-link and family were notified. Patient time of death 2348/05/24. Two RN's listened for 2 full minutes, Lonn [place], RN and [place], Therapist, sports.

## 2018-06-29 NOTE — Discharge Summary (Signed)
DEATH SUMMARY   Patient Details  Name: Nancy Parks MRN: [id number] DOB: 22-Feb-1950  Admission/Discharge Information   Admit Date:  06-23-18  Date of Death: Date of Death: 02-Jul-2018  Time of Death: Time of Death: 04-07-2348  Length of Stay: 8  Referring Physician: Hali Marry, MD   Reason(s) for Hospitalization  Patient was admitted for altered mental status and fever, there was associated nausea and vomiting  Diagnoses  Preliminary cause of death:   Acute hypoxemic and hypercarbic respiratory failure HSV encephalitis Encephalopathy  Secondary Diagnoses (including complications and co-morbidities):  Principal Problem:   Febrile illness Active Problems:   Migraine   Smoldering multiple myeloma ([place])   Altered mental status   Depression with anxiety   Lung nodule seen on imaging study   Hyponatremia   Hypokalemia   Encephalitis   Acute respiratory failure with hypoxia [facility])   Hydronephrosis   Brief Hospital Course (including significant findings, care, treatment, and services provided and events leading to death)  Nancy Parks is a 68 y.o. year old female who admitted to the hospital with a couple of days history of fever, nausea and vomiting.  1 week history of decreased oral intake. 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  Mental status did not improve despite being on antivirals. Not able to protect her airway She continued to deteriorate requiring to be placed on BiPAP.  Discussion had with family members about her lack of improvement despite ongoing care. Patient was made DNR and DO NOT INTUBATE with also  instituted. She deteriorated , despite ongoing care she finally succumbed to her illness on Jul 02, 2018 at 2350 hrs.    Pertinent Labs and Studies  Significant Diagnostic Studies Dg Abd 1 View  Result Date: 06/23/2018 CLINICAL DATA:  Feeding tube placement. EXAM: ABDOMEN - 1 VIEW COMPARISON:  None. FINDINGS: Feeding tube is in place with the tip in the distal stomach directed toward the duodenum. Bowel gas pattern is normal. IMPRESSION: As above. Electronically Signed   By: Inge Rise M.D.   On: 06/23/2018 10:25   Dg Abd 1 View  Result Date: 06/21/2018 CLINICAL DATA:  OG tube placement EXAM: ABDOMEN - 1 VIEW COMPARISON:  None. FINDINGS: Orogastric tube tip lies in the stomach with its tip along the greater curvature. The last side hole likely lies at or just below the gastroesophageal junction. Gas pattern is nonspecific. IMPRESSION: Orogastric tube tip lies in the stomach. Electronically Signed   By: Staci Righter M.D.   On: 06/21/2018 10:22   Ct Head Wo Contrast  Result Date: 06-23-2018 CLINICAL DATA:  Altered level of consciousness, headache, fever, sinus infection, confusion, disorientation, history multiple myeloma EXAM: CT HEAD WITHOUT CONTRAST TECHNIQUE: Contiguous axial images were obtained from the base of the skull through the vertex without intravenous contrast. Sagittal and coronal MPR images reconstructed from axial data set. COMPARISON:  03/20/2015 Correlation: MR brain 06/14/2018 FINDINGS: Brain: Generalized atrophy. Normal ventricular morphology. No midline shift or mass effect. Small vessel chronic ischemic changes of deep cerebral white matter. No intracranial hemorrhage, mass lesion, evidence of acute infarction, or extra-axial fluid collection. Vascular: No definite hyperdense vessels Skull: Intact Sinuses/Orbits: Clear Other: N/A IMPRESSION: Atrophy with small vessel chronic ischemic changes  of deep cerebral white matter. No acute intracranial abnormalities. Electronically  Signed   By: Lavonia Dana M.D.   On: 06/19/2018 23:10   Ct Angio Chest Pe W And/or Wo Contrast  Result Date: 06/19/2018 CLINICAL DATA:  Chest pain, shortness of breath, positive D-dimer EXAM: CT ANGIOGRAPHY CHEST WITH CONTRAST TECHNIQUE: Multidetector CT imaging of the chest was performed using the standard protocol during bolus administration of intravenous contrast. Multiplanar CT image reconstructions and MIPs were obtained to evaluate the vascular anatomy. CONTRAST:  165m OMNIPAQUE IOHEXOL 350 MG/ML SOLN COMPARISON:  07/15/2018 FINDINGS: Cardiovascular: Heart is normal size. Aorta is normal caliber. No filling defects in the pulmonary arteries to suggest pulmonary emboli. Mediastinum/Nodes: No mediastinal, hilar, or axillary adenopathy. Lungs/Pleura: Bibasilar atelectasis. Nodular density in the superior segment of the left lower lobe lung the aortic arch posteriorly and medially measures 8 mm. No effusions. Upper Abdomen: Imaging into the upper abdomen shows no acute findings. Musculoskeletal: Chest wall soft tissues are unremarkable. No acute bony abnormality. Review of the MIP images confirms the above findings. IMPRESSION: No evidence of pulmonary embolus. Bibasilar atelectasis. 8 mm nodule in the medial superior segment of the left lower lobe adjacent to the aortic arch. Non-contrast chest CT at 6-12 months is recommended. If the nodule is stable at time of repeat CT, then future CT at 18-24 months (from today's scan) is considered optional for low-risk patients, but is recommended for high-risk patients. This recommendation follows the consensus statement: Guidelines for Management of Incidental Pulmonary Nodules Detected on CT Images: From the Fleischner Society 2017; Radiology 2017; 284:228-243. Electronically Signed   By: KRolm BaptiseM.D.   On: 06/19/2018 00:25   Mr BJeri CosWIEContrast  Result Date: 06/14/2018 CLINICAL DATA:  Frequent migraines. Associated hallucinations and incoordination.  EXAM: MRI HEAD WITHOUT AND WITH CONTRAST TECHNIQUE: Multiplanar, multiecho pulse sequences of the brain and surrounding structures were obtained without and with intravenous contrast. CONTRAST:  179mMULTIHANCE GADOBENATE DIMEGLUMINE 529 MG/ML IV SOLN COMPARISON:  10/17/2017. FINDINGS: Brain: No evidence for acute infarction, hemorrhage, mass lesion, hydrocephalus, or extra-axial fluid. Normal cerebral volume. Mild to moderate subcortical and periventricular T2 and FLAIR hyperintensities, likely chronic microvascular ischemic change. Post infusion, no abnormal enhancement of the brain or meninges. Vascular: Normal flow voids. Skull and upper cervical spine: Normal marrow signal. Sinuses/Orbits: Negative. Other: None. IMPRESSION: No acute intracranial findings. Mild to moderate white matter signal abnormality, likely chronic microvascular ischemic change. No abnormal postcontrast enhancement. No significant change compared with prior study in 2019. Electronically Signed   By: JoStaci Righter.D.   On: 06/14/2018 16:41   Us[place]ft Tissue Head & Neck (non-thyroid)  Result Date: 06/19/2018 CLINICAL DATA:  Neck mass lateral to trachea EXAM: ULTRASOUND OF HEAD/NECK SOFT TISSUES TECHNIQUE: Ultrasound examination of the head and neck soft tissues was performed in the area of clinical concern. COMPARISON:  None. FINDINGS: No mass, cyst, adenopathy, aneurysm, abscess, or other pathologic findings on survey interrogation of the region of concern and contralateral region. IMPRESSION: No pathologic findings on ultrasound. Electronically Signed   By: D Lucrezia Europe.D.   On: 06/19/2018 12:28   Ct Abdomen Pelvis W Contrast  Result Date: 06/21/2018 CLINICAL DATA:  Nausea, vomiting, fever EXAM: CT ABDOMEN AND PELVIS WITH CONTRAST TECHNIQUE: Multidetector CT imaging of the abdomen and pelvis was performed using the standard protocol following bolus administration of intravenous contrast. CONTRAST:  10071mMNIPAQUE IOHEXOL 300 MG/ML   SOLN COMPARISON:  None. FINDINGS: Lower chest: Small bilateral  pleural effusions with bilateral lower lobe airspace opacities. Heart is normal size. Hepatobiliary: No focal hepatic abnormality. Gallbladder grossly unremarkable. Pancreas: No focal abnormality or ductal dilatation. Spleen: No focal abnormality.  Normal size. Adrenals/Urinary Tract: Mild-to-moderate bilateral hydronephrosis. Urinary bladder is distended. No visible stones. Adrenal glands unremarkable. Stomach/Bowel: Normal appendix. Stomach, large and small bowel grossly unremarkable. Vascular/Lymphatic: Aortic atherosclerosis. No enlarged abdominal or pelvic lymph nodes. Reproductive: No pelvic mass. Other: Trace free fluid in the pelvis.  No free air. Musculoskeletal: No acute bony abnormality. IMPRESSION: Mild-to-moderate bilateral hydronephrosis. Urinary bladder is moderately distended. No visible obstructing stones. Small bilateral pleural effusions. Bilateral lower lobe atelectasis or pneumonia. Aortic atherosclerosis. Trace free fluid in the pelvis. Electronically Signed   By: Rolm Baptise M.D.   On: 06/21/2018 00:35   Dg Chest Port 1 View  Result Date: 06/26/2018 CLINICAL DATA:  Per order- Sob  PT HX: non smoker EXAM: PORTABLE CHEST 1 VIEW COMPARISON:  06/25/2018 FINDINGS: Feeding tube extends the stomach. Central venous line with tip in the distal SVC. Normal cardiac silhouette. LEFT basilar atelectasis and small effusion similar. Upper lungs clear. IMPRESSION: 1. No interval change. 2. LEFT basilar atelectasis and effusion. 3. Stable support apparatus Electronically Signed   By: Suzy Bouchard M.D.   On: 06/26/2018 05:00   Dg Chest Port 1 View  Result Date: 06/25/2018 CLINICAL DATA:  Altered mental status with shortness of breath EXAM: PORTABLE CHEST 1 VIEW COMPARISON:  Yesterday FINDINGS: Feeding tube which at least reaches the stomach. Right IJ line with tip at the upper right atrium. Unchanged hazy appearance of the bilateral chest  attributed to atelectasis and pleural fluid as seen on abdominal CT 06/21/2018. Normal heart size. IMPRESSION: Stable bilateral atelectasis and pleural fluid. Electronically Signed   By: Monte Fantasia M.D.   On: 06/25/2018 07:30   Dg Chest Port 1 View  Result Date: 06/24/2018 CLINICAL DATA:  Respiratory failure. EXAM: PORTABLE CHEST 1 VIEW COMPARISON:  Chest x-ray from yesterday. FINDINGS: Interval removal of the endotracheal tube. Unchanged feeding tube and right internal jugular central venous catheter. The heart size and mediastinal contours are within normal limits. Normal pulmonary vascularity. Unchanged small left pleural effusion and mild bibasilar atelectasis. No pneumothorax. No acute osseous abnormality. IMPRESSION: 1. Unchanged small left pleural effusion and bibasilar atelectasis. Electronically Signed   By: Titus Dubin M.D.   On: 06/24/2018 07:52   Dg Chest Port 1 View  Result Date: 06/23/2018 CLINICAL DATA:  Shortness of breath.  Altered mental status. EXAM: PORTABLE CHEST 1 VIEW COMPARISON:  Radiographs dated 06/22/2018, 06/21/2018, 06/20/2018 and 07/12/2018 and chest CT dated 06/19/2018 FINDINGS: Endotracheal tube is in good position. Feeding tube tip is below the diaphragm. Central venous catheter tip is at the cavoatrial junction, unchanged. Heart size and pulmonary vascularity are. Increased small left pleural effusion. Slight atelectasis at the right lung base, improved. IMPRESSION: 1. Increased small left pleural effusion. 2. Improved slight atelectasis at the right lung base. Electronically Signed   By: Lorriane Shire M.D.   On: 06/23/2018 11:54   Dg Chest Port 1 View  Result Date: 06/22/2018 CLINICAL DATA:  Acute respiratory failure. EXAM: PORTABLE CHEST 1 VIEW COMPARISON:  Chest x-ray from same day. FINDINGS: The patient is now rotated to the left. Unchanged endotracheal and enteric tubes. Unchanged right internal jugular central venous catheter. The heart size and mediastinal  contours are within normal limits. Normal pulmonary vascularity. Persistent low lung volumes with unchanged hazy opacities at both lung bases related to  atelectasis and pleural effusions. No pneumothorax. No acute osseous abnormality. IMPRESSION: 1. Unchanged bibasilar atelectasis and small pleural effusions. Electronically Signed   By: Titus Dubin M.D.   On: 06/22/2018 13:25   Portable Chest Xray  Result Date: 06/22/2018 CLINICAL DATA:  Intubation EXAM: PORTABLE CHEST 1 VIEW COMPARISON:  Yesterday FINDINGS: Endotracheal tube tip is 2 cm above the carina. The orogastric tube at least reaches the stomach. Right IJ line with tip at the right atrium. Haziness of the bilateral chest likely from atelectasis and pleural fluid. There are a few Kerley lines. No pneumothorax. Normal heart size. IMPRESSION: 1. Unremarkable hardware positioning. 2. Lower volumes and increased hazy opacification of the chest correlating with atelectasis and pleural fluid on abdominal CT from yesterday. Electronically Signed   By: Monte Fantasia M.D.   On: 06/22/2018 04:50   Portable Chest X-ray  Result Date: 06/21/2018 CLINICAL DATA:  Endotracheal tube placement. EXAM: PORTABLE CHEST 1 VIEW COMPARISON:  06/20/2018 FINDINGS: Endotracheal tube has tip 3.9 cm above the carina. Enteric tube courses into the region of the stomach and off the film as tip is not visualized. Right IJ central venous catheter has tip over the right atrium and could be pulled back approximately 4-5 cm. Stable elevation right hemidiaphragm. Stable linear density right base likely atelectasis. Mild hazy opacification over the left lung likely layering effusion. Cardiomediastinal silhouette and remainder of the exam is unchanged. IMPRESSION: Linear atelectasis right base. Hazy opacification over the left lung possibly layering effusion. Tubes and lines as described. Note that the right IJ central venous catheter has tip over the right atrium and could be withdrawn  approximately 4-5 cm. These results were called by telephone at the time of interpretation on 06/21/2018 at 10:27 a.m. to patient's nurse, Lyla Glassing, who verbally acknowledged these results. Electronically Signed   By: Marin Olp M.D.   On: 06/21/2018 10:27   Dg Chest Port 1 View  Result Date: 06/20/2018 CLINICAL DATA:  Shortness of breath, dyspnea EXAM: PORTABLE CHEST 1 VIEW COMPARISON:  06/19/2018 FINDINGS: There is hazy bibasilar airspace disease which may reflect atelectasis versus pneumonia. There is no pneumothorax. There is a trace left pleural effusion. The heart and mediastinal contours are unremarkable. The osseous structures are unremarkable. IMPRESSION: 1. Hazy bibasilar airspace disease which may reflect atelectasis versus pneumonia. Electronically Signed   By: Kathreen Devoid   On: 06/20/2018 16:39   Dg Chest Port 1 View  Result Date: 06/23/2018 CLINICAL DATA:  Fever, sinus infection, headaches EXAM: PORTABLE CHEST 1 VIEW COMPARISON:  07/13/2012 FINDINGS: There is no focal parenchymal opacity. There is no pleural effusion or pneumothorax. The heart and mediastinal contours are unremarkable. The osseous structures are unremarkable. IMPRESSION: No active disease. Electronically Signed   By: Kathreen Devoid   On: 07/12/2018 20:59   Dg Fl Guided Lumbar Puncture  Result Date: 06/20/2018 CLINICAL DATA:  Altered mental status, fever EXAM: DIAGNOSTIC LUMBAR PUNCTURE UNDER FLUOROSCOPIC GUIDANCE FLUOROSCOPY TIME:  Fluoroscopy Time:  0 minutes 13 seconds Radiation Exposure Index (if provided by the fluoroscopic device): 1.9 mGy Number of Acquired Spot Images: 1 PROCEDURE: Procedure, benefits, and risks were discussed with the patient's healthcare power of attorney, Lamont Snowball, including alternatives. Her questions were answered. Written informed consent was obtained. Timeout protocol followed. Patient placed prone. L4-L5 disc space was localized under fluoroscopy. Skin prepped and draped in usual  sterile fashion. Skin and soft tissues anesthetized with 4 mL of 1% lidocaine. 22 needle was advanced into the spinal canal  where clear colorless CSF was encountered with an opening pressure of 15.5 H2O (prone). 9 mL of CSF was obtained in 4 tubes for requested analysis. Procedure tolerated very well by patient without immediate complication. IMPRESSION: Successful fluoroscopic guided lumbar puncture as above. Electronically Signed   By: Lavonia Dana M.D.   On: 06/20/2018 14:51    Microbiology Recent Results (from the past 240 hour(s))  CSF culture     Status: None   Collection Time: 06/20/18  2:35 PM  Result Value Ref Range Status   Specimen Description   Final    CSF Performed at [place] [address], [place], [place] [address]    Special Requests   Final    NONE Performed at [facility], [place] [address] [place], [place] [address]    Gram Stain   Final    WBC PRESENT, PREDOMINANTLY MONONUCLEAR NO ORGANISMS SEEN CALLED TICKET,S RN _0  ON 06/20/2018 Seward Speck Performed at [facility], [place] [address], [place], [place] [address]    Culture   Final    NO GROWTH 3 DAYS Performed at [facility], [place] [address] [place], [place] [address]    Report Status 06/24/2018 FINAL  Final  Culture, fungus without smear     Status: None (Preliminary result)   Collection Time: 06/20/18  2:35 PM  Result Value Ref Range Status   Specimen Description   Final    CSF Performed at [place] [address], [place], [place] [address]    Special Requests   Final    NONE Performed at [facility], [place] [address] [place], [place] [address]    Culture   Final    NO FUNGUS ISOLATED AFTER 7 DAYS Performed at [facility], [place] [address] [place], [place] [address]    Report Status PENDING  Incomplete  Culture, blood (routine x 2)     Status: None   Collection Time: 06/20/18  4:03 PM   Result Value Ref Range Status   Specimen Description   Final    BLOOD LEFT HAND Performed at [place] [address], [place], [place] [address]    Special Requests   Final    BOTTLES DRAWN AEROBIC ONLY Blood Culture results may not be optimal due to an inadequate volume of blood received in culture bottles Performed at [place] [address] [place], [place] [address]    Culture   Final    NO GROWTH 6 DAYS Performed at [facility], [place] [address] [place], [place] [address]    Report Status Jul 24, 2018 FINAL  Final  Culture, blood (routine x 2)     Status: None   Collection Time: 06/20/18  4:03 PM  Result Value Ref Range Status   Specimen Description   Final    RIGHT ANTECUBITAL Performed at [place] [address], [place], [place] [address]    Special Requests   Final    BOTTLES DRAWN AEROBIC ONLY Blood Culture adequate volume Performed at [place] [address], [place], [place] [address]    Culture   Final    NO GROWTH 6 DAYS Performed at [facility], [place] [address], [place], [place] [address]    Report Status 07/24/2018 FINAL  Final  Novel Coronavirus,NAA,(SEND-OUT TO REF LAB - TAT 24-48 hrs); Hosp Order     Status: None  Collection Time: 06/20/18  5:05 PM  Result Value Ref Range Status   SARS-CoV-2, NAA NOT DETECTED NOT DETECTED Final    Comment: (NOTE) This test was developed and its performance characteristics determined by [employer]. This test has not been FDA cleared or approved. This test has been authorized by FDA under an Emergency Use Authorization (EUA). This test is only authorized for the duration of time the declaration that circumstances exist justifying the authorization of the emergency use of in vitro diagnostic tests for detection of SARS-CoV-2 virus and/or diagnosis of COVID-19 infection under section 564(b)(1) of the Act, 21  U.S.C. 536RWE-3(X)(5), unless the authorization is terminated or revoked sooner. When diagnostic testing is negative, the possibility of a false negative result should be considered in the context of a patient's recent exposures and the presence of clinical signs and symptoms consistent with COVID-19. An individual without symptoms of COVID-19 and who is not shedding SARS-CoV-2 virus would expect to have a negative (not detected) result in this assay. Performed  At: [facility] [place], [place] [id number] Rush Farmer MD JK:[id number]    [place]  Final    Comment: Performed at [place] [address], [place], [place] [address]  SARS Coronavirus 2 [facility] order, Performed in [facility] hospital lab)     Status: None   Collection Time: 06/20/18  5:05 PM  Result Value Ref Range Status   SARS Coronavirus 2 NEGATIVE NEGATIVE Final    Comment: (NOTE) If result is NEGATIVE SARS-CoV-2 target nucleic acids are NOT DETECTED. The SARS-CoV-2 RNA is generally detectable in upper and lower  respiratory specimens during the acute phase of infection. The lowest  concentration of SARS-CoV-2 viral copies this assay can detect is 250  copies / mL. A negative result does not preclude SARS-CoV-2 infection  and should not be used as the sole basis for treatment or other  patient management decisions.  A negative result may occur with  improper specimen collection / handling, submission of specimen other  than nasopharyngeal swab, presence of viral mutation(s) within the  areas targeted by this assay, and inadequate number of viral copies  (<250 copies / mL). A negative result must be combined with clinical  observations, patient history, and epidemiological information. If result is POSITIVE SARS-CoV-2 target nucleic acids are DETECTED. The SARS-CoV-2 RNA is generally detectable in upper and lower  respiratory specimens  dur ing the acute phase of infection.  Positive  results are indicative of active infection with SARS-CoV-2.  Clinical  correlation with patient history and other diagnostic information is  necessary to determine patient infection status.  Positive results do  not rule out bacterial infection or co-infection with other viruses. If result is PRESUMPTIVE POSTIVE SARS-CoV-2 nucleic acids MAY BE PRESENT.   A presumptive positive result was obtained on the submitted specimen  and confirmed on repeat testing.  While 2019 novel coronavirus  (SARS-CoV-2) nucleic acids may be present in the submitted sample  additional confirmatory testing may be necessary for epidemiological  and / or clinical management purposes  to differentiate between  SARS-CoV-2 and other Sarbecovirus currently known to infect humans.  If clinically indicated additional testing with an alternate test  methodology [phone]) is advised. The SARS-CoV-2 RNA is generally  detectable in upper and lower respiratory sp ecimens during the acute  phase of infection. The expected result is Negative. Fact Sheet for Patients:  StrictlyIdeas.no Fact Sheet for Healthcare Providers: BankingDealers.co.za This test is  not yet approved or cleared by the [place] and has been authorized for detection and/or diagnosis of SARS-CoV-2 by FDA under an Emergency Use Authorization (EUA).  This EUA will remain in effect (meaning this test can be used) for the duration of the COVID-19 declaration under Section 564(b)(1) of the Act, 21 U.S.C. section 360bbb-3(b)(1), unless the authorization is terminated or revoked sooner. Performed at [facility], [place] [address] [place], [place] [address]   Respiratory Panel by PCR     Status: None   Collection Time: 06/20/18  5:07 PM  Result Value Ref Range Status   Adenovirus NOT DETECTED NOT DETECTED Final   Coronavirus 229E NOT  DETECTED NOT DETECTED Final    Comment: (NOTE) The Coronavirus on the Respiratory Panel, DOES NOT test for the novel  Coronavirus (2019 nCoV)    Coronavirus HKU1 NOT DETECTED NOT DETECTED Final   Coronavirus NL63 NOT DETECTED NOT DETECTED Final   Coronavirus OC43 NOT DETECTED NOT DETECTED Final   Metapneumovirus NOT DETECTED NOT DETECTED Final   Rhinovirus / Enterovirus NOT DETECTED NOT DETECTED Final   Influenza A NOT DETECTED NOT DETECTED Final   Influenza B NOT DETECTED NOT DETECTED Final   Parainfluenza Virus 1 NOT DETECTED NOT DETECTED Final   Parainfluenza Virus 2 NOT DETECTED NOT DETECTED Final   Parainfluenza Virus 3 NOT DETECTED NOT DETECTED Final   Parainfluenza Virus 4 NOT DETECTED NOT DETECTED Final   Respiratory Syncytial Virus NOT DETECTED NOT DETECTED Final   Bordetella pertussis NOT DETECTED NOT DETECTED Final   Chlamydophila pneumoniae NOT DETECTED NOT DETECTED Final   Mycoplasma pneumoniae NOT DETECTED NOT DETECTED Final    Comment: Performed at [facility], [address] [place], [place] [address]  MRSA PCR Screening     Status: Abnormal   Collection Time: 06/22/18  6:35 PM  Result Value Ref Range Status   MRSA by PCR POSITIVE (A) NEGATIVE Final    Comment:        The GeneXpert MRSA Assay (FDA approved for NASAL specimens only), is one component of a comprehensive MRSA colonization surveillance program. It is not intended to diagnose MRSA infection nor to guide or monitor treatment for MRSA infections. RESULT CALLED TO, READ BACK BY AND VERIFIED WITH: H.LETCHFORD AT 2121 ON 06/22/18 BY N.THOMPSON Performed at [facility], [place] [address] [place], [place] [address]     Lab Basic Metabolic Panel: Recent Labs  Lab 06/23/18 0500 06/24/18 0441 06/25/18 0513 06/26/18 0458 2018-07-01 0523  NA 134* 134* 134* 134*  --   K 2.9* 3.1* 3.4* 3.7  --   CL 102 103 101 101  --   CO2 _0 --   GLUCOSE 148* 131* 129* 138*  --    BUN 20 18 26* 42*  --   CREATININE 0.61 0.55 0.56 1.06* 1.58*  CALCIUM 7.5* 7.9* 8.3* 8.5*  --   MG 1.7 2.0 2.2  --   --    Liver Function Tests: Recent Labs  Lab 06/23/18 0500 06/24/18 0441 06/25/18 0513  AST 25 33 36  ALT 18 24 32  ALKPHOS 52 72 95  BILITOT 0.2* 0.6 0.4  PROT 5.2* 6.4* 6.6  ALBUMIN 2.5* 3.0* 3.1*   No results for input(s): LIPASE, AMYLASE in the last 168 hours. No results for input(s): AMMONIA in the last 168 hours. CBC: Recent Labs  Lab 06/23/18 0500 06/24/18 0441 06/25/18 3845 06/26/18 0458 2018/07/01 3646  WBC 1.6* 4.5 6.0 4.9 4.4  NEUTROABS  --   --  2.2 4.2 3.9  HGB 9.4* 10.6* 11.1* 10.4* 10.2*  HCT 28.1* 32.0* 32.7* 32.0* 32.7*  MCV 96.9 97.0 96.5 98.2 103.2*  PLT 186 240 298 333 415*   Cardiac Enzymes: No results for input(s): CKTOTAL, CKMB, CKMBINDEX, TROPONINI in the last 168 hours. Sepsis Labs: Recent Labs  Lab 06/24/18 0441 06/25/18 0513 06/26/18 0458 07-10-18 0523  WBC 4.5 6.0 4.9 4.4    Procedures/Operations  Endotracheal intubation 06/21/2018 Central line placement 06/21/2018 Lumbar puncture 06/20/2018  Tallyn Holroyd A Jewelz Kobus 06/29/2018, 7:38 PM

## 2018-07-01 ENCOUNTER — Ambulatory Visit: Payer: Medicare HMO | Admitting: Psychology

## 2018-07-06 ENCOUNTER — Telehealth: Payer: Self-pay | Admitting: *Deleted

## 2018-07-06 NOTE — Telephone Encounter (Signed)
Received original d/c from [employer] home -D/C forwarded to Dr.Wert(pulmonary) to sign for Dr.Olalore.

## 2018-07-08 NOTE — Telephone Encounter (Signed)
Received  Original signed D/C -funeral home notified with requested fax along with mailing D/C to vital records.

## 2018-07-13 LAB — CULTURE, FUNGUS WITHOUT SMEAR

## 2018-07-15 ENCOUNTER — Ambulatory Visit: Payer: Medicare HMO | Admitting: Psychology

## 2018-07-19 ENCOUNTER — Ambulatory Visit: Payer: Self-pay | Admitting: Hematology & Oncology

## 2018-07-19 ENCOUNTER — Other Ambulatory Visit: Payer: Self-pay

## 2018-07-19 NOTE — Progress Notes (Signed)
Made Dr Deterding aware of the Pt's HR 130's. No orders given at this time.

## 2018-07-19 NOTE — Progress Notes (Signed)
Pharmacy Antibiotic Note  Nancy Parks is a 68 y.o. female presented to the ED on 07/12/2018 with c/o fever, headache and AMS. She recently started on Augmentin PTA for suspected sinus tract infection. COVID test completed on admission was negative. Neurologist consulted and differential includes viral encephalitis vs meningitis vs Serotonin syndrome. Plan for LP by IR on 5/3 and to start broad abx with vancomycin, ceftriaxone, ampicillin and acyclovir for r/o meningitis. 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58*    Estimated Creatinine Clearance: 36.9 mL/min (A) (by C-G formula based on SCr of 1.58 mg/dL (H)).    Allergies  Allergen Reactions  . Latex Rash    REACTION: Rash  REACTION: Rash  . Other Rash  . Tape Rash  . Fosamax [Alendronate Sodium] Other (See Comments)    GI upset    Antimicrobials this admission:  5/2 vanc>> 5/3 5/2 CTX>> 5/3 Cefepime >> 5/3 5/2 ampicillin>>5/6 5/2 acyclovir>> Dose adjustments this admission:  5/10 decr to 10mg /kg q12 for CrCl 37 ml/min Microbiology results:  5/1 BCx x2: ngF 5/1 covid: negative 5/3 covid: negative 5/3 Covid sent out: negataive 5/2 HIV antb: NR 5/3 Flu A/B neg 5/3 RPR NR 5/3 HSV DNA PCR CSF> HSV 1 DNA positive HSV 2 DNA negative 5/4 strep pneumo Ug negative  5/3 RVP  neg 5/3 BCx2>>ngF 5/3 CSF>>NGF 5/4 legionella>> 5/5 MRSA PCR positive  Thank you for allowing pharmacy to be a part of this patient's care.  Eudelia Bunch, Pharm.D 07-12-2018 9:06 AM

## 2018-07-19 NOTE — Progress Notes (Signed)
NAME:  Nancy Parks, MRN:  [id number], DOB:  24-Jun-1950, LOS: 7 ADMISSION DATE:  06/22/2018, CONSULTATION DATE: 06/21/2018 REFERRING MD: Lonny Prude, CHIEF COMPLAINT: Altered mental status  Brief History   68 year old female with a history of multiple myeloma admitted with progressive worsening in neurologic status.  2 week hx of headaches with intermittent hallucinations.with dx of HSV 1 encphalitis  .   Past Medical History  Multiple myeloma Chronic leukopenia Depression Recent changes in neurologic status  Significant Hospital Events   5/01 Admit with AMS 5/03 Tx to ICU, intubated 5/06 Extubated 5/08 On RA, more alert  5/9 more stuporous   Consults:  Neurology > signed off 5/6  PCCM 5/4  Procedures:  ETT 5/4 >> 5/6 TLC 5/4 >>   Significant Diagnostic Tests:  MRI Head 4/27 PTA >> no acute findings , no change from 2019  CT head 5/1 >> no acute findings  CT Chest 5/2 >> neg PE, LLL 8 mm nodule US Neck 5/2 >>no acute process  CT ABD / Pelvis 5/3 >> mild to mod bilateral hydronephrosis, mod distended bladder, sm effusion, BB atx, tr free fluid in pelvis  LP 5/3 >> clear fluid, glucose 39, WBC 44, neutrophils 7, RBC 6, Lymph 70, total protein 126, other cells present  EEG 5/5 >> general background slowing  Micro Data:  5/03 BC x 2 >> 5/01 BC x 2 >> negative 5/03 LP CSF >> cell count 44, predom. Lymphocytes , elevated protein 126 5/03 CSF culture >> negative  5/01 COVID >> NEGATIVE  5/03 COVID >> NEGATIVE  5/04 Strep Pneu Ag >> NEG  5/04 Legionella >>  5/03 Viral panel >> NEG  5/03 COVID (send out ) >> negative 5/03 HSV PCR >> HSV 1 POSITIVE 5/03 RPR >> negative 5/07 HIV >> negative   Antimicrobials:  Acyclovir 5/2 (empiric viral encephalitis/ 21 days planned >>  Ampicillin 5/3 (empiric listeria) >> 5/6  Interim history / subjective:  Increased work of breathing On BiPAP  Objective   Blood pressure 138/63, pulse (!) 107, temperature 99.1 F (37.3 C), resp. rate (!)  32, height 5' 10"  (1.778 m), weight 82 kg, SpO2 100 %.    FiO2 (%):  [30 %-40 %] 40 %   Intake/Output Summary (Last 24 hours) at 07/11/2018 1239 Last data filed at 07-11-2018 1216 Gross per 24 hour  Intake 1579.34 ml  Output 2315 ml  Net -735.66 ml   Filed Weights   06/25/18 0456 06/26/18 0459 11-Jul-2018 0500  Weight: 82 kg 87.5 kg 82 kg    Examination: Unresponsive, on BiPAP No JVD Neck is supple Rhonchi appreciated S1-S2 appreciated Bowel sounds appreciated Extremities warm and dry    Resolved Hospital Problem list      Assessment & Plan:   Acute encephalitis with acute progressive neurological deterioration -HSV encephalitis -Continue acyclovir for 21 days total -No significant improvement in mental status  Acute hypoxic respiratory failure -Intubated from 5/4-5/6 -Placed on BiPAP overnight  Risk of aspiration as mental status remains very poor  Electrolyte derangement -Trend electrolytes -Replete electrolytes as needed -Avoid nephrotoxic agents  Myeloma/chronic leukopenia -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  Asked that I call roommate [name] for update 5/8 and reviewed plan of care.  Disposition: SDU  This appears to be end-of-life process Continue BiPAP support Likelihood of significant neurological recovery is poor   Labs   CBC: Recent Labs  Lab 06/23/18 0500 06/24/18 0441 06/25/18 0513 06/26/18 0458 07/16/2018 0523  WBC 1.6* 4.5 6.0 4.9 4.4  NEUTROABS  --   --   2.2 4.2 3.9  HGB 9.4* 10.6* 11.1* 10.4* 10.2*  HCT 28.1* 32.0* 32.7* 32.0* 32.7*  MCV 96.9 97.0 96.5 98.2 103.2*  PLT 186 240 298 333 415*    Basic Metabolic Panel: Recent Labs  Lab 06/21/18 0305  06/22/18 0521 06/23/18 0500 06/24/18 0441 06/25/18 0513 06/26/18 0458 07/16/2018 0523  NA 135   < > 135 134* 134* 134* 134*  --   K 4.0   < > 3.6 2.9* 3.1* 3.4* 3.7  --   CL 107   < > 106 102 103 101 101  --   CO2 17*   < > 23 25 22 23 23   --   GLUCOSE 87   < > 127* 148* 131* 129* 138*  --   BUN 12   < > 19 20 18  26* 42*  --   CREATININE 0.82   < > 0.62 0.61 0.55 0.56 1.06* 1.58*  CALCIUM 7.8*   < > 7.7* 7.5* 7.9* 8.3* 8.5*  --   MG 2.1  --  2.0 1.7 2.0 2.2  --   --   PHOS  --   --  2.0*  --   --   --   --   --    < > = values in this interval not displayed.   GFR: Estimated Creatinine Clearance: 36.9 mL/min (A) (by C-G formula based on SCr of 1.58 mg/dL (H)). Recent Labs  Lab 06/20/18 1857  06/24/18 0441 06/25/18 0513 06/26/18 0458 2018/07/16 0523  PROCALCITON 0.44  --   --   --   --   --   WBC  --    < > 4.5 6.0 4.9 4.4   < > = values in this interval not displayed.    Liver Function Tests: Recent Labs  Lab 06/21/18 0305 06/22/18 0521 06/23/18 0500 06/24/18 0441 06/25/18 0513  AST 43* 29 25 33 36  ALT 22 19 18 24  32  ALKPHOS 66 52 52 72 95  BILITOT 1.0 0.7 0.2* 0.6 0.4  PROT 5.9* 5.5* 5.2* 6.4* 6.6  ALBUMIN 3.1* 2.7* 2.5* 3.0* 3.1*   No results for input(s): LIPASE, AMYLASE in the last 168 hours. Recent Labs  Lab 06/20/18 1732  AMMONIA 18    ABG    Component Value Date/Time   PHART 7.474 (H) 06/21/2018 1030   PCO2ART 27.0 (L) 06/21/2018 1030   PO2ART 105 06/21/2018 1030   HCO3 19.6 (L) 06/21/2018 1030   ACIDBASEDEF 2.8 (H) 06/21/2018 1030   O2SAT 98.3 06/21/2018 1030     Coagulation Profile: No results for input(s): INR, PROTIME in the last 168 hours.  Cardiac Enzymes: Recent Labs  Lab 06/20/18 1857 06/22/18 0521  CKTOTAL 602* 254*   TROPONINI 0.03*  --     HbA1C: Hgb A1c MFr Bld  Date/Time Value Ref Range Status  04/27/2017 03:51 PM 5.4 <5.7 % of total Hgb Final    Comment:    For the purpose of screening for the  presence of diabetes: . <5.7%       Consistent with the absence of diabetes 5.7-6.4%    Consistent with increased risk for diabetes             (prediabetes) > or =6.5%  Consistent with diabetes . This assay result is consistent with a decreased risk of diabetes. . Currently, no consensus exists regarding use of hemoglobin A1c for diagnosis of diabetes in children. . According to American Diabetes Association (ADA) guidelines, hemoglobin A1c <7.0% represents optimal control in non-pregnant diabetic patients. Different metrics may apply to specific patient populations.  Standards of Medical Care in Diabetes(ADA). .     CBG: Recent Labs  Lab 06/26/18 1152 06/26/18 1731 Jul 13, 2018 0010 Jul 13, 2018 0514 Jul 13, 2018 1151  GLUCAP 129* 139* 135* 168* 144*   The patient is critically ill with multiple organ system failure and requires high complexity decision making for assessment and support, frequent evaluation and titration of therapies, advanced monitoring, review of radiographic studies and interpretation of complex data.    Critical Care Time devoted to patient care services, exclusive of separately billable procedures, described in this note is 30 minutes.  Sherrilyn Rist, MD [place] [facility] Cell: [phone]

## 2018-07-19 NOTE — Progress Notes (Signed)
  Made MD Deterding aware of the Pt's increase work of breathing and use of accessory muscles, the pt is currently tachypneic with RR in the 40's. Orders received to place [facility] for urine retention, RT at the bed side, the pt has been repositioned, an 650 mg of Tylenol and 1 mg of Ativan given. No new orders given at this time, I will continue to monitor.

## 2018-07-19 NOTE — Progress Notes (Signed)
Spoke with patient's Sister Everlene Other  Discussed ongoing care  Decision made not to intubate Patient continues to do poorly Continue lines of care She will reach out to other family members and give [name] a call tomorrow  Comfort measures discussed

## 2018-07-19 DEATH — deceased

## 2018-07-26 ENCOUNTER — Ambulatory Visit: Payer: Self-pay | Admitting: Neurology

## 2018-09-22 ENCOUNTER — Telehealth: Payer: Self-pay | Admitting: Cardiology

## 2020-03-11 IMAGING — MR MR HEAD WO/W CM
13 series · 48 of 48 positions shown · IV contrast (multihance)
Comparison: CT head 03/20/2015

CLINICAL DATA: Expressive aphasia

EXAM:
MRI HEAD WITHOUT AND WITH CONTRAST
TECHNIQUE: Multiplanar, multiecho pulse sequences of the brain and surrounding
structures were obtained without and with intravenous contrast.
CONTRAST:  15mL MULTIHANCE GADOBENATE DIMEGLUMINE 529 MG/ML IV SOLN

[Series 2: T1 · sagittal · 5.0mm · 0.45mm/px · 1 of 26 slices shown]
[im 1/26]
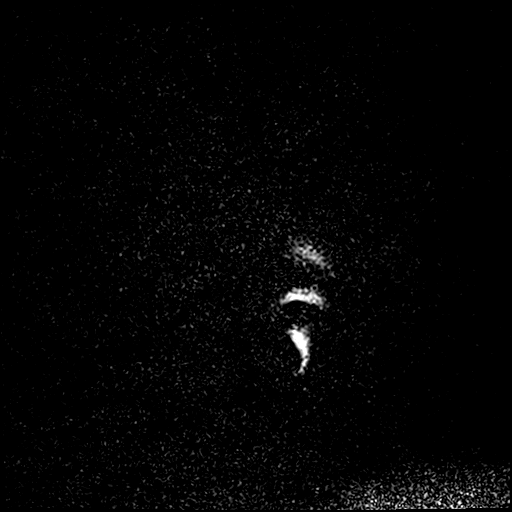

[Series 3: DWI · axial · 3.0mm · 1.80mm/px · z∈[-69,+87]mm · 6 of 106 slices shown (1 of 4)]
[im 1/106]
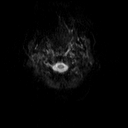
[im 22/106]
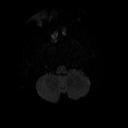
[im 43/106]
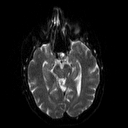
[im 64/106]
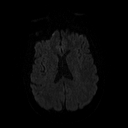
[im 85/106]
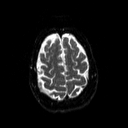
[im 106/106]
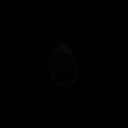

[Series 4: DWI · axial · 3.0mm · 1.80mm/px · z∈[-69,+87]mm · 3 of 44 slices shown (2 of 4)]
[im 1/44]
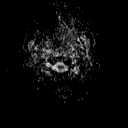
[im 22/44]
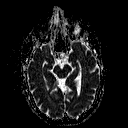
[im 44/44]
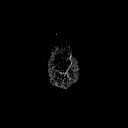

[Series 5: DWI · coronal · 5.0mm · 1.80mm/px · 5 of 84 slices shown (3 of 4)]
[im 1/84]
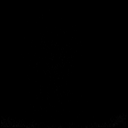
[im 21/84]
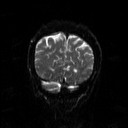
[im 42/84]
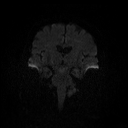
[im 63/84]
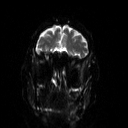
[im 84/84]
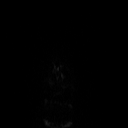

[Series 6: DWI · coronal · 5.0mm · 1.80mm/px · 3 of 42 slices shown (4 of 4)]
[im 1/42]
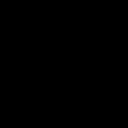
[im 21/42]
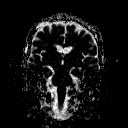
[im 42/42]
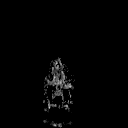

[Series 7: T2 · axial · 5.0mm · 0.51mm/px · z∈[-74,+88]mm · 2 of 25 slices shown (1 of 2)]
[im 1/25]
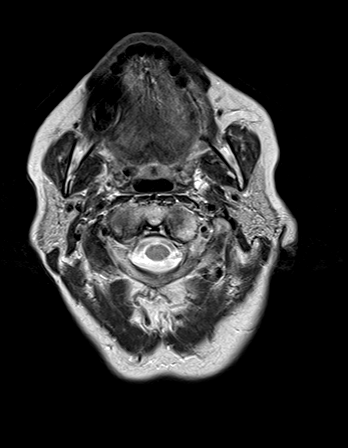
[im 25/25]
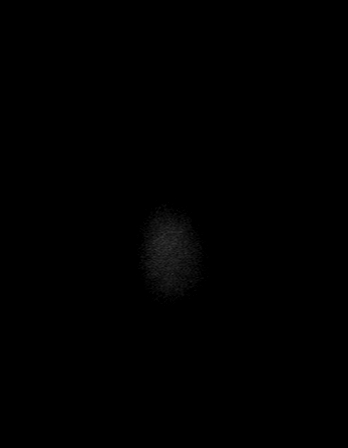

[Series 8: FLAIR · axial · 3.0mm · 0.45mm/px · z∈[-70,+83]mm · 2 of 34 slices shown]
[im 1/34]
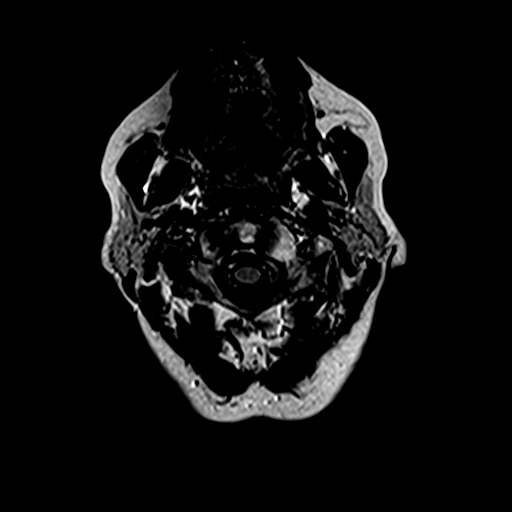
[im 34/34]
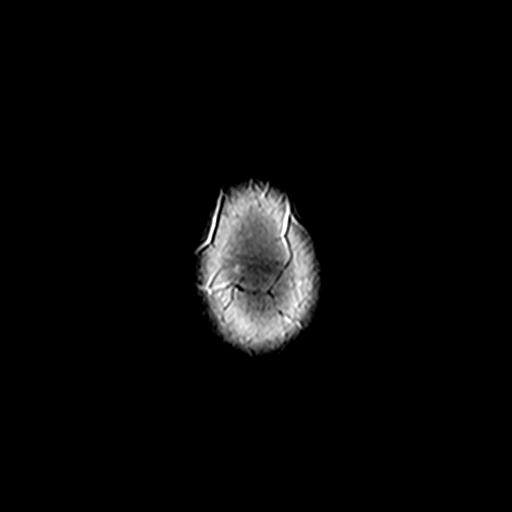

[Series 10: swi_images · axial · 5.0mm · 0.90mm/px · z∈[-63,+82]mm · 2 of 30 slices shown]
[im 1/30]
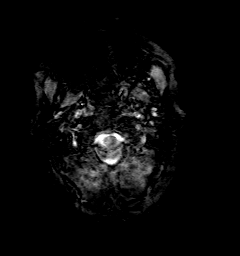
[im 30/30]
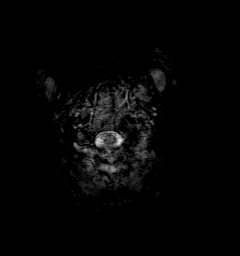

[Series 11: t1_mpr_tra · axial · 1.0mm · 0.75mm/px · z∈[-59,+84]mm · 9 of 144 slices shown (1 of 2)]
[im 1/144]
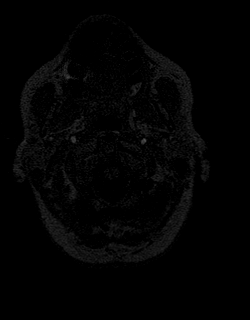
[im 18/144]
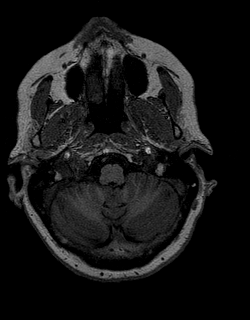
[im 36/144]
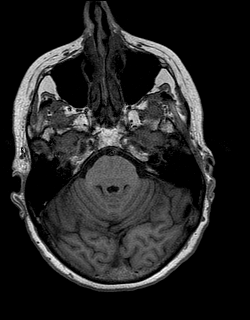
[im 54/144]
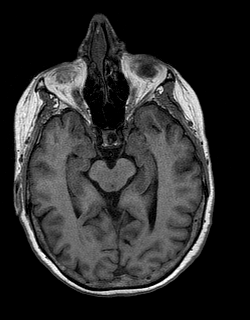
[im 72/144]
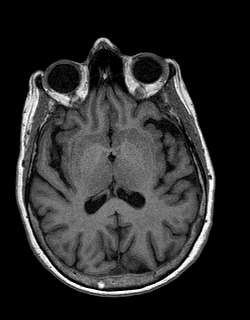
[im 90/144]
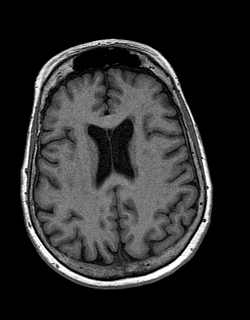
[im 108/144]
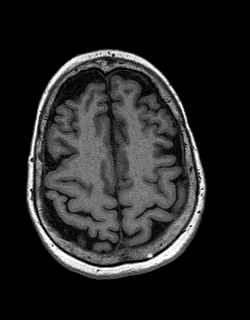
[im 126/144]
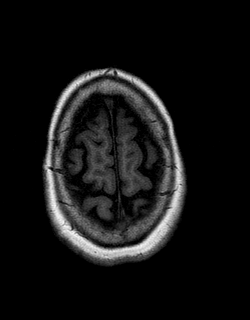
[im 144/144]
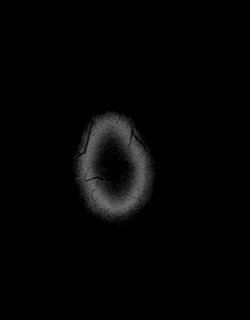

[Series 12: T2 · coronal · 5.0mm · 0.45mm/px · 2 of 30 slices shown (2 of 2)]
[im 1/30]
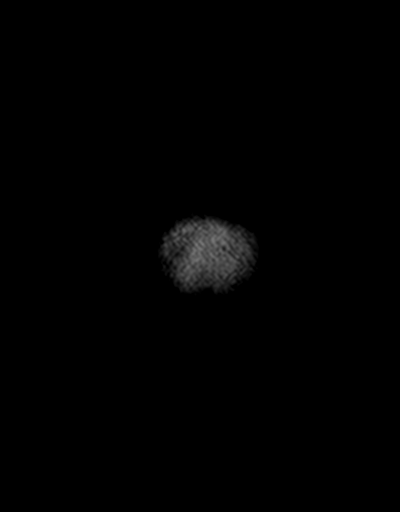
[im 30/30]
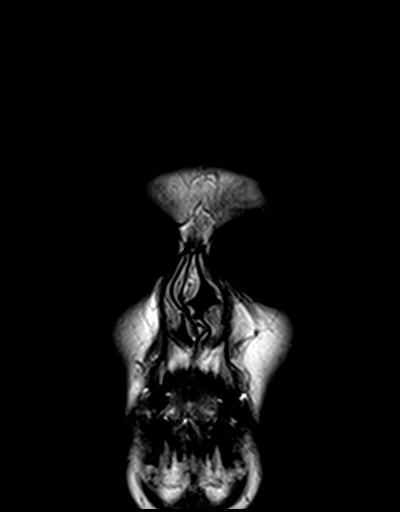

[Series 13: t1_mpr_tra · axial · 1.0mm · 0.75mm/px · z∈[-59,+84]mm · 9 of 144 slices shown (2 of 2)]
[im 1/144]
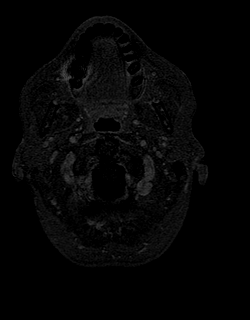
[im 18/144]
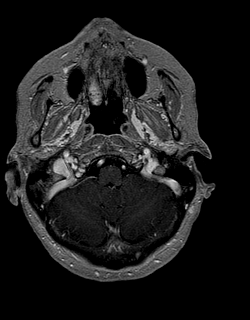
[im 36/144]
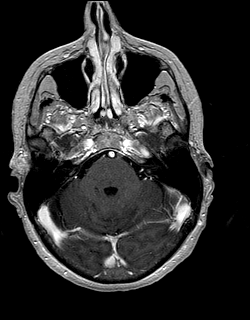
[im 54/144]
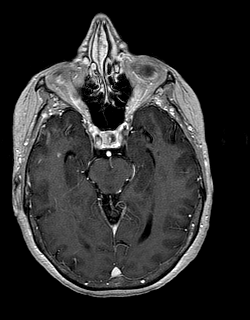
[im 72/144]
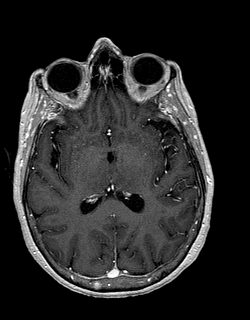
[im 90/144]
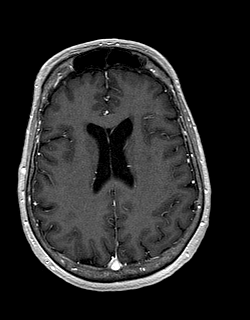
[im 108/144]
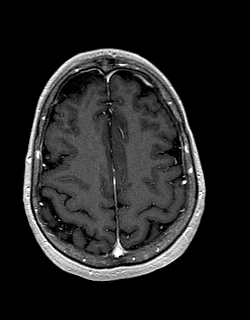
[im 126/144]
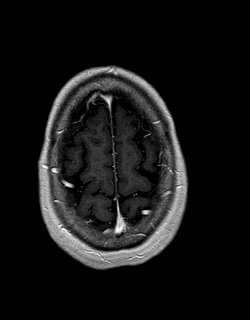
[im 144/144]
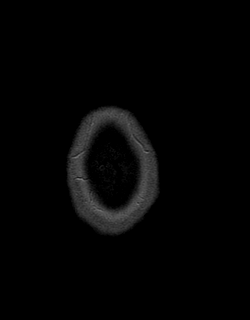

[Series 14: post cor · coronal · 5.0mm · 0.45mm/px · 2 of 30 slices shown]
[im 1/30]
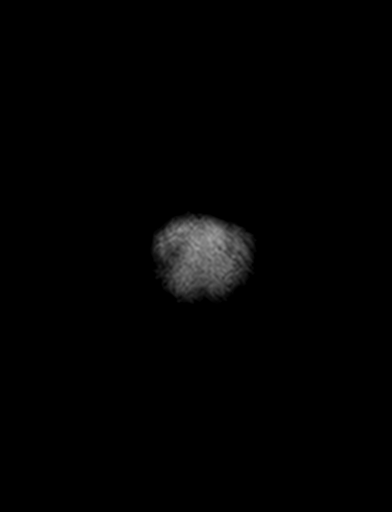
[im 30/30]
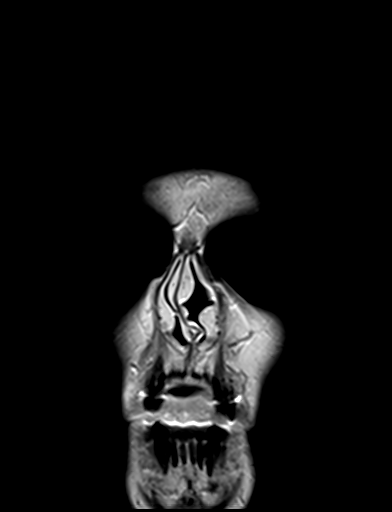

[Series 15: post sag (optional · sagittal · 5.0mm · 0.45mm/px · 2 of 26 slices shown]
[im 1/26]
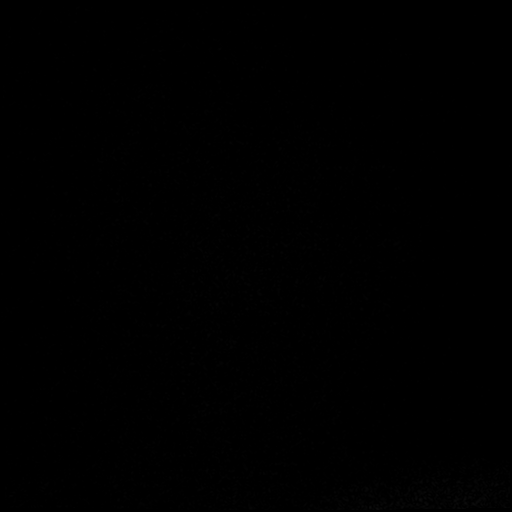
[im 26/26]
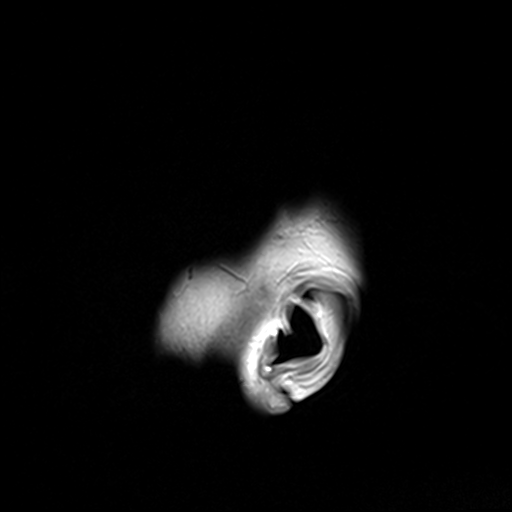

[48 of 48 positions shown; findings below may reference images not displayed]

FINDINGS: Brain: Ventricle size and cerebral volume normal. Negative for acute
infarct. Scattered small white matter hyperintensities bilaterally
compatible with chronic microvascular ischemia. Negative for
hemorrhage or mass. Normal enhancement following contrast infusion.

Vascular: Normal arterial flow voids

Skull and upper cervical spine: Negative

Sinuses/Orbits: Mild mucosal edema paranasal sinuses and mastoid
sinus bilaterally. Bilateral cataract surgery

Other: None
IMPRESSION: No acute abnormality. Mild chronic white matter changes most
consistent with microvascular ischemia.
